# Patient Record
Sex: Female | Born: 1945 | Race: White | Hispanic: No | Marital: Married | Smoking: Former smoker
Health system: Southern US, Community
[De-identification: ages and names within clinical notes are randomized; demographics above are authoritative.]

## PROBLEM LIST (undated history)

## (undated) DIAGNOSIS — I639 Cerebral infarction, unspecified: Secondary | ICD-10-CM

## (undated) DIAGNOSIS — I10 Essential (primary) hypertension: Secondary | ICD-10-CM

## (undated) DIAGNOSIS — C801 Malignant (primary) neoplasm, unspecified: Secondary | ICD-10-CM

## (undated) DIAGNOSIS — E119 Type 2 diabetes mellitus without complications: Secondary | ICD-10-CM

## (undated) DIAGNOSIS — F32A Depression, unspecified: Secondary | ICD-10-CM

## (undated) DIAGNOSIS — M1990 Unspecified osteoarthritis, unspecified site: Secondary | ICD-10-CM

## (undated) DIAGNOSIS — E7800 Pure hypercholesterolemia, unspecified: Secondary | ICD-10-CM

## (undated) DIAGNOSIS — F329 Major depressive disorder, single episode, unspecified: Secondary | ICD-10-CM

## (undated) DIAGNOSIS — I499 Cardiac arrhythmia, unspecified: Secondary | ICD-10-CM

## (undated) HISTORY — PX: ABDOMINAL HYSTERECTOMY: SHX81

## (undated) HISTORY — PX: TUBAL LIGATION: SHX77

## (undated) HISTORY — PX: OTHER SURGICAL HISTORY: SHX169

## (undated) HISTORY — PX: APPENDECTOMY: SHX54

## (undated) HISTORY — PX: BUNIONECTOMY: SHX129

## (undated) HISTORY — PX: FRACTURE SURGERY: SHX138

---

## 2004-12-23 ENCOUNTER — Ambulatory Visit: Payer: Self-pay | Admitting: General Practice

## 2005-09-05 ENCOUNTER — Emergency Department: Payer: Self-pay | Admitting: Emergency Medicine

## 2005-09-19 ENCOUNTER — Emergency Department: Payer: Self-pay | Admitting: Emergency Medicine

## 2006-04-09 ENCOUNTER — Ambulatory Visit: Payer: Self-pay | Admitting: Family Medicine

## 2007-04-23 ENCOUNTER — Ambulatory Visit: Payer: Self-pay | Admitting: General Practice

## 2007-05-12 ENCOUNTER — Other Ambulatory Visit: Payer: Self-pay

## 2007-05-12 ENCOUNTER — Inpatient Hospital Stay: Payer: Self-pay | Admitting: Internal Medicine

## 2007-05-13 ENCOUNTER — Other Ambulatory Visit: Payer: Self-pay

## 2008-08-30 ENCOUNTER — Ambulatory Visit: Payer: Self-pay | Admitting: Obstetrics and Gynecology

## 2008-10-06 ENCOUNTER — Ambulatory Visit: Payer: Self-pay | Admitting: Unknown Physician Specialty

## 2009-01-05 ENCOUNTER — Ambulatory Visit: Payer: Self-pay | Admitting: Gynecologic Oncology

## 2009-01-23 ENCOUNTER — Ambulatory Visit: Payer: Self-pay | Admitting: Gynecologic Oncology

## 2009-02-05 ENCOUNTER — Ambulatory Visit: Payer: Self-pay | Admitting: Gynecologic Oncology

## 2009-02-13 ENCOUNTER — Ambulatory Visit: Payer: Self-pay | Admitting: Gynecologic Oncology

## 2009-03-07 ENCOUNTER — Ambulatory Visit: Payer: Self-pay | Admitting: Gynecologic Oncology

## 2009-04-03 ENCOUNTER — Ambulatory Visit: Payer: Self-pay | Admitting: Gynecologic Oncology

## 2009-07-06 ENCOUNTER — Ambulatory Visit: Payer: Self-pay | Admitting: Gynecologic Oncology

## 2009-08-05 ENCOUNTER — Ambulatory Visit: Payer: Self-pay | Admitting: Gynecologic Oncology

## 2009-09-13 ENCOUNTER — Ambulatory Visit: Payer: Self-pay

## 2010-05-28 ENCOUNTER — Ambulatory Visit: Payer: Self-pay | Admitting: Anesthesiology

## 2010-05-31 ENCOUNTER — Ambulatory Visit: Payer: Self-pay | Admitting: General Practice

## 2010-06-04 LAB — PATHOLOGY REPORT

## 2010-10-15 ENCOUNTER — Ambulatory Visit: Payer: Self-pay

## 2011-11-05 ENCOUNTER — Ambulatory Visit: Payer: Self-pay

## 2011-11-25 ENCOUNTER — Ambulatory Visit: Payer: Self-pay | Admitting: Otolaryngology

## 2011-12-01 ENCOUNTER — Ambulatory Visit: Payer: Self-pay | Admitting: Otolaryngology

## 2011-12-03 ENCOUNTER — Ambulatory Visit: Payer: Self-pay | Admitting: Otolaryngology

## 2011-12-04 ENCOUNTER — Ambulatory Visit: Payer: Self-pay | Admitting: Internal Medicine

## 2011-12-15 ENCOUNTER — Ambulatory Visit: Payer: Self-pay | Admitting: Internal Medicine

## 2011-12-15 LAB — CBC CANCER CENTER
Basophil #: 0 x10 3/mm (ref 0.0–0.1)
Eosinophil #: 0.1 x10 3/mm (ref 0.0–0.7)
HCT: 40.5 % (ref 35.0–47.0)
Lymphocyte #: 1 x10 3/mm (ref 1.0–3.6)
Lymphocyte %: 23.7 %
MCHC: 32 g/dL (ref 32.0–36.0)
MCV: 92 fL (ref 80–100)
Monocyte #: 0.6 x10 3/mm (ref 0.2–0.9)
Neutrophil #: 2.6 x10 3/mm (ref 1.4–6.5)
RDW: 13.8 % (ref 11.5–14.5)

## 2011-12-15 LAB — COMPREHENSIVE METABOLIC PANEL
Bilirubin,Total: 0.7 mg/dL (ref 0.2–1.0)
Calcium, Total: 9.3 mg/dL (ref 8.5–10.1)
Co2: 32 mmol/L (ref 21–32)
Potassium: 4 mmol/L (ref 3.5–5.1)
Sodium: 141 mmol/L (ref 136–145)
Total Protein: 7.3 g/dL (ref 6.4–8.2)

## 2011-12-15 LAB — LACTATE DEHYDROGENASE: LDH: 444 U/L — ABNORMAL HIGH (ref 81–234)

## 2011-12-18 ENCOUNTER — Ambulatory Visit: Payer: Self-pay | Admitting: Internal Medicine

## 2012-01-06 ENCOUNTER — Ambulatory Visit: Payer: Self-pay | Admitting: Internal Medicine

## 2012-01-12 ENCOUNTER — Ambulatory Visit: Payer: Self-pay | Admitting: Surgery

## 2012-01-14 LAB — COMPREHENSIVE METABOLIC PANEL
Anion Gap: 12 (ref 7–16)
BUN: 16 mg/dL (ref 7–18)
Calcium, Total: 9.5 mg/dL (ref 8.5–10.1)
Chloride: 99 mmol/L (ref 98–107)
Co2: 26 mmol/L (ref 21–32)
EGFR (African American): >60
EGFR (Non-African Amer.): >60
Glucose: 258 mg/dL — ABNORMAL HIGH (ref 65–99)
Potassium: 3.9 mmol/L (ref 3.5–5.1)
SGOT(AST): 30 U/L (ref 15–37)
SGPT (ALT): 21 U/L (ref 12–78)
Sodium: 137 mmol/L (ref 136–145)
Total Protein: 7 g/dL (ref 6.4–8.2)

## 2012-01-14 LAB — CBC CANCER CENTER
Basophil #: 0 x10 3/mm (ref 0.0–0.1)
Eosinophil %: 1.2 %
Lymphocyte #: 1 x10 3/mm (ref 1.0–3.6)
MCH: 28.8 pg (ref 26.0–34.0)
MCV: 91 fL (ref 80–100)
Monocyte %: 14.4 %
Neutrophil %: 69.8 %
Platelet: 179 x10 3/mm (ref 150–440)
RBC: 4.55 10*6/uL (ref 3.80–5.20)
RDW: 14.2 % (ref 11.5–14.5)
WBC: 6.9 x10 3/mm (ref 3.6–11.0)

## 2012-01-14 LAB — LACTATE DEHYDROGENASE: LDH: 928 U/L — ABNORMAL HIGH (ref 81–246)

## 2012-01-15 LAB — BASIC METABOLIC PANEL
BUN: 24 mg/dL — ABNORMAL HIGH (ref 7–18)
Creatinine: 0.78 mg/dL (ref 0.60–1.30)
EGFR (African American): >60
EGFR (Non-African Amer.): >60
Glucose: 432 mg/dL — ABNORMAL HIGH (ref 65–99)
Osmolality: 291 (ref 275–301)
Potassium: 4.8 mmol/L (ref 3.5–5.1)
Sodium: 134 mmol/L — ABNORMAL LOW (ref 136–145)

## 2012-01-15 LAB — PHOSPHORUS: Phosphorus: 3.2 mg/dL (ref 2.5–4.9)

## 2012-01-15 LAB — URIC ACID: Uric Acid: 2.5 mg/dL — ABNORMAL LOW (ref 2.6–6.0)

## 2012-01-16 LAB — BASIC METABOLIC PANEL
Anion Gap: 10 (ref 7–16)
BUN: 23 mg/dL — ABNORMAL HIGH (ref 7–18)
Calcium, Total: 9.5 mg/dL (ref 8.5–10.1)
Co2: 26 mmol/L (ref 21–32)
Creatinine: 0.92 mg/dL (ref 0.60–1.30)
EGFR (African American): >60
EGFR (Non-African Amer.): >60
Glucose: 384 mg/dL — ABNORMAL HIGH (ref 65–99)
Sodium: 133 mmol/L — ABNORMAL LOW (ref 136–145)

## 2012-01-16 LAB — URIC ACID: Uric Acid: 2.5 mg/dL — ABNORMAL LOW (ref 2.6–6.0)

## 2012-01-16 LAB — PHOSPHORUS: Phosphorus: 3 mg/dL (ref 2.5–4.9)

## 2012-01-19 LAB — BASIC METABOLIC PANEL
Anion Gap: 10 (ref 7–16)
BUN: 22 mg/dL — ABNORMAL HIGH (ref 7–18)
Calcium, Total: 8.5 mg/dL (ref 8.5–10.1)
Chloride: 96 mmol/L — ABNORMAL LOW (ref 98–107)
Co2: 28 mmol/L (ref 21–32)
Creatinine: 0.9 mg/dL (ref 0.60–1.30)
EGFR (African American): >60
Osmolality: 288 (ref 275–301)

## 2012-01-19 LAB — CBC CANCER CENTER
Basophil %: 0.7 %
Eosinophil #: 0 x10 3/mm (ref 0.0–0.7)
Eosinophil %: 0.2 %
HCT: 37.3 % (ref 35.0–47.0)
HGB: 11.8 g/dL — ABNORMAL LOW (ref 12.0–16.0)
Lymphocyte #: 0.8 x10 3/mm — ABNORMAL LOW (ref 1.0–3.6)
MCH: 29 pg (ref 26.0–34.0)
MCV: 91 fL (ref 80–100)
Monocyte #: 0.1 x10 3/mm — ABNORMAL LOW (ref 0.2–0.9)
Monocyte %: 1.1 %
Neutrophil #: 13 x10 3/mm — ABNORMAL HIGH (ref 1.4–6.5)
Platelet: 68 x10 3/mm — ABNORMAL LOW (ref 150–440)
RBC: 4.08 10*6/uL (ref 3.80–5.20)

## 2012-01-21 LAB — CBC CANCER CENTER
Basophil %: 1 %
Eosinophil #: 0.1 x10 3/mm (ref 0.0–0.7)
Eosinophil %: 12 %
HCT: 38.2 % (ref 35.0–47.0)
Lymphocyte #: 0.4 x10 3/mm — ABNORMAL LOW (ref 1.0–3.6)
Lymphocyte %: 68.4 %
MCH: 29 pg (ref 26.0–34.0)
MCV: 91 fL (ref 80–100)
Monocyte #: 0.1 x10 3/mm — ABNORMAL LOW (ref 0.2–0.9)
Monocyte %: 14 %
Neutrophil #: 0 x10 3/mm — ABNORMAL LOW (ref 1.4–6.5)
Neutrophil %: 4.6 %
Platelet: 42 x10 3/mm — ABNORMAL LOW (ref 150–440)
RBC: 4.18 10*6/uL (ref 3.80–5.20)
RDW: 13.8 % (ref 11.5–14.5)
WBC: 0.5 x10 3/mm — CL (ref 3.6–11.0)

## 2012-01-22 ENCOUNTER — Emergency Department: Payer: Self-pay | Admitting: Unknown Physician Specialty

## 2012-01-22 LAB — BASIC METABOLIC PANEL
BUN: 14 mg/dL (ref 7–18)
Calcium, Total: 8.9 mg/dL (ref 8.5–10.1)
Chloride: 98 mmol/L (ref 98–107)
Co2: 29 mmol/L (ref 21–32)
Creatinine: 0.93 mg/dL (ref 0.60–1.30)
EGFR (Non-African Amer.): >60
Glucose: 354 mg/dL — ABNORMAL HIGH (ref 65–99)
Potassium: 3.8 mmol/L (ref 3.5–5.1)
Sodium: 137 mmol/L (ref 136–145)

## 2012-01-22 LAB — CBC CANCER CENTER
Basophil #: 0 x10 3/mm (ref 0.0–0.1)
Eosinophil #: 0.1 x10 3/mm (ref 0.0–0.7)
Eosinophil %: 8.2 %
HCT: 38.3 % (ref 35.0–47.0)
Lymphocyte #: 0.4 x10 3/mm — ABNORMAL LOW (ref 1.0–3.6)
Lymphocyte %: 38.9 %
MCH: 29 pg (ref 26.0–34.0)
MCHC: 31.9 g/dL — ABNORMAL LOW (ref 32.0–36.0)
MCV: 91 fL (ref 80–100)
Neutrophil %: 24 %
Platelet: 42 x10 3/mm — ABNORMAL LOW (ref 150–440)
RBC: 4.21 10*6/uL (ref 3.80–5.20)

## 2012-01-22 LAB — CBC WITH DIFFERENTIAL/PLATELET
Bands: 8 %
HGB: 11.9 g/dL — ABNORMAL LOW (ref 12.0–16.0)
MCH: 29.9 pg (ref 26.0–34.0)
MCHC: 33.3 g/dL (ref 32.0–36.0)
Platelet: 43 10*3/uL — ABNORMAL LOW (ref 150–440)
RDW: 13.7 % (ref 11.5–14.5)
Segmented Neutrophils: 19 %

## 2012-01-22 LAB — COMPREHENSIVE METABOLIC PANEL
Albumin: 2.6 g/dL — ABNORMAL LOW (ref 3.4–5.0)
Anion Gap: 11 (ref 7–16)
BUN: 12 mg/dL (ref 7–18)
Calcium, Total: 8.6 mg/dL (ref 8.5–10.1)
Creatinine: 0.75 mg/dL (ref 0.60–1.30)
EGFR (African American): >60
EGFR (Non-African Amer.): >60
Glucose: 266 mg/dL — ABNORMAL HIGH (ref 65–99)
Osmolality: 288 (ref 275–301)
Potassium: 3.5 mmol/L (ref 3.5–5.1)
SGPT (ALT): 14 U/L (ref 12–78)
Sodium: 140 mmol/L (ref 136–145)
Total Protein: 5.4 g/dL — ABNORMAL LOW (ref 6.4–8.2)

## 2012-01-22 LAB — MAGNESIUM: Magnesium: 1.3 mg/dL — ABNORMAL LOW

## 2012-01-22 LAB — TSH: Thyroid Stimulating Horm: 0.526 u[IU]/mL

## 2012-01-22 LAB — PRO B NATRIURETIC PEPTIDE: B-Type Natriuretic Peptide: 76 pg/mL (ref 0–125)

## 2012-01-22 LAB — PROTIME-INR: INR: 1.1

## 2012-01-22 LAB — APTT: Activated PTT: 27.5 secs (ref 23.6–35.9)

## 2012-01-22 LAB — TROPONIN I: Troponin-I: <0.02 ng/mL

## 2012-01-22 LAB — LIPASE, BLOOD: Lipase: 179 U/L (ref 73–393)

## 2012-01-23 LAB — CBC CANCER CENTER
Basophil #: 0 x10 3/mm (ref 0.0–0.1)
Basophil %: 0.3 %
Eosinophil #: 0.1 x10 3/mm (ref 0.0–0.7)
Eosinophil %: 2.5 %
HGB: 12.6 g/dL (ref 12.0–16.0)
Lymphocyte %: 15.1 %
Neutrophil %: 72.5 %
RBC: 4.41 10*6/uL (ref 3.80–5.20)
WBC: 3.7 x10 3/mm (ref 3.6–11.0)

## 2012-01-23 LAB — MAGNESIUM: Magnesium: 1.5 mg/dL — ABNORMAL LOW

## 2012-01-26 LAB — CREATININE, SERUM
Creatinine: 0.9 mg/dL (ref 0.60–1.30)
EGFR (African American): >60
EGFR (Non-African Amer.): >60

## 2012-01-26 LAB — CBC CANCER CENTER
Basophil #: 0 x10 3/mm (ref 0.0–0.1)
Basophil %: 0.5 %
Eosinophil #: 0.1 x10 3/mm (ref 0.0–0.7)
Eosinophil %: 0.7 %
HCT: 37.5 % (ref 35.0–47.0)
HGB: 11.8 g/dL — ABNORMAL LOW (ref 12.0–16.0)
Lymphocyte #: 0.8 x10 3/mm — ABNORMAL LOW (ref 1.0–3.6)
Lymphocyte %: 10.5 %
MCH: 28.6 pg (ref 26.0–34.0)
MCHC: 31.4 g/dL — ABNORMAL LOW (ref 32.0–36.0)
Monocyte #: 1 x10 3/mm — ABNORMAL HIGH (ref 0.2–0.9)
Monocyte %: 12.8 %
Neutrophil #: 5.9 x10 3/mm (ref 1.4–6.5)
Neutrophil %: 75.5 %
Platelet: 169 x10 3/mm (ref 150–440)
RBC: 4.12 10*6/uL (ref 3.80–5.20)
WBC: 7.8 x10 3/mm (ref 3.6–11.0)

## 2012-01-26 LAB — POTASSIUM: Potassium: 4.1 mmol/L (ref 3.5–5.1)

## 2012-01-26 LAB — MAGNESIUM: Magnesium: 1.8 mg/dL

## 2012-02-04 LAB — CBC CANCER CENTER
Basophil #: 0.1 x10 3/mm (ref 0.0–0.1)
Basophil %: 1.3 %
Eosinophil #: 0 x10 3/mm (ref 0.0–0.7)
HCT: 36.9 % (ref 35.0–47.0)
HGB: 11.7 g/dL — ABNORMAL LOW (ref 12.0–16.0)
Lymphocyte %: 18.8 %
MCHC: 31.7 g/dL — ABNORMAL LOW (ref 32.0–36.0)
Monocyte #: 1 x10 3/mm — ABNORMAL HIGH (ref 0.2–0.9)
Monocyte %: 20.6 %
Neutrophil #: 2.9 x10 3/mm (ref 1.4–6.5)
Neutrophil %: 58.6 %
Platelet: 341 x10 3/mm (ref 150–440)
RBC: 4.04 10*6/uL (ref 3.80–5.20)
WBC: 5 x10 3/mm (ref 3.6–11.0)

## 2012-02-04 LAB — MAGNESIUM: Magnesium: 1.7 mg/dL — ABNORMAL LOW

## 2012-02-04 LAB — COMPREHENSIVE METABOLIC PANEL
Anion Gap: 12 (ref 7–16)
Calcium, Total: 8.8 mg/dL (ref 8.5–10.1)
Chloride: 100 mmol/L (ref 98–107)
Co2: 25 mmol/L (ref 21–32)
EGFR (African American): >60
Potassium: 4 mmol/L (ref 3.5–5.1)
SGOT(AST): 16 U/L (ref 15–37)
SGPT (ALT): 25 U/L (ref 12–78)

## 2012-02-04 LAB — LACTATE DEHYDROGENASE: LDH: 203 U/L (ref 81–246)

## 2012-02-06 ENCOUNTER — Ambulatory Visit: Payer: Self-pay | Admitting: Internal Medicine

## 2012-02-06 LAB — CBC CANCER CENTER
Basophil #: 0.1 x10 3/mm (ref 0.0–0.1)
Basophil %: 1.2 %
Eosinophil %: 0.5 %
HCT: 34.7 % — ABNORMAL LOW (ref 35.0–47.0)
HGB: 11.1 g/dL — ABNORMAL LOW (ref 12.0–16.0)
Lymphocyte #: 0.6 x10 3/mm — ABNORMAL LOW (ref 1.0–3.6)
MCHC: 31.9 g/dL — ABNORMAL LOW (ref 32.0–36.0)
MCV: 92 fL (ref 80–100)
Monocyte #: 0.9 x10 3/mm (ref 0.2–0.9)
Monocyte %: 18.9 %
Neutrophil #: 3.2 x10 3/mm (ref 1.4–6.5)
Neutrophil %: 66.3 %
Platelet: 309 x10 3/mm (ref 150–440)
RBC: 3.79 10*6/uL — ABNORMAL LOW (ref 3.80–5.20)
WBC: 4.8 x10 3/mm (ref 3.6–11.0)

## 2012-02-12 LAB — BASIC METABOLIC PANEL
Anion Gap: 12 (ref 7–16)
BUN: 22 mg/dL — ABNORMAL HIGH (ref 7–18)
Calcium, Total: 8.4 mg/dL — ABNORMAL LOW (ref 8.5–10.1)
Co2: 25 mmol/L (ref 21–32)
EGFR (African American): >60
Glucose: 321 mg/dL — ABNORMAL HIGH (ref 65–99)
Potassium: 3.6 mmol/L (ref 3.5–5.1)
Sodium: 136 mmol/L (ref 136–145)

## 2012-02-12 LAB — CBC CANCER CENTER
Basophil %: 0.5 %
Eosinophil #: 0 x10 3/mm (ref 0.0–0.7)
Eosinophil %: 0.3 %
HGB: 11.7 g/dL — ABNORMAL LOW (ref 12.0–16.0)
Lymphocyte %: 20.6 %
MCH: 28.8 pg (ref 26.0–34.0)
MCHC: 31.7 g/dL — ABNORMAL LOW (ref 32.0–36.0)
MCV: 91 fL (ref 80–100)
Monocyte #: 0 x10 3/mm — ABNORMAL LOW (ref 0.2–0.9)
Neutrophil %: 76.8 %
Platelet: 26 x10 3/mm — CL (ref 150–440)
RBC: 4.07 10*6/uL (ref 3.80–5.20)
RDW: 15.6 % — ABNORMAL HIGH (ref 11.5–14.5)
WBC: 2.4 x10 3/mm — ABNORMAL LOW (ref 3.6–11.0)

## 2012-02-12 LAB — MAGNESIUM: Magnesium: 1.6 mg/dL — ABNORMAL LOW

## 2012-02-13 LAB — CBC CANCER CENTER
Basophil #: 0 x10 3/mm (ref 0.0–0.1)
Basophil %: 0.2 %
HCT: 34.8 % — ABNORMAL LOW (ref 35.0–47.0)
HGB: 10.9 g/dL — ABNORMAL LOW (ref 12.0–16.0)
Lymphocyte #: 0.3 x10 3/mm — ABNORMAL LOW (ref 1.0–3.6)
Lymphocyte %: 64 %
MCHC: 31.5 g/dL — ABNORMAL LOW (ref 32.0–36.0)
MCV: 92 fL (ref 80–100)
Monocyte %: 22.6 %
Neutrophil #: 0 x10 3/mm — ABNORMAL LOW (ref 1.4–6.5)
Platelet: 13 x10 3/mm — CL (ref 150–440)
RDW: 15.6 % — ABNORMAL HIGH (ref 11.5–14.5)
WBC: 0.5 x10 3/mm — CL (ref 3.6–11.0)

## 2012-02-14 ENCOUNTER — Ambulatory Visit: Payer: Self-pay | Admitting: Internal Medicine

## 2012-02-14 LAB — PLATELET COUNT: Platelet: 26 10*3/uL — CL (ref 150–440)

## 2012-02-15 ENCOUNTER — Other Ambulatory Visit: Payer: Self-pay | Admitting: Internal Medicine

## 2012-02-15 LAB — CBC WITH DIFFERENTIAL/PLATELET
Basophil #: 0 10*3/uL (ref 0.0–0.1)
Basophil %: 0.5 %
Eosinophil #: 0.1 10*3/uL (ref 0.0–0.7)
HCT: 33.7 % — ABNORMAL LOW (ref 35.0–47.0)
HGB: 11.1 g/dL — ABNORMAL LOW (ref 12.0–16.0)
Lymphocyte %: 13.5 %
MCH: 29.6 pg (ref 26.0–34.0)
MCHC: 33 g/dL (ref 32.0–36.0)
Monocyte %: 12 %
Neutrophil #: 3.2 10*3/uL (ref 1.4–6.5)
Neutrophil %: 71.3 %
Platelet: 31 10*3/uL — ABNORMAL LOW (ref 150–440)
WBC: 4.5 10*3/uL (ref 3.6–11.0)

## 2012-02-16 LAB — CBC CANCER CENTER
Basophil #: 0 x10 3/mm (ref 0.0–0.1)
Basophil %: 0.5 %
HGB: 11.1 g/dL — ABNORMAL LOW (ref 12.0–16.0)
Lymphocyte #: 0.6 x10 3/mm — ABNORMAL LOW (ref 1.0–3.6)
Lymphocyte %: 11.3 %
MCH: 29 pg (ref 26.0–34.0)
MCHC: 31.6 g/dL — ABNORMAL LOW (ref 32.0–36.0)
MCV: 92 fL (ref 80–100)
Neutrophil #: 4.2 x10 3/mm (ref 1.4–6.5)
Neutrophil %: 73.8 %
Platelet: 48 x10 3/mm — ABNORMAL LOW (ref 150–440)
RBC: 3.83 10*6/uL (ref 3.80–5.20)
RDW: 15.4 % — ABNORMAL HIGH (ref 11.5–14.5)

## 2012-02-22 ENCOUNTER — Inpatient Hospital Stay: Payer: Self-pay | Admitting: Internal Medicine

## 2012-02-22 LAB — COMPREHENSIVE METABOLIC PANEL
Albumin: 3 g/dL — ABNORMAL LOW (ref 3.4–5.0)
Anion Gap: 6 — ABNORMAL LOW (ref 7–16)
BUN: 9 mg/dL (ref 7–18)
Calcium, Total: 8.2 mg/dL — ABNORMAL LOW (ref 8.5–10.1)
Glucose: 520 mg/dL (ref 65–99)
Osmolality: 298 (ref 275–301)
Potassium: 4 mmol/L (ref 3.5–5.1)
SGOT(AST): 20 U/L (ref 15–37)
Sodium: 138 mmol/L (ref 136–145)
Total Protein: 5.8 g/dL — ABNORMAL LOW (ref 6.4–8.2)

## 2012-02-22 LAB — CBC WITH DIFFERENTIAL/PLATELET
Basophil %: 0.9 %
Eosinophil #: 0 10*3/uL (ref 0.0–0.7)
HCT: 31.8 % — ABNORMAL LOW (ref 35.0–47.0)
HGB: 11.1 g/dL — ABNORMAL LOW (ref 12.0–16.0)
Lymphocyte %: 7.5 %
MCHC: 34.9 g/dL (ref 32.0–36.0)
Monocyte %: 14.6 %
Neutrophil #: 3.9 10*3/uL (ref 1.4–6.5)
WBC: 5.1 10*3/uL (ref 3.6–11.0)

## 2012-02-22 LAB — URINE PH
Ph: 5 (ref 4.5–8.0)
Ph: 7 (ref 4.5–8.0)

## 2012-02-22 LAB — MAGNESIUM: Magnesium: 2 mg/dL

## 2012-02-23 LAB — URINE PH
Ph: 7 (ref 4.5–8.0)
Ph: 9 (ref 4.5–8.0)
Ph: 9 (ref 4.5–8.0)
Ph: 8 (ref 4.5–8.0)
Ph: 8 (ref 4.5–8.0)

## 2012-02-23 LAB — CREATININE, SERUM: EGFR (African American): >60

## 2012-02-24 LAB — URINE PH
Ph: 7 (ref 4.5–8.0)
Ph: 8 (ref 4.5–8.0)
Ph: 8 (ref 4.5–8.0)
Ph: 7 (ref 4.5–8.0)
Ph: 7 (ref 4.5–8.0)
Ph: 6 (ref 4.5–8.0)
Ph: 6 (ref 4.5–8.0)
Ph: 8 (ref 4.5–8.0)

## 2012-02-24 LAB — BASIC METABOLIC PANEL
BUN: 10 mg/dL (ref 7–18)
Chloride: 107 mmol/L (ref 98–107)
Co2: 27 mmol/L (ref 21–32)
Creatinine: 0.47 mg/dL — ABNORMAL LOW (ref 0.60–1.30)
EGFR (Non-African Amer.): >60
Glucose: 273 mg/dL — ABNORMAL HIGH (ref 65–99)
Osmolality: 286 (ref 275–301)
Potassium: 4.1 mmol/L (ref 3.5–5.1)
Sodium: 139 mmol/L (ref 136–145)

## 2012-02-25 LAB — CBC WITH DIFFERENTIAL/PLATELET
Basophil %: 0.1 %
Eosinophil %: 0 %
HCT: 29.8 % — ABNORMAL LOW (ref 35.0–47.0)
HGB: 9.9 g/dL — ABNORMAL LOW (ref 12.0–16.0)
Lymphocyte %: 8.3 %
Monocyte %: 8.6 %
Neutrophil %: 83 %
Platelet: 284 10*3/uL (ref 150–440)
RDW: 17.2 % — ABNORMAL HIGH (ref 11.5–14.5)
WBC: 6.2 10*3/uL (ref 3.6–11.0)

## 2012-02-25 LAB — BASIC METABOLIC PANEL
BUN: 9 mg/dL (ref 7–18)
Calcium, Total: 8.1 mg/dL — ABNORMAL LOW (ref 8.5–10.1)
Co2: 32 mmol/L (ref 21–32)
Creatinine: 0.52 mg/dL — ABNORMAL LOW (ref 0.60–1.30)
EGFR (Non-African Amer.): >60
Glucose: 310 mg/dL — ABNORMAL HIGH (ref 65–99)
Potassium: 3.9 mmol/L (ref 3.5–5.1)
Sodium: 141 mmol/L (ref 136–145)

## 2012-02-25 LAB — URINE PH
Ph: 7 (ref 4.5–8.0)
Ph: 7 (ref 4.5–8.0)
Ph: 7 (ref 4.5–8.0)
Ph: 8 (ref 4.5–8.0)
Ph: 8 (ref 4.5–8.0)
Ph: 8 (ref 4.5–8.0)
Ph: 8 (ref 4.5–8.0)
Ph: 9 (ref 4.5–8.0)
Ph: 9 (ref 4.5–8.0)
Ph: 9 (ref 4.5–8.0)
Ph: 9 (ref 4.5–8.0)

## 2012-02-25 LAB — HEPATIC FUNCTION PANEL A (ARMC)
Albumin: 2.8 g/dL — ABNORMAL LOW (ref 3.4–5.0)
Bilirubin, Direct: <0.05 mg/dL (ref 0.00–0.20)
SGOT(AST): 33 U/L (ref 15–37)
SGPT (ALT): 38 U/L (ref 12–78)

## 2012-02-25 LAB — PROTIME-INR: Prothrombin Time: 14.4 secs (ref 11.5–14.7)

## 2012-02-25 LAB — APTT: Activated PTT: 27.3 secs (ref 23.6–35.9)

## 2012-02-25 LAB — MAGNESIUM: Magnesium: 2.1 mg/dL

## 2012-02-26 LAB — URINE PH
Ph: 8 (ref 4.5–8.0)
Ph: 8 (ref 4.5–8.0)
Ph: 9 (ref 4.5–8.0)
Ph: 9 (ref 4.5–8.0)
Ph: 9 (ref 4.5–8.0)

## 2012-02-26 LAB — BASIC METABOLIC PANEL
Anion Gap: 6 — ABNORMAL LOW (ref 7–16)
BUN: 6 mg/dL — ABNORMAL LOW (ref 7–18)
Chloride: 104 mmol/L (ref 98–107)
Co2: 30 mmol/L (ref 21–32)
Creatinine: 0.49 mg/dL — ABNORMAL LOW (ref 0.60–1.30)
Potassium: 3.7 mmol/L (ref 3.5–5.1)
Sodium: 140 mmol/L (ref 136–145)

## 2012-03-01 LAB — CBC CANCER CENTER
Basophil #: 0 x10 3/mm (ref 0.0–0.1)
Basophil %: 0.8 %
Eosinophil #: 0.1 x10 3/mm (ref 0.0–0.7)
Eosinophil %: 1.1 %
HCT: 30.9 % — ABNORMAL LOW (ref 35.0–47.0)
Lymphocyte #: 0.6 x10 3/mm — ABNORMAL LOW (ref 1.0–3.6)
MCV: 92 fL (ref 80–100)
Monocyte #: 1.2 x10 3/mm — ABNORMAL HIGH (ref 0.2–0.9)
Monocyte %: 19.4 %
Neutrophil #: 4.2 x10 3/mm (ref 1.4–6.5)
RBC: 3.36 10*6/uL — ABNORMAL LOW (ref 3.80–5.20)
RDW: 17.4 % — ABNORMAL HIGH (ref 11.5–14.5)
WBC: 6.1 x10 3/mm (ref 3.6–11.0)

## 2012-03-01 LAB — COMPREHENSIVE METABOLIC PANEL
Albumin: 2.7 g/dL — ABNORMAL LOW (ref 3.4–5.0)
Alkaline Phosphatase: 65 U/L (ref 50–136)
Anion Gap: 10 (ref 7–16)
BUN: 9 mg/dL (ref 7–18)
Bilirubin,Total: 0.2 mg/dL (ref 0.2–1.0)
Chloride: 105 mmol/L (ref 98–107)
Co2: 26 mmol/L (ref 21–32)
Creatinine: 0.73 mg/dL (ref 0.60–1.30)
EGFR (African American): >60
EGFR (Non-African Amer.): >60
Glucose: 220 mg/dL — ABNORMAL HIGH (ref 65–99)
Osmolality: 287 (ref 275–301)
Potassium: 3.8 mmol/L (ref 3.5–5.1)
SGPT (ALT): 40 U/L (ref 12–78)
Sodium: 141 mmol/L (ref 136–145)
Total Protein: 5.7 g/dL — ABNORMAL LOW (ref 6.4–8.2)

## 2012-03-01 LAB — LACTATE DEHYDROGENASE: LDH: 198 U/L (ref 81–246)

## 2012-03-01 LAB — MAGNESIUM: Magnesium: 1.9 mg/dL

## 2012-03-03 LAB — CBC CANCER CENTER
Basophil #: 0 x10 3/mm (ref 0.0–0.1)
Eosinophil %: 0.6 %
HCT: 31.5 % — ABNORMAL LOW (ref 35.0–47.0)
Lymphocyte #: 0.5 x10 3/mm — ABNORMAL LOW (ref 1.0–3.6)
Lymphocyte %: 5.6 %
MCH: 30.2 pg (ref 26.0–34.0)
MCHC: 33.7 g/dL (ref 32.0–36.0)
MCV: 90 fL (ref 80–100)
Monocyte %: 20.7 %
Platelet: 139 x10 3/mm — ABNORMAL LOW (ref 150–440)
RDW: 17.7 % — ABNORMAL HIGH (ref 11.5–14.5)
WBC: 8.7 x10 3/mm (ref 3.6–11.0)

## 2012-03-05 LAB — CBC CANCER CENTER
Eosinophil #: 0.1 x10 3/mm (ref 0.0–0.7)
Eosinophil %: 1 %
Lymphocyte #: 0.6 x10 3/mm — ABNORMAL LOW (ref 1.0–3.6)
MCH: 29.8 pg (ref 26.0–34.0)
MCHC: 32.6 g/dL (ref 32.0–36.0)
MCV: 91 fL (ref 80–100)
Monocyte #: 1.5 x10 3/mm — ABNORMAL HIGH (ref 0.2–0.9)
Neutrophil %: 65.1 %
Platelet: 135 x10 3/mm — ABNORMAL LOW (ref 150–440)
RBC: 3.6 10*6/uL — ABNORMAL LOW (ref 3.80–5.20)
RDW: 17.7 % — ABNORMAL HIGH (ref 11.5–14.5)
WBC: 6.3 x10 3/mm (ref 3.6–11.0)

## 2012-03-05 LAB — CREATININE, SERUM
Creatinine: 0.82 mg/dL (ref 0.60–1.30)
EGFR (African American): >60
EGFR (Non-African Amer.): >60

## 2012-03-07 ENCOUNTER — Ambulatory Visit: Payer: Self-pay | Admitting: Internal Medicine

## 2012-03-08 LAB — COMPREHENSIVE METABOLIC PANEL
Albumin: 2.8 g/dL — ABNORMAL LOW (ref 3.4–5.0)
Alkaline Phosphatase: 88 U/L (ref 50–136)
BUN: 8 mg/dL (ref 7–18)
Co2: 27 mmol/L (ref 21–32)
Glucose: 62 mg/dL — ABNORMAL LOW (ref 65–99)
Osmolality: 278 (ref 275–301)
Potassium: 3.4 mmol/L — ABNORMAL LOW (ref 3.5–5.1)
SGOT(AST): 18 U/L (ref 15–37)
SGPT (ALT): 31 U/L (ref 12–78)
Total Protein: 6.7 g/dL (ref 6.4–8.2)

## 2012-03-08 LAB — CBC CANCER CENTER
Basophil %: 0.8 %
Eosinophil #: 0.1 x10 3/mm (ref 0.0–0.7)
Eosinophil %: 1.4 %
Lymphocyte #: 1 x10 3/mm (ref 1.0–3.6)
MCH: 29.7 pg (ref 26.0–34.0)
MCHC: 33 g/dL (ref 32.0–36.0)
MCV: 90 fL (ref 80–100)
Monocyte #: 1.8 x10 3/mm — ABNORMAL HIGH (ref 0.2–0.9)
Neutrophil #: 3.7 x10 3/mm (ref 1.4–6.5)
Neutrophil %: 55.9 %
RBC: 3.61 10*6/uL — ABNORMAL LOW (ref 3.80–5.20)
RDW: 17.6 % — ABNORMAL HIGH (ref 11.5–14.5)
WBC: 6.7 x10 3/mm (ref 3.6–11.0)

## 2012-03-14 ENCOUNTER — Other Ambulatory Visit: Payer: Self-pay | Admitting: Internal Medicine

## 2012-03-14 LAB — CBC WITH DIFFERENTIAL/PLATELET
Bands: 7 %
HGB: 11.2 g/dL — ABNORMAL LOW (ref 12.0–16.0)
MCH: 30 pg (ref 26.0–34.0)
MCHC: 33.1 g/dL (ref 32.0–36.0)
Platelet: 85 10*3/uL — ABNORMAL LOW (ref 150–440)
RBC: 3.73 10*6/uL — ABNORMAL LOW (ref 3.80–5.20)
RDW: 17.3 % — ABNORMAL HIGH (ref 11.5–14.5)
Segmented Neutrophils: 88 %

## 2012-03-15 LAB — CBC CANCER CENTER
Basophil %: 0.2 %
HCT: 30.3 % — ABNORMAL LOW (ref 35.0–47.0)
Lymphocyte %: 29.1 %
MCH: 30.3 pg (ref 26.0–34.0)
MCHC: 33.5 g/dL (ref 32.0–36.0)
MCV: 90 fL (ref 80–100)
Monocyte %: 2.8 %
Neutrophil #: 0.7 x10 3/mm — ABNORMAL LOW (ref 1.4–6.5)
Neutrophil %: 60 %
RBC: 3.35 10*6/uL — ABNORMAL LOW (ref 3.80–5.20)
WBC: 1.2 x10 3/mm — CL (ref 3.6–11.0)

## 2012-03-16 LAB — CBC CANCER CENTER
Basophil %: 0.8 %
Comment - H1-Com2: NORMAL
Eosinophil #: 0.1 x10 3/mm (ref 0.0–0.7)
Eosinophil: 30 %
HCT: 32.8 % — ABNORMAL LOW (ref 35.0–47.0)
HGB: 11 g/dL — ABNORMAL LOW (ref 12.0–16.0)
Lymphocyte #: 0.3 x10 3/mm — ABNORMAL LOW (ref 1.0–3.6)
Lymphocyte %: 57.6 %
MCV: 90 fL (ref 80–100)
Monocyte %: 12.9 %
Monocytes: 3 %
Neutrophil #: 0 x10 3/mm — ABNORMAL LOW (ref 1.4–6.5)
Other Cells Blood: 4 %
RBC: 3.66 10*6/uL — ABNORMAL LOW (ref 3.80–5.20)
RDW: 16.6 % — ABNORMAL HIGH (ref 11.5–14.5)
Variant Lymphocyte: 5 %
WBC: 0.6 x10 3/mm — CL (ref 3.6–11.0)

## 2012-03-16 LAB — BASIC METABOLIC PANEL
Anion Gap: 6 — ABNORMAL LOW (ref 7–16)
BUN: 15 mg/dL (ref 7–18)
Calcium, Total: 8.7 mg/dL (ref 8.5–10.1)
Chloride: 97 mmol/L — ABNORMAL LOW (ref 98–107)
Co2: 32 mmol/L (ref 21–32)
Osmolality: 281 (ref 275–301)
Potassium: 4.2 mmol/L (ref 3.5–5.1)
Sodium: 135 mmol/L — ABNORMAL LOW (ref 136–145)

## 2012-03-17 LAB — CBC CANCER CENTER
Basophil #: 0 x10 3/mm (ref 0.0–0.1)
Lymphocyte #: 0.3 x10 3/mm — ABNORMAL LOW (ref 1.0–3.6)
Lymphocyte %: 34.6 %
MCV: 90 fL (ref 80–100)
Monocyte %: 29.5 %
Neutrophil #: 0.1 x10 3/mm — ABNORMAL LOW (ref 1.4–6.5)
Neutrophil %: 11.8 %
Platelet: 24 x10 3/mm — CL (ref 150–440)
RBC: 3.45 10*6/uL — ABNORMAL LOW (ref 3.80–5.20)
RDW: 16.7 % — ABNORMAL HIGH (ref 11.5–14.5)

## 2012-03-18 LAB — CBC CANCER CENTER
Bands: 17 %
Eosinophil: 7 %
HGB: 9.6 g/dL — ABNORMAL LOW (ref 12.0–16.0)
MCH: 30 pg (ref 26.0–34.0)
MCHC: 33.3 g/dL (ref 32.0–36.0)
MCV: 90 fL (ref 80–100)
Metamyelocyte: 5 %
Monocytes: 10 %
Other Cells Blood: 4 %
Platelet: 25 x10 3/mm — CL (ref 150–440)
RBC: 3.21 10*6/uL — ABNORMAL LOW (ref 3.80–5.20)
RDW: 16.8 % — ABNORMAL HIGH (ref 11.5–14.5)
Segmented Neutrophils: 28 %

## 2012-03-29 LAB — COMPREHENSIVE METABOLIC PANEL
Alkaline Phosphatase: 78 U/L (ref 50–136)
Anion Gap: 9 (ref 7–16)
Bilirubin,Total: 0.4 mg/dL (ref 0.2–1.0)
Calcium, Total: 8.8 mg/dL (ref 8.5–10.1)
Co2: 28 mmol/L (ref 21–32)
EGFR (African American): >60
EGFR (Non-African Amer.): >60
SGPT (ALT): 20 U/L (ref 12–78)

## 2012-03-29 LAB — CBC CANCER CENTER
Basophil %: 1 %
Eosinophil #: 0 x10 3/mm (ref 0.0–0.7)
Eosinophil %: 0.2 %
HGB: 11.2 g/dL — ABNORMAL LOW (ref 12.0–16.0)
Lymphocyte #: 0.6 x10 3/mm — ABNORMAL LOW (ref 1.0–3.6)
MCH: 30.3 pg (ref 26.0–34.0)
MCHC: 33.3 g/dL (ref 32.0–36.0)
MCV: 91 fL (ref 80–100)
Monocyte #: 0.7 x10 3/mm (ref 0.2–0.9)
Neutrophil %: 67 %
RBC: 3.69 10*6/uL — ABNORMAL LOW (ref 3.80–5.20)

## 2012-03-29 LAB — LACTATE DEHYDROGENASE: LDH: 184 U/L (ref 81–246)

## 2012-04-01 LAB — CBC CANCER CENTER
Basophil #: 0.1 x10 3/mm (ref 0.0–0.1)
Eosinophil #: 0 x10 3/mm (ref 0.0–0.7)
Eosinophil %: 0.2 %
HGB: 10.5 g/dL — ABNORMAL LOW (ref 12.0–16.0)
Lymphocyte #: 0.7 x10 3/mm — ABNORMAL LOW (ref 1.0–3.6)
MCH: 30.5 pg (ref 26.0–34.0)
MCV: 91 fL (ref 80–100)
Monocyte #: 1.1 x10 3/mm — ABNORMAL HIGH (ref 0.2–0.9)
Neutrophil #: 2.7 x10 3/mm (ref 1.4–6.5)
Neutrophil %: 59.8 %
Platelet: 252 x10 3/mm (ref 150–440)
RBC: 3.43 10*6/uL — ABNORMAL LOW (ref 3.80–5.20)
RDW: 18.6 % — ABNORMAL HIGH (ref 11.5–14.5)

## 2012-04-06 LAB — CBC CANCER CENTER
HCT: 32.5 % — ABNORMAL LOW (ref 35.0–47.0)
HGB: 10.5 g/dL — ABNORMAL LOW (ref 12.0–16.0)
Lymphocytes: 4 %
MCH: 30.2 pg (ref 26.0–34.0)
MCHC: 32.4 g/dL (ref 32.0–36.0)
MCV: 93 fL (ref 80–100)
Platelet: 31 x10 3/mm — ABNORMAL LOW (ref 150–440)
RBC: 3.49 10*6/uL — ABNORMAL LOW (ref 3.80–5.20)
RDW: 17.8 % — ABNORMAL HIGH (ref 11.5–14.5)
Segmented Neutrophils: 96 %
WBC: 14.2 x10 3/mm — ABNORMAL HIGH (ref 3.6–11.0)

## 2012-04-07 ENCOUNTER — Ambulatory Visit: Payer: Self-pay | Admitting: Internal Medicine

## 2012-04-12 LAB — CBC CANCER CENTER
Basophil #: 0 x10 3/mm (ref 0.0–0.1)
Basophil %: 0.5 %
Eosinophil #: 0 x10 3/mm (ref 0.0–0.7)
Eosinophil %: 0.4 %
HGB: 9.8 g/dL — ABNORMAL LOW (ref 12.0–16.0)
MCH: 29.9 pg (ref 26.0–34.0)
MCV: 91 fL (ref 80–100)
Monocyte %: 17.7 %
Neutrophil #: 4.3 x10 3/mm (ref 1.4–6.5)
Platelet: 45 x10 3/mm — ABNORMAL LOW (ref 150–440)
RBC: 3.29 10*6/uL — ABNORMAL LOW (ref 3.80–5.20)
RDW: 16.2 % — ABNORMAL HIGH (ref 11.5–14.5)
WBC: 6.1 x10 3/mm (ref 3.6–11.0)

## 2012-04-15 LAB — CBC CANCER CENTER
Basophil #: 0 x10 3/mm (ref 0.0–0.1)
Eosinophil #: 0 x10 3/mm (ref 0.0–0.7)
Eosinophil %: 0.4 %
HCT: 31 % — ABNORMAL LOW (ref 35.0–47.0)
HGB: 10.3 g/dL — ABNORMAL LOW (ref 12.0–16.0)
Lymphocyte %: 6.5 %
MCH: 30.3 pg (ref 26.0–34.0)
MCV: 91 fL (ref 80–100)
Monocyte #: 0.8 x10 3/mm (ref 0.2–0.9)
Neutrophil #: 3.4 x10 3/mm (ref 1.4–6.5)
Platelet: 126 x10 3/mm — ABNORMAL LOW (ref 150–440)
RBC: 3.39 10*6/uL — ABNORMAL LOW (ref 3.80–5.20)
RDW: 17.3 % — ABNORMAL HIGH (ref 11.5–14.5)
WBC: 4.6 x10 3/mm (ref 3.6–11.0)

## 2012-04-15 LAB — COMPREHENSIVE METABOLIC PANEL
Albumin: 3 g/dL — ABNORMAL LOW (ref 3.4–5.0)
Anion Gap: 9 (ref 7–16)
BUN: 9 mg/dL (ref 7–18)
Calcium, Total: 8.5 mg/dL (ref 8.5–10.1)
Chloride: 99 mmol/L (ref 98–107)
Co2: 27 mmol/L (ref 21–32)
Creatinine: 0.88 mg/dL (ref 0.60–1.30)
EGFR (African American): >60
EGFR (Non-African Amer.): >60
Glucose: 186 mg/dL — ABNORMAL HIGH (ref 65–99)
Osmolality: 274 (ref 275–301)
SGPT (ALT): 22 U/L (ref 12–78)
Total Protein: 6.1 g/dL — ABNORMAL LOW (ref 6.4–8.2)

## 2012-04-19 LAB — CREATININE, SERUM
Creatinine: 0.74 mg/dL (ref 0.60–1.30)
EGFR (African American): >60
EGFR (Non-African Amer.): >60

## 2012-04-19 LAB — CBC CANCER CENTER
Eosinophil #: 0 x10 3/mm (ref 0.0–0.7)
Eosinophil %: 0.4 %
HCT: 30.1 % — ABNORMAL LOW (ref 35.0–47.0)
Lymphocyte #: 0.7 x10 3/mm — ABNORMAL LOW (ref 1.0–3.6)
MCH: 30.7 pg (ref 26.0–34.0)
MCV: 92 fL (ref 80–100)
Monocyte %: 18.5 %
Neutrophil #: 1.8 x10 3/mm (ref 1.4–6.5)
Neutrophil %: 57.9 %
RBC: 3.29 10*6/uL — ABNORMAL LOW (ref 3.80–5.20)
RDW: 17.6 % — ABNORMAL HIGH (ref 11.5–14.5)
WBC: 3.1 x10 3/mm — ABNORMAL LOW (ref 3.6–11.0)

## 2012-04-22 LAB — COMPREHENSIVE METABOLIC PANEL
Chloride: 103 mmol/L (ref 98–107)
Co2: 28 mmol/L (ref 21–32)
Creatinine: 0.77 mg/dL (ref 0.60–1.30)
EGFR (African American): >60
EGFR (Non-African Amer.): >60
SGOT(AST): 22 U/L (ref 15–37)
Sodium: 139 mmol/L (ref 136–145)

## 2012-04-22 LAB — CBC CANCER CENTER
Basophil #: 0 x10 3/mm (ref 0.0–0.1)
Eosinophil %: 0.2 %
HCT: 31.3 % — ABNORMAL LOW (ref 35.0–47.0)
HGB: 10.5 g/dL — ABNORMAL LOW (ref 12.0–16.0)
MCHC: 33.5 g/dL (ref 32.0–36.0)
MCV: 92 fL (ref 80–100)
Monocyte #: 0.7 x10 3/mm (ref 0.2–0.9)
Monocyte %: 15.8 %
Neutrophil #: 3 x10 3/mm (ref 1.4–6.5)
Neutrophil %: 63.6 %
Platelet: 201 x10 3/mm (ref 150–440)
RBC: 3.41 10*6/uL — ABNORMAL LOW (ref 3.80–5.20)
RDW: 18.1 % — ABNORMAL HIGH (ref 11.5–14.5)
WBC: 4.7 x10 3/mm (ref 3.6–11.0)

## 2012-04-22 LAB — LACTATE DEHYDROGENASE: LDH: 199 U/L (ref 81–246)

## 2012-04-29 LAB — CBC CANCER CENTER
Basophil #: 0 x10 3/mm (ref 0.0–0.1)
Basophil %: 0.3 %
Eosinophil #: 0 x10 3/mm (ref 0.0–0.7)
HCT: 27.9 % — ABNORMAL LOW (ref 35.0–47.0)
Lymphocyte #: 0.4 x10 3/mm — ABNORMAL LOW (ref 1.0–3.6)
MCH: 30.8 pg (ref 26.0–34.0)
MCHC: 33.2 g/dL (ref 32.0–36.0)
MCV: 93 fL (ref 80–100)
Monocyte %: 1 %
Neutrophil #: 1.7 x10 3/mm (ref 1.4–6.5)
Neutrophil %: 80.1 %
Platelet: 19 x10 3/mm — CL (ref 150–440)
RDW: 17.8 % — ABNORMAL HIGH (ref 11.5–14.5)
WBC: 2.1 x10 3/mm — ABNORMAL LOW (ref 3.6–11.0)

## 2012-04-30 LAB — CBC CANCER CENTER
Basophil #: 0 x10 3/mm (ref 0.0–0.1)
Basophil %: 0 %
Eosinophil #: 0 x10 3/mm (ref 0.0–0.7)
Eosinophil %: 4.2 %
MCV: 92 fL (ref 80–100)
Monocyte #: 0 x10 3/mm — ABNORMAL LOW (ref 0.2–0.9)
Monocyte %: 10.3 %
Neutrophil %: 6.1 %
RBC: 3.03 10*6/uL — ABNORMAL LOW (ref 3.80–5.20)

## 2012-05-03 LAB — CBC CANCER CENTER
Eosinophil #: 0 x10 3/mm (ref 0.0–0.7)
Eosinophil %: 0.1 %
MCHC: 32.5 g/dL (ref 32.0–36.0)
Monocyte #: 0.3 x10 3/mm (ref 0.2–0.9)
Monocyte %: 7.3 %
Platelet: 17 x10 3/mm — CL (ref 150–440)
RDW: 17.2 % — ABNORMAL HIGH (ref 11.5–14.5)
WBC: 4.1 x10 3/mm (ref 3.6–11.0)

## 2012-05-06 LAB — CBC CANCER CENTER
Basophil #: 0 x10 3/mm (ref 0.0–0.1)
Basophil %: 0.3 %
HCT: 23 % — ABNORMAL LOW (ref 35.0–47.0)
HGB: 7.5 g/dL — ABNORMAL LOW (ref 12.0–16.0)
Lymphocyte #: 0.3 x10 3/mm — ABNORMAL LOW (ref 1.0–3.6)
Lymphocyte %: 5.9 %
MCH: 30.1 pg (ref 26.0–34.0)
Neutrophil #: 3.9 x10 3/mm (ref 1.4–6.5)
Neutrophil %: 76.2 %
RBC: 2.47 10*6/uL — ABNORMAL LOW (ref 3.80–5.20)
RDW: 17.2 % — ABNORMAL HIGH (ref 11.5–14.5)
WBC: 5.1 x10 3/mm (ref 3.6–11.0)

## 2012-05-07 LAB — CBC CANCER CENTER
Basophil %: 0.2 %
Eosinophil %: 0 %
Lymphocyte %: 7 %
MCH: 30.5 pg (ref 26.0–34.0)
MCHC: 33.3 g/dL (ref 32.0–36.0)
Monocyte %: 22.8 %
Neutrophil %: 70 %
WBC: 5.6 x10 3/mm (ref 3.6–11.0)

## 2012-05-07 LAB — COMPREHENSIVE METABOLIC PANEL
Albumin: 2.5 g/dL — ABNORMAL LOW (ref 3.4–5.0)
Alkaline Phosphatase: 84 U/L (ref 50–136)
Anion Gap: 5 — ABNORMAL LOW (ref 7–16)
BUN: 10 mg/dL (ref 7–18)
Bilirubin,Total: 0.4 mg/dL (ref 0.2–1.0)
Calcium, Total: 8.4 mg/dL — ABNORMAL LOW (ref 8.5–10.1)
Co2: 33 mmol/L — ABNORMAL HIGH (ref 21–32)
Creatinine: 0.69 mg/dL (ref 0.60–1.30)
EGFR (African American): >60
EGFR (Non-African Amer.): >60
Potassium: 3.6 mmol/L (ref 3.5–5.1)
SGOT(AST): 14 U/L — ABNORMAL LOW (ref 15–37)
SGPT (ALT): 16 U/L (ref 12–78)
Total Protein: 5.7 g/dL — ABNORMAL LOW (ref 6.4–8.2)

## 2012-05-07 LAB — MAGNESIUM: Magnesium: 1.8 mg/dL

## 2012-05-07 LAB — LACTATE DEHYDROGENASE: LDH: 169 U/L (ref 81–246)

## 2012-05-08 ENCOUNTER — Ambulatory Visit: Payer: Self-pay | Admitting: Internal Medicine

## 2012-05-09 ENCOUNTER — Inpatient Hospital Stay: Payer: Self-pay | Admitting: Internal Medicine

## 2012-05-09 LAB — CBC WITH DIFFERENTIAL/PLATELET
Basophil #: 0 10*3/uL (ref 0.0–0.1)
Basophil %: 0.3 %
Eosinophil #: 0 10*3/uL (ref 0.0–0.7)
HCT: 26.2 % — ABNORMAL LOW (ref 35.0–47.0)
Lymphocyte %: 6 %
MCH: 30 pg (ref 26.0–34.0)
MCV: 94 fL (ref 80–100)
Monocyte #: 1 x10 3/mm — ABNORMAL HIGH (ref 0.2–0.9)
Monocyte %: 14.6 %
Neutrophil %: 79.1 %
Platelet: 137 10*3/uL — ABNORMAL LOW (ref 150–440)
RBC: 2.81 10*6/uL — ABNORMAL LOW (ref 3.80–5.20)
RDW: 17.2 % — ABNORMAL HIGH (ref 11.5–14.5)
WBC: 6.9 10*3/uL (ref 3.6–11.0)

## 2012-05-09 LAB — BASIC METABOLIC PANEL
BUN: 12 mg/dL (ref 7–18)
Calcium, Total: 8.6 mg/dL (ref 8.5–10.1)
Chloride: 102 mmol/L (ref 98–107)
Co2: 30 mmol/L (ref 21–32)
Creatinine: 0.66 mg/dL (ref 0.60–1.30)
Glucose: 203 mg/dL — ABNORMAL HIGH (ref 65–99)
Osmolality: 287 (ref 275–301)
Potassium: 3.5 mmol/L (ref 3.5–5.1)

## 2012-05-10 LAB — URINE PH
Ph: 8 (ref 4.5–8.0)
Ph: 8 (ref 4.5–8.0)
Ph: 8 (ref 4.5–8.0)
Ph: 7 (ref 4.5–8.0)
Ph: 7 (ref 4.5–8.0)
Ph: 6 (ref 4.5–8.0)
Ph: 7 (ref 4.5–8.0)
Ph: 8 (ref 4.5–8.0)
Ph: 8 (ref 4.5–8.0)

## 2012-05-10 LAB — COMPREHENSIVE METABOLIC PANEL
Anion Gap: 7 (ref 7–16)
BUN: 7 mg/dL (ref 7–18)
Bilirubin,Total: 0.6 mg/dL (ref 0.2–1.0)
Calcium, Total: 8 mg/dL — ABNORMAL LOW (ref 8.5–10.1)
Co2: 30 mmol/L (ref 21–32)
EGFR (Non-African Amer.): >60
Potassium: 4.2 mmol/L (ref 3.5–5.1)
SGPT (ALT): 14 U/L (ref 12–78)
Sodium: 143 mmol/L (ref 136–145)

## 2012-05-10 LAB — BASIC METABOLIC PANEL
Anion Gap: 6 — ABNORMAL LOW (ref 7–16)
Chloride: 101 mmol/L (ref 98–107)
Co2: 33 mmol/L — ABNORMAL HIGH (ref 21–32)
EGFR (African American): >60
EGFR (Non-African Amer.): >60
Glucose: 212 mg/dL — ABNORMAL HIGH (ref 65–99)
Osmolality: 285 (ref 275–301)
Potassium: 3.8 mmol/L (ref 3.5–5.1)
Sodium: 140 mmol/L (ref 136–145)

## 2012-05-10 LAB — CREATININE, SERUM
EGFR (African American): >60
EGFR (Non-African Amer.): >60
EGFR (Non-African Amer.): >60

## 2012-05-10 LAB — POTASSIUM: Potassium: 3.9 mmol/L (ref 3.5–5.1)

## 2012-05-11 LAB — BASIC METABOLIC PANEL
Anion Gap: 4 — ABNORMAL LOW (ref 7–16)
Anion Gap: 6 — ABNORMAL LOW (ref 7–16)
Anion Gap: 6 — ABNORMAL LOW (ref 7–16)
BUN: 11 mg/dL (ref 7–18)
Calcium, Total: 8.2 mg/dL — ABNORMAL LOW (ref 8.5–10.1)
Calcium, Total: 8.1 mg/dL — ABNORMAL LOW (ref 8.5–10.1)
Calcium, Total: 8.2 mg/dL — ABNORMAL LOW (ref 8.5–10.1)
Co2: 36 mmol/L — ABNORMAL HIGH (ref 21–32)
Co2: 36 mmol/L — ABNORMAL HIGH (ref 21–32)
Co2: 35 mmol/L — ABNORMAL HIGH (ref 21–32)
Co2: 39 mmol/L — ABNORMAL HIGH (ref 21–32)
Creatinine: 0.94 mg/dL (ref 0.60–1.30)
Creatinine: 1 mg/dL (ref 0.60–1.30)
Creatinine: 0.9 mg/dL (ref 0.60–1.30)
EGFR (African American): >60
EGFR (African American): >60
EGFR (Non-African Amer.): 52 — ABNORMAL LOW
EGFR (Non-African Amer.): >60
Glucose: 216 mg/dL — ABNORMAL HIGH (ref 65–99)
Glucose: 323 mg/dL — ABNORMAL HIGH (ref 65–99)
Osmolality: 291 (ref 275–301)
Osmolality: 285 (ref 275–301)
Osmolality: 288 (ref 275–301)
Osmolality: 280 (ref 275–301)
Potassium: 3.8 mmol/L (ref 3.5–5.1)
Potassium: 3.8 mmol/L (ref 3.5–5.1)
Potassium: 3.2 mmol/L — ABNORMAL LOW (ref 3.5–5.1)
Sodium: 143 mmol/L (ref 136–145)
Sodium: 139 mmol/L (ref 136–145)
Sodium: 137 mmol/L (ref 136–145)

## 2012-05-11 LAB — URINE PH
Ph: 5 (ref 4.5–8.0)
Ph: 7 (ref 4.5–8.0)
Ph: 7 (ref 4.5–8.0)
Ph: 7 (ref 4.5–8.0)
Ph: 7 (ref 4.5–8.0)
Ph: 7 (ref 4.5–8.0)
Ph: 8 (ref 4.5–8.0)
Ph: 8 (ref 4.5–8.0)
Ph: 8 (ref 4.5–8.0)
Ph: 8 (ref 4.5–8.0)
Ph: 8 (ref 4.5–8.0)
Ph: 8 (ref 4.5–8.0)
Ph: 8 (ref 4.5–8.0)
Ph: 8 (ref 4.5–8.0)
Ph: 9 (ref 4.5–8.0)
Ph: 9 (ref 4.5–8.0)

## 2012-05-11 LAB — COMPREHENSIVE METABOLIC PANEL
Anion Gap: 5 — ABNORMAL LOW (ref 7–16)
BUN: 13 mg/dL (ref 7–18)
Bilirubin,Total: 0.4 mg/dL (ref 0.2–1.0)
Glucose: 188 mg/dL — ABNORMAL HIGH (ref 65–99)
Osmolality: 279 (ref 275–301)
Potassium: 3.3 mmol/L — ABNORMAL LOW (ref 3.5–5.1)
SGOT(AST): 22 U/L (ref 15–37)
Sodium: 137 mmol/L (ref 136–145)

## 2012-05-11 LAB — CBC WITH DIFFERENTIAL/PLATELET
Basophil #: 0 10*3/uL (ref 0.0–0.1)
Eosinophil #: 0 10*3/uL (ref 0.0–0.7)
Eosinophil %: 0 %
HCT: 24.2 % — ABNORMAL LOW (ref 35.0–47.0)
HGB: 7.9 g/dL — ABNORMAL LOW (ref 12.0–16.0)
Monocyte #: 0.5 x10 3/mm (ref 0.2–0.9)
Monocyte %: 8.5 %
Neutrophil #: 4.8 10*3/uL (ref 1.4–6.5)
Neutrophil %: 85.6 %
Platelet: 127 10*3/uL — ABNORMAL LOW (ref 150–440)
RBC: 2.56 10*6/uL — ABNORMAL LOW (ref 3.80–5.20)
WBC: 5.6 10*3/uL (ref 3.6–11.0)

## 2012-05-11 LAB — MAGNESIUM: Magnesium: 1.9 mg/dL

## 2012-05-12 LAB — BASIC METABOLIC PANEL
Anion Gap: 3 — ABNORMAL LOW (ref 7–16)
BUN: 16 mg/dL (ref 7–18)
BUN: 17 mg/dL (ref 7–18)
BUN: 18 mg/dL (ref 7–18)
BUN: 18 mg/dL (ref 7–18)
Calcium, Total: 8.4 mg/dL — ABNORMAL LOW (ref 8.5–10.1)
Calcium, Total: 8.4 mg/dL — ABNORMAL LOW (ref 8.5–10.1)
Chloride: 94 mmol/L — ABNORMAL LOW (ref 98–107)
Chloride: 95 mmol/L — ABNORMAL LOW (ref 98–107)
Chloride: 96 mmol/L — ABNORMAL LOW (ref 98–107)
Chloride: 100 mmol/L (ref 98–107)
Co2: 41 mmol/L (ref 21–32)
Co2: 42 mmol/L (ref 21–32)
Creatinine: 0.92 mg/dL (ref 0.60–1.30)
Creatinine: 1.03 mg/dL (ref 0.60–1.30)
Creatinine: 1.11 mg/dL (ref 0.60–1.30)
Creatinine: 1.04 mg/dL (ref 0.60–1.30)
EGFR (African American): 60 — ABNORMAL LOW
EGFR (Non-African Amer.): 56 — ABNORMAL LOW
EGFR (Non-African Amer.): 51 — ABNORMAL LOW
EGFR (Non-African Amer.): 56 — ABNORMAL LOW
EGFR (Non-African Amer.): 54 — ABNORMAL LOW
Glucose: 187 mg/dL — ABNORMAL HIGH (ref 65–99)
Glucose: 206 mg/dL — ABNORMAL HIGH (ref 65–99)
Glucose: 166 mg/dL — ABNORMAL HIGH (ref 65–99)
Osmolality: 284 (ref 275–301)
Osmolality: 278 (ref 275–301)
Osmolality: 285 (ref 275–301)
Osmolality: 289 (ref 275–301)
Potassium: 3.7 mmol/L (ref 3.5–5.1)
Potassium: 3.6 mmol/L (ref 3.5–5.1)
Potassium: 3.6 mmol/L (ref 3.5–5.1)
Potassium: 4 mmol/L (ref 3.5–5.1)
Sodium: 139 mmol/L (ref 136–145)
Sodium: 140 mmol/L (ref 136–145)
Sodium: 142 mmol/L (ref 136–145)

## 2012-05-12 LAB — URINE PH
Ph: 9 (ref 4.5–8.0)
Ph: 9 (ref 4.5–8.0)
Ph: 9 (ref 4.5–8.0)
Ph: 9 (ref 4.5–8.0)
Ph: 9 (ref 4.5–8.0)
Ph: 9 (ref 4.5–8.0)
Ph: 9 (ref 4.5–8.0)
Ph: 9 (ref 4.5–8.0)
Ph: 9 (ref 4.5–8.0)
Ph: 9 (ref 4.5–8.0)
Ph: 9 (ref 4.5–8.0)
Ph: 9 (ref 4.5–8.0)
Ph: 9 (ref 4.5–8.0)
Ph: 9 (ref 4.5–8.0)
Ph: 9 (ref 4.5–8.0)
Ph: 9 (ref 4.5–8.0)
Ph: 9 (ref 4.5–8.0)
Ph: 9 (ref 4.5–8.0)
Ph: 9 (ref 4.5–8.0)

## 2012-05-12 LAB — CBC WITH DIFFERENTIAL/PLATELET
Basophil #: 0 10*3/uL (ref 0.0–0.1)
Basophil %: 0.1 %
Eosinophil #: 0 10*3/uL (ref 0.0–0.7)
Eosinophil #: 0 10*3/uL (ref 0.0–0.7)
Eosinophil %: 0 %
HCT: 23.2 % — ABNORMAL LOW (ref 35.0–47.0)
HCT: 27.4 % — ABNORMAL LOW (ref 35.0–47.0)
HGB: 7.5 g/dL — ABNORMAL LOW (ref 12.0–16.0)
HGB: 8.9 g/dL — ABNORMAL LOW (ref 12.0–16.0)
Lymphocyte #: 0.3 10*3/uL — ABNORMAL LOW (ref 1.0–3.6)
Lymphocyte #: 0.2 10*3/uL — ABNORMAL LOW (ref 1.0–3.6)
Lymphocyte %: 3.9 %
MCH: 30 pg (ref 26.0–34.0)
MCH: 30.5 pg (ref 26.0–34.0)
MCHC: 32.2 g/dL (ref 32.0–36.0)
MCHC: 32.5 g/dL (ref 32.0–36.0)
MCV: 94 fL (ref 80–100)
Monocyte #: 0.4 x10 3/mm (ref 0.2–0.9)
Monocyte %: 6.7 %
Neutrophil #: 5.4 10*3/uL (ref 1.4–6.5)
Neutrophil %: 85.4 %
Neutrophil %: 89.4 %
Platelet: 140 10*3/uL — ABNORMAL LOW (ref 150–440)
RBC: 2.49 10*6/uL — ABNORMAL LOW (ref 3.80–5.20)
RBC: 2.93 10*6/uL — ABNORMAL LOW (ref 3.80–5.20)
RDW: 17.2 % — ABNORMAL HIGH (ref 11.5–14.5)
WBC: 6 10*3/uL (ref 3.6–11.0)

## 2012-05-12 LAB — MAGNESIUM: Magnesium: 2 mg/dL

## 2012-05-12 LAB — HEMOGLOBIN: HGB: 7.8 g/dL — ABNORMAL LOW (ref 12.0–16.0)

## 2012-05-13 LAB — CBC WITH DIFFERENTIAL/PLATELET
Eosinophil #: 0 10*3/uL (ref 0.0–0.7)
Lymphocyte %: 5.7 %
MCH: 30.7 pg (ref 26.0–34.0)
MCHC: 32.7 g/dL (ref 32.0–36.0)
MCV: 94 fL (ref 80–100)
Monocyte #: 0.2 x10 3/mm (ref 0.2–0.9)
Monocyte %: 3.8 %
Neutrophil #: 5.8 10*3/uL (ref 1.4–6.5)
Neutrophil %: 90.4 %
RBC: 2.63 10*6/uL — ABNORMAL LOW (ref 3.80–5.20)
RDW: 17.3 % — ABNORMAL HIGH (ref 11.5–14.5)
WBC: 6.4 10*3/uL (ref 3.6–11.0)

## 2012-05-13 LAB — BASIC METABOLIC PANEL
Anion Gap: 5 — ABNORMAL LOW (ref 7–16)
Anion Gap: 5 — ABNORMAL LOW (ref 7–16)
BUN: 18 mg/dL (ref 7–18)
BUN: 16 mg/dL (ref 7–18)
Calcium, Total: 8.3 mg/dL — ABNORMAL LOW (ref 8.5–10.1)
Chloride: 103 mmol/L (ref 98–107)
Co2: 36 mmol/L — ABNORMAL HIGH (ref 21–32)
Co2: 34 mmol/L — ABNORMAL HIGH (ref 21–32)
Creatinine: 1.02 mg/dL (ref 0.60–1.30)
EGFR (Non-African Amer.): 57 — ABNORMAL LOW
EGFR (Non-African Amer.): >60
Glucose: 163 mg/dL — ABNORMAL HIGH (ref 65–99)
Glucose: 44 mg/dL — ABNORMAL LOW (ref 65–99)
Osmolality: 283 (ref 275–301)
Potassium: 3.9 mmol/L (ref 3.5–5.1)

## 2012-05-13 LAB — URINE PH
Ph: 8 (ref 4.5–8.0)
Ph: 9
Ph: 9
Ph: 9
Ph: 9
Ph: 9
Ph: 9
Ph: 9 (ref 4.5–8.0)
Ph: 9 (ref 4.5–8.0)
Ph: 9 (ref 4.5–8.0)
Ph: 9 (ref 4.5–8.0)
Ph: 9 (ref 4.5–8.0)
Ph: 9 (ref 4.5–8.0)

## 2012-05-13 LAB — POTASSIUM: Potassium: 4 mmol/L (ref 3.5–5.1)

## 2012-05-13 LAB — CO2, TOTAL: Co2: 38 mmol/L — ABNORMAL HIGH (ref 21–32)

## 2012-05-13 LAB — MAGNESIUM: Magnesium: 1.9 mg/dL

## 2012-05-14 LAB — COMPREHENSIVE METABOLIC PANEL
Alkaline Phosphatase: 70 U/L (ref 50–136)
BUN: 16 mg/dL (ref 7–18)
Bilirubin,Total: 0.3 mg/dL (ref 0.2–1.0)
Calcium, Total: 8.1 mg/dL — ABNORMAL LOW (ref 8.5–10.1)
Chloride: 106 mmol/L (ref 98–107)
Creatinine: 1.05 mg/dL (ref 0.60–1.30)
Glucose: 241 mg/dL — ABNORMAL HIGH (ref 65–99)
Potassium: 4.4 mmol/L (ref 3.5–5.1)
SGOT(AST): 14 U/L — ABNORMAL LOW (ref 15–37)
SGPT (ALT): 21 U/L (ref 12–78)
Sodium: 142 mmol/L (ref 136–145)
Total Protein: 4.7 g/dL — ABNORMAL LOW (ref 6.4–8.2)

## 2012-05-14 LAB — URINE PH
Ph: 7 (ref 4.5–8.0)
Ph: 7 (ref 4.5–8.0)
Ph: 7 (ref 4.5–8.0)
Ph: 8 (ref 4.5–8.0)
Ph: 8 (ref 4.5–8.0)
Ph: 8 (ref 4.5–8.0)
Ph: 8 (ref 4.5–8.0)
Ph: 8 (ref 4.5–8.0)
Ph: 8 (ref 4.5–8.0)
Ph: 8 (ref 4.5–8.0)
Ph: 9 (ref 4.5–8.0)
Ph: 9 (ref 4.5–8.0)
Ph: 9 (ref 4.5–8.0)
Ph: 9 (ref 4.5–8.0)
Ph: 9 (ref 4.5–8.0)
Ph: 9 (ref 4.5–8.0)
Ph: 9 (ref 4.5–8.0)
Ph: 9 (ref 4.5–8.0)
Ph: 9 (ref 4.5–8.0)
Ph: 9 (ref 4.5–8.0)
Ph: 9 (ref 4.5–8.0)

## 2012-05-14 LAB — CBC WITH DIFFERENTIAL/PLATELET
Basophil %: 0.2 %
Eosinophil #: 0 10*3/uL (ref 0.0–0.7)
HCT: 25.7 % — ABNORMAL LOW (ref 35.0–47.0)
Lymphocyte #: 0.3 10*3/uL — ABNORMAL LOW (ref 1.0–3.6)
Monocyte #: 0.2 x10 3/mm (ref 0.2–0.9)
Neutrophil %: 90 %
Platelet: 135 10*3/uL — ABNORMAL LOW (ref 150–440)
RDW: 17.4 % — ABNORMAL HIGH (ref 11.5–14.5)
WBC: 4.8 10*3/uL (ref 3.6–11.0)

## 2012-05-14 LAB — BASIC METABOLIC PANEL
Anion Gap: 5 — ABNORMAL LOW (ref 7–16)
BUN: 16 mg/dL (ref 7–18)
Calcium, Total: 8.1 mg/dL — ABNORMAL LOW (ref 8.5–10.1)
Co2: 32 mmol/L (ref 21–32)
EGFR (Non-African Amer.): >60
Glucose: 176 mg/dL — ABNORMAL HIGH (ref 65–99)
Osmolality: 292 (ref 275–301)

## 2012-05-14 LAB — PRO B NATRIURETIC PEPTIDE: B-Type Natriuretic Peptide: 1722 pg/mL — ABNORMAL HIGH (ref 0–125)

## 2012-05-15 LAB — URINE PH
Ph: 7 (ref 4.5–8.0)
Ph: 8 (ref 4.5–8.0)
Ph: 8 (ref 4.5–8.0)
Ph: 8 (ref 4.5–8.0)
Ph: 8 (ref 4.5–8.0)
Ph: 8 (ref 4.5–8.0)
Ph: 8 (ref 4.5–8.0)
Ph: 8 (ref 4.5–8.0)
Ph: 8 (ref 4.5–8.0)
Ph: 8 (ref 4.5–8.0)
Ph: 8 (ref 4.5–8.0)
Ph: 8 (ref 4.5–8.0)
Ph: 9 (ref 4.5–8.0)
Ph: 9 (ref 4.5–8.0)
Ph: 9 (ref 4.5–8.0)
Ph: 9 (ref 4.5–8.0)

## 2012-05-15 LAB — COMPREHENSIVE METABOLIC PANEL
Albumin: 2 g/dL — ABNORMAL LOW (ref 3.4–5.0)
Anion Gap: 2 — ABNORMAL LOW (ref 7–16)
BUN: 13 mg/dL (ref 7–18)
Bilirubin,Total: 0.3 mg/dL (ref 0.2–1.0)
Calcium, Total: 8.1 mg/dL — ABNORMAL LOW (ref 8.5–10.1)
Chloride: 105 mmol/L (ref 98–107)
Co2: 36 mmol/L — ABNORMAL HIGH (ref 21–32)
EGFR (African American): >60
EGFR (Non-African Amer.): >60
Glucose: 128 mg/dL — ABNORMAL HIGH (ref 65–99)
Potassium: 4 mmol/L (ref 3.5–5.1)
SGOT(AST): 11 U/L — ABNORMAL LOW (ref 15–37)
SGPT (ALT): 17 U/L (ref 12–78)
Sodium: 143 mmol/L (ref 136–145)
Total Protein: 4.6 g/dL — ABNORMAL LOW (ref 6.4–8.2)

## 2012-05-15 LAB — CBC WITH DIFFERENTIAL/PLATELET
Basophil #: 0 10*3/uL (ref 0.0–0.1)
Basophil #: 0 10*3/uL (ref 0.0–0.1)
Basophil %: 0.5 %
Eosinophil #: 0 10*3/uL (ref 0.0–0.7)
Eosinophil #: 0 10*3/uL (ref 0.0–0.7)
Eosinophil %: 0 %
Eosinophil %: 0 %
HCT: 22.6 % — ABNORMAL LOW (ref 35.0–47.0)
HCT: 23.6 % — ABNORMAL LOW (ref 35.0–47.0)
HGB: 7.5 g/dL — ABNORMAL LOW (ref 12.0–16.0)
HGB: 7.4 g/dL — ABNORMAL LOW (ref 12.0–16.0)
Lymphocyte #: 0.3 10*3/uL — ABNORMAL LOW (ref 1.0–3.6)
Lymphocyte #: 0.3 10*3/uL — ABNORMAL LOW (ref 1.0–3.6)
Lymphocyte %: 8.4 %
MCH: 30.7 pg (ref 26.0–34.0)
MCH: 29.4 pg (ref 26.0–34.0)
MCHC: 33.1 g/dL (ref 32.0–36.0)
MCHC: 31.5 g/dL — ABNORMAL LOW (ref 32.0–36.0)
Monocyte #: 0.3 x10 3/mm (ref 0.2–0.9)
Monocyte #: 0.4 x10 3/mm (ref 0.2–0.9)
Monocyte %: 9 %
Monocyte %: 9.7 %
Neutrophil #: 2.5 10*3/uL (ref 1.4–6.5)
Neutrophil %: 81 %
Platelet: 85 10*3/uL — ABNORMAL LOW (ref 150–440)
RBC: 2.44 10*6/uL — ABNORMAL LOW (ref 3.80–5.20)
RBC: 2.53 10*6/uL — ABNORMAL LOW (ref 3.80–5.20)
RDW: 16.5 % — ABNORMAL HIGH (ref 11.5–14.5)
RDW: 17 % — ABNORMAL HIGH (ref 11.5–14.5)
WBC: 3.1 10*3/uL — ABNORMAL LOW (ref 3.6–11.0)
WBC: 4.1 10*3/uL (ref 3.6–11.0)

## 2012-05-15 LAB — BASIC METABOLIC PANEL
Anion Gap: 3 — ABNORMAL LOW (ref 7–16)
BUN: 13 mg/dL (ref 7–18)
Chloride: 102 mmol/L (ref 98–107)
Creatinine: 0.99 mg/dL (ref 0.60–1.30)
EGFR (African American): >60
EGFR (Non-African Amer.): 59 — ABNORMAL LOW
Glucose: 240 mg/dL — ABNORMAL HIGH (ref 65–99)
Osmolality: 289 (ref 275–301)
Potassium: 4.1 mmol/L (ref 3.5–5.1)

## 2012-05-15 LAB — MAGNESIUM: Magnesium: 1.8 mg/dL

## 2012-05-16 LAB — URINE PH
Ph: 8 (ref 4.5–8.0)
Ph: 8 (ref 4.5–8.0)
Ph: 8 (ref 4.5–8.0)
Ph: 8 (ref 4.5–8.0)
Ph: 8 (ref 4.5–8.0)
Ph: 8 (ref 4.5–8.0)
Ph: 8 (ref 4.5–8.0)
Ph: 8 (ref 4.5–8.0)
Ph: 9 (ref 4.5–8.0)
Ph: 9 (ref 4.5–8.0)
Ph: 9 (ref 4.5–8.0)

## 2012-05-16 LAB — CBC WITH DIFFERENTIAL/PLATELET
Basophil #: 0 10*3/uL (ref 0.0–0.1)
Basophil %: 0.3 %
Basophil %: 0.2 %
Eosinophil #: 0 10*3/uL (ref 0.0–0.7)
Eosinophil %: 0.1 %
Eosinophil %: 0.2 %
HGB: 7.2 g/dL — ABNORMAL LOW (ref 12.0–16.0)
MCH: 29.9 pg (ref 26.0–34.0)
MCHC: 32.3 g/dL (ref 32.0–36.0)
MCHC: 33.7 g/dL (ref 32.0–36.0)
Monocyte #: 0.4 x10 3/mm (ref 0.2–0.9)
Monocyte #: 0.6 x10 3/mm (ref 0.2–0.9)
Monocyte %: 15.5 %
Neutrophil #: 3.2 10*3/uL (ref 1.4–6.5)
Neutrophil %: 75.4 %
Platelet: 68 10*3/uL — ABNORMAL LOW (ref 150–440)
RBC: 2.39 10*6/uL — ABNORMAL LOW (ref 3.80–5.20)
RBC: 2.65 10*6/uL — ABNORMAL LOW (ref 3.80–5.20)
RDW: 16.4 % — ABNORMAL HIGH (ref 11.5–14.5)
WBC: 2.9 10*3/uL — ABNORMAL LOW (ref 3.6–11.0)

## 2012-05-16 LAB — BASIC METABOLIC PANEL
Anion Gap: 6 — ABNORMAL LOW (ref 7–16)
Chloride: 98 mmol/L (ref 98–107)
EGFR (African American): >60
EGFR (Non-African Amer.): 60 — ABNORMAL LOW
Potassium: 4 mmol/L (ref 3.5–5.1)
Sodium: 137 mmol/L (ref 136–145)

## 2012-05-16 LAB — COMPREHENSIVE METABOLIC PANEL
Albumin: 1.9 g/dL — ABNORMAL LOW (ref 3.4–5.0)
Anion Gap: 4 — ABNORMAL LOW (ref 7–16)
BUN: 12 mg/dL (ref 7–18)
Bilirubin,Total: 0.3 mg/dL (ref 0.2–1.0)
Calcium, Total: 8.4 mg/dL — ABNORMAL LOW (ref 8.5–10.1)
Chloride: 103 mmol/L (ref 98–107)
Creatinine: 0.93 mg/dL (ref 0.60–1.30)
EGFR (Non-African Amer.): >60
Glucose: 158 mg/dL — ABNORMAL HIGH (ref 65–99)
Osmolality: 286 (ref 275–301)
Sodium: 142 mmol/L (ref 136–145)
Total Protein: 4.5 g/dL — ABNORMAL LOW (ref 6.4–8.2)

## 2012-05-17 LAB — BASIC METABOLIC PANEL
BUN: 11 mg/dL (ref 7–18)
Calcium, Total: 8.8 mg/dL (ref 8.5–10.1)
Creatinine: 0.99 mg/dL (ref 0.60–1.30)
EGFR (Non-African Amer.): 59 — ABNORMAL LOW
Glucose: 150 mg/dL — ABNORMAL HIGH (ref 65–99)
Potassium: 3.7 mmol/L (ref 3.5–5.1)

## 2012-05-17 LAB — COMPREHENSIVE METABOLIC PANEL
Alkaline Phosphatase: 82 U/L (ref 50–136)
BUN: 11 mg/dL (ref 7–18)
Bilirubin,Total: 0.6 mg/dL (ref 0.2–1.0)
Calcium, Total: 8.9 mg/dL (ref 8.5–10.1)
Chloride: 103 mmol/L (ref 98–107)
Co2: 36 mmol/L — ABNORMAL HIGH (ref 21–32)
Creatinine: 0.98 mg/dL (ref 0.60–1.30)
EGFR (African American): >60
EGFR (Non-African Amer.): 60 — ABNORMAL LOW
Glucose: 89 mg/dL (ref 65–99)
Osmolality: 284 (ref 275–301)
Potassium: 4.1 mmol/L (ref 3.5–5.1)
SGPT (ALT): 15 U/L (ref 12–78)

## 2012-05-17 LAB — URINE PH
Ph: 8 (ref 4.5–8.0)
Ph: 8 (ref 4.5–8.0)
Ph: 8 (ref 4.5–8.0)
Ph: 8 (ref 4.5–8.0)
Ph: 8 (ref 4.5–8.0)
Ph: 8 (ref 4.5–8.0)
Ph: 8 (ref 4.5–8.0)
Ph: 8 (ref 4.5–8.0)
Ph: 8 (ref 4.5–8.0)
Ph: 8 (ref 4.5–8.0)
Ph: 9 (ref 4.5–8.0)
Ph: 9 (ref 4.5–8.0)
Ph: 9 (ref 4.5–8.0)
Ph: 9 (ref 4.5–8.0)
Ph: 9 (ref 4.5–8.0)
Ph: 9 (ref 4.5–8.0)

## 2012-05-17 LAB — CBC WITH DIFFERENTIAL/PLATELET
Basophil #: 0 10*3/uL (ref 0.0–0.1)
Basophil %: 0.2 %
Eosinophil %: 0.1 %
HCT: 25.4 % — ABNORMAL LOW (ref 35.0–47.0)
HGB: 8.5 g/dL — ABNORMAL LOW (ref 12.0–16.0)
Lymphocyte #: 0.4 10*3/uL — ABNORMAL LOW (ref 1.0–3.6)
MCH: 30.2 pg (ref 26.0–34.0)
Neutrophil #: 3.4 10*3/uL (ref 1.4–6.5)
Neutrophil %: 75 %
Platelet: 48 10*3/uL — ABNORMAL LOW (ref 150–440)
RDW: 17 % — ABNORMAL HIGH (ref 11.5–14.5)

## 2012-05-17 LAB — MAGNESIUM: Magnesium: 1.7 mg/dL — ABNORMAL LOW

## 2012-05-18 LAB — URINE PH
Ph: 8 (ref 4.5–8.0)
Ph: 8 (ref 4.5–8.0)
Ph: 8 (ref 4.5–8.0)
Ph: 8 (ref 4.5–8.0)
Ph: 8 (ref 4.5–8.0)
Ph: 8 (ref 4.5–8.0)
Ph: 8 (ref 4.5–8.0)
Ph: 9 (ref 4.5–8.0)
Ph: 9 (ref 4.5–8.0)
Ph: 9 (ref 4.5–8.0)
Ph: 9 (ref 4.5–8.0)
Ph: 9 (ref 4.5–8.0)
Ph: 9 (ref 4.5–8.0)
Ph: 9 (ref 4.5–8.0)
Ph: 9 (ref 4.5–8.0)
Ph: 9 (ref 4.5–8.0)
Ph: 9 (ref 4.5–8.0)
Ph: 9 (ref 4.5–8.0)
Ph: 9 (ref 4.5–8.0)
Ph: 9 (ref 4.5–8.0)
Ph: 9 (ref 4.5–8.0)

## 2012-05-18 LAB — CBC WITH DIFFERENTIAL/PLATELET
Basophil %: 0.2 %
Eosinophil #: 0 10*3/uL (ref 0.0–0.7)
Eosinophil %: 0.2 %
HCT: 24.6 % — ABNORMAL LOW (ref 35.0–47.0)
HGB: 8.2 g/dL — ABNORMAL LOW (ref 12.0–16.0)
Lymphocyte %: 6.8 %
MCHC: 33.3 g/dL (ref 32.0–36.0)
Monocyte #: 0.9 x10 3/mm (ref 0.2–0.9)
Monocyte %: 17.2 %
Neutrophil #: 3.9 10*3/uL (ref 1.4–6.5)
Platelet: 36 10*3/uL — ABNORMAL LOW (ref 150–440)
RDW: 16.7 % — ABNORMAL HIGH (ref 11.5–14.5)
WBC: 5.1 10*3/uL (ref 3.6–11.0)

## 2012-05-18 LAB — COMPREHENSIVE METABOLIC PANEL
Albumin: 2 g/dL — ABNORMAL LOW (ref 3.4–5.0)
Anion Gap: 4 — ABNORMAL LOW (ref 7–16)
BUN: 10 mg/dL (ref 7–18)
Bilirubin,Total: 0.4 mg/dL (ref 0.2–1.0)
Calcium, Total: 8.7 mg/dL (ref 8.5–10.1)
Co2: 35 mmol/L — ABNORMAL HIGH (ref 21–32)
Creatinine: 1 mg/dL (ref 0.60–1.30)
EGFR (African American): >60
Glucose: 100 mg/dL — ABNORMAL HIGH (ref 65–99)
Potassium: 3.7 mmol/L (ref 3.5–5.1)
SGOT(AST): 20 U/L (ref 15–37)
SGPT (ALT): 18 U/L (ref 12–78)
Total Protein: 4.6 g/dL — ABNORMAL LOW (ref 6.4–8.2)

## 2012-05-18 LAB — CREATININE, SERUM
Creatinine: 1.03 mg/dL (ref 0.60–1.30)
EGFR (Non-African Amer.): 56 — ABNORMAL LOW

## 2012-05-18 LAB — POTASSIUM: Potassium: 3.6 mmol/L (ref 3.5–5.1)

## 2012-05-18 LAB — PLATELET COUNT: Platelet: 37 10*3/uL — ABNORMAL LOW (ref 150–440)

## 2012-05-18 LAB — MAGNESIUM: Magnesium: 1.5 mg/dL — ABNORMAL LOW

## 2012-05-19 LAB — CBC WITH DIFFERENTIAL/PLATELET
Basophil #: 0 10*3/uL (ref 0.0–0.1)
Basophil %: 0.6 %
Basophil %: 0.6 %
Eosinophil #: 0 10*3/uL (ref 0.0–0.7)
Eosinophil %: 0.1 %
Eosinophil %: 0.1 %
HCT: 23.5 % — ABNORMAL LOW (ref 35.0–47.0)
HCT: 25.1 % — ABNORMAL LOW (ref 35.0–47.0)
HGB: 7.8 g/dL — ABNORMAL LOW (ref 12.0–16.0)
Lymphocyte #: 0.5 10*3/uL — ABNORMAL LOW (ref 1.0–3.6)
Lymphocyte %: 10.4 %
Lymphocyte %: 11.2 %
MCH: 30.4 pg (ref 26.0–34.0)
MCHC: 33.1 g/dL (ref 32.0–36.0)
MCHC: 33.1 g/dL (ref 32.0–36.0)
MCV: 92 fL (ref 80–100)
MCV: 91 fL (ref 80–100)
Monocyte #: 1.2 x10 3/mm — ABNORMAL HIGH (ref 0.2–0.9)
Monocyte #: 1.5 x10 3/mm — ABNORMAL HIGH (ref 0.2–0.9)
Monocyte %: 26.2 %
Monocyte %: 28.5 %
Neutrophil #: 2.8 10*3/uL (ref 1.4–6.5)
Neutrophil #: 3.1 10*3/uL (ref 1.4–6.5)
Neutrophil %: 62.7 %
Neutrophil %: 59.6 %
Platelet: 33 10*3/uL — ABNORMAL LOW (ref 150–440)
RBC: 2.56 10*6/uL — ABNORMAL LOW (ref 3.80–5.20)
RBC: 2.75 10*6/uL — ABNORMAL LOW (ref 3.80–5.20)
RDW: 16.6 % — ABNORMAL HIGH (ref 11.5–14.5)
WBC: 4.4 10*3/uL (ref 3.6–11.0)

## 2012-05-19 LAB — MAGNESIUM
Magnesium: 1.7 mg/dL — ABNORMAL LOW
Magnesium: 1.6 mg/dL — ABNORMAL LOW

## 2012-05-19 LAB — URINE PH
Ph: 7 (ref 4.5–8.0)
Ph: 7 (ref 4.5–8.0)
Ph: 8 (ref 4.5–8.0)
Ph: 8 (ref 4.5–8.0)
Ph: 8 (ref 4.5–8.0)
Ph: 8 (ref 4.5–8.0)
Ph: 8 (ref 4.5–8.0)
Ph: 8 (ref 4.5–8.0)
Ph: 8 (ref 4.5–8.0)
Ph: 8 (ref 4.5–8.0)
Ph: 8 (ref 4.5–8.0)
Ph: 8 (ref 4.5–8.0)
Ph: 9 (ref 4.5–8.0)
Ph: 9 (ref 4.5–8.0)
Ph: 9 (ref 4.5–8.0)
Ph: 9 (ref 4.5–8.0)
Ph: 9 (ref 4.5–8.0)
Ph: 9 (ref 4.5–8.0)
Ph: 9 (ref 4.5–8.0)
Ph: 9 (ref 4.5–8.0)
Ph: 9 (ref 4.5–8.0)
Ph: 9 (ref 4.5–8.0)

## 2012-05-19 LAB — BASIC METABOLIC PANEL
Anion Gap: 5 — ABNORMAL LOW (ref 7–16)
BUN: 10 mg/dL (ref 7–18)
Calcium, Total: 8.9 mg/dL (ref 8.5–10.1)
Chloride: 103 mmol/L (ref 98–107)
Co2: 32 mmol/L (ref 21–32)
Creatinine: 1.01 mg/dL (ref 0.60–1.30)
EGFR (African American): >60
EGFR (Non-African Amer.): 58 — ABNORMAL LOW
Glucose: 145 mg/dL — ABNORMAL HIGH (ref 65–99)
Osmolality: 281 (ref 275–301)
Potassium: 3.7 mmol/L (ref 3.5–5.1)
Sodium: 140 mmol/L (ref 136–145)

## 2012-05-19 LAB — COMPREHENSIVE METABOLIC PANEL
Albumin: 2.3 g/dL — ABNORMAL LOW (ref 3.4–5.0)
Alkaline Phosphatase: 84 U/L (ref 50–136)
Anion Gap: 5 — ABNORMAL LOW (ref 7–16)
BUN: 10 mg/dL (ref 7–18)
Bilirubin,Total: 0.4 mg/dL (ref 0.2–1.0)
Creatinine: 0.99 mg/dL (ref 0.60–1.30)
EGFR (African American): >60
EGFR (Non-African Amer.): 59 — ABNORMAL LOW
Glucose: 96 mg/dL (ref 65–99)
Osmolality: 278 (ref 275–301)
Potassium: 3.6 mmol/L (ref 3.5–5.1)
SGOT(AST): 27 U/L (ref 15–37)
SGPT (ALT): 24 U/L (ref 12–78)
Sodium: 140 mmol/L (ref 136–145)

## 2012-05-19 LAB — PROTIME-INR: INR: 1

## 2012-05-19 LAB — APTT: Activated PTT: 34.9 secs (ref 23.6–35.9)

## 2012-05-20 LAB — CBC WITH DIFFERENTIAL/PLATELET
Basophil #: 0 10*3/uL (ref 0.0–0.1)
Eosinophil #: 0 10*3/uL (ref 0.0–0.7)
HCT: 23.5 % — ABNORMAL LOW (ref 35.0–47.0)
HGB: 7.8 g/dL — ABNORMAL LOW (ref 12.0–16.0)
Lymphocyte %: 10.3 %
MCH: 30.5 pg (ref 26.0–34.0)
MCV: 92 fL (ref 80–100)
Monocyte %: 32.1 %
Neutrophil #: 2.5 10*3/uL (ref 1.4–6.5)
RBC: 2.55 10*6/uL — ABNORMAL LOW (ref 3.80–5.20)
RDW: 16.2 % — ABNORMAL HIGH (ref 11.5–14.5)
WBC: 4.4 10*3/uL (ref 3.6–11.0)

## 2012-05-20 LAB — URINE PH
Ph: 9 (ref 4.5–8.0)
Ph: 8 (ref 4.5–8.0)
Ph: 7 (ref 4.5–8.0)
Ph: 7 (ref 4.5–8.0)
Ph: 7 (ref 4.5–8.0)
Ph: 8 (ref 4.5–8.0)
Ph: 8 (ref 4.5–8.0)
Ph: 8 (ref 4.5–8.0)
Ph: 6 (ref 4.5–8.0)
Ph: 8 (ref 4.5–8.0)
Ph: 8 (ref 4.5–8.0)
Ph: 8 (ref 4.5–8.0)
Ph: 8 (ref 4.5–8.0)
Ph: 8 (ref 4.5–8.0)
Ph: 8 (ref 4.5–8.0)

## 2012-05-20 LAB — BASIC METABOLIC PANEL
Anion Gap: 5 — ABNORMAL LOW (ref 7–16)
EGFR (Non-African Amer.): >60
Osmolality: 281 (ref 275–301)
Potassium: 3.6 mmol/L (ref 3.5–5.1)
Sodium: 142 mmol/L (ref 136–145)

## 2012-05-20 LAB — MAGNESIUM: Magnesium: 1.8 mg/dL

## 2012-05-21 LAB — COMPREHENSIVE METABOLIC PANEL
Albumin: 2.1 g/dL — ABNORMAL LOW (ref 3.4–5.0)
Alkaline Phosphatase: 74 U/L (ref 50–136)
Anion Gap: 6 — ABNORMAL LOW (ref 7–16)
BUN: 9 mg/dL (ref 7–18)
Bilirubin,Total: 0.4 mg/dL (ref 0.2–1.0)
Calcium, Total: 8.6 mg/dL (ref 8.5–10.1)
Glucose: 76 mg/dL (ref 65–99)
Osmolality: 282 (ref 275–301)
Total Protein: 4.8 g/dL — ABNORMAL LOW (ref 6.4–8.2)

## 2012-05-21 LAB — CBC WITH DIFFERENTIAL/PLATELET
Eosinophil %: 0.1 %
HCT: 24 % — ABNORMAL LOW (ref 35.0–47.0)
HGB: 7.5 g/dL — ABNORMAL LOW (ref 12.0–16.0)
Lymphocyte #: 0.5 10*3/uL — ABNORMAL LOW (ref 1.0–3.6)
Lymphocyte %: 12 %
MCH: 28.6 pg (ref 26.0–34.0)
MCV: 92 fL (ref 80–100)
Neutrophil #: 2.4 10*3/uL (ref 1.4–6.5)
Neutrophil %: 54.5 %
Platelet: 46 10*3/uL — ABNORMAL LOW (ref 150–440)
RBC: 2.62 10*6/uL — ABNORMAL LOW (ref 3.80–5.20)
RDW: 16.7 % — ABNORMAL HIGH (ref 11.5–14.5)

## 2012-05-21 LAB — URINE PH
Ph: 7 (ref 4.5–8.0)
Ph: 7 (ref 4.5–8.0)
Ph: 7 (ref 4.5–8.0)
Ph: 8 (ref 4.5–8.0)
Ph: 8 (ref 4.5–8.0)
Ph: 8 (ref 4.5–8.0)
Ph: 8 (ref 4.5–8.0)
Ph: 8 (ref 4.5–8.0)
Ph: 8 (ref 4.5–8.0)
Ph: 8 (ref 4.5–8.0)
Ph: 9 (ref 4.5–8.0)
Ph: 9 (ref 4.5–8.0)

## 2012-05-21 LAB — MAGNESIUM: Magnesium: 1.9 mg/dL

## 2012-05-22 LAB — URINE PH
Ph: 8 (ref 4.5–8.0)
Ph: 9 (ref 4.5–8.0)
Ph: 9 (ref 4.5–8.0)
Ph: 9 (ref 4.5–8.0)
Ph: 9 (ref 4.5–8.0)

## 2012-05-23 LAB — CBC WITH DIFFERENTIAL/PLATELET
Eosinophil %: 0.4 %
Lymphocyte #: 0.5 10*3/uL — ABNORMAL LOW (ref 1.0–3.6)
Lymphocyte %: 10.7 %
MCH: 30.6 pg (ref 26.0–34.0)
MCHC: 33.1 g/dL (ref 32.0–36.0)
MCV: 93 fL (ref 80–100)
Neutrophil %: 60 %
Platelet: 111 10*3/uL — ABNORMAL LOW (ref 150–440)
RDW: 17.2 % — ABNORMAL HIGH (ref 11.5–14.5)

## 2012-05-23 LAB — BASIC METABOLIC PANEL
Anion Gap: 8 (ref 7–16)
BUN: 7 mg/dL (ref 7–18)
Chloride: 105 mmol/L (ref 98–107)
Creatinine: 0.73 mg/dL (ref 0.60–1.30)
EGFR (Non-African Amer.): >60
Glucose: 105 mg/dL — ABNORMAL HIGH (ref 65–99)
Osmolality: 283 (ref 275–301)
Sodium: 143 mmol/L (ref 136–145)

## 2012-05-23 LAB — URINE PH
Ph: 8 (ref 4.5–8.0)
Ph: 9 (ref 4.5–8.0)
Ph: 8 (ref 4.5–8.0)
Ph: 8 (ref 4.5–8.0)
Ph: 8 (ref 4.5–8.0)

## 2012-05-24 LAB — COMPREHENSIVE METABOLIC PANEL WITH GFR
Albumin: 2.4 g/dL — ABNORMAL LOW
Alkaline Phosphatase: 78 U/L
Anion Gap: 4 — ABNORMAL LOW
BUN: 7 mg/dL
Bilirubin,Total: 0.5 mg/dL
Calcium, Total: 8.4 mg/dL — ABNORMAL LOW
Chloride: 104 mmol/L
Co2: 33 mmol/L — ABNORMAL HIGH
Creatinine: 0.85 mg/dL
EGFR (African American): >60
EGFR (Non-African Amer.): >60
Glucose: 186 mg/dL — ABNORMAL HIGH
Osmolality: 284
Potassium: 3.8 mmol/L
SGOT(AST): 15 U/L
SGPT (ALT): 16 U/L
Sodium: 141 mmol/L
Total Protein: 5.3 g/dL — ABNORMAL LOW

## 2012-05-24 LAB — CBC WITH DIFFERENTIAL/PLATELET
Basophil %: 0.8 %
Eosinophil %: 0.3 %
HCT: 25.9 % — ABNORMAL LOW (ref 35.0–47.0)
Lymphocyte #: 0.4 10*3/uL — ABNORMAL LOW (ref 1.0–3.6)
Lymphocyte %: 7.6 %
MCH: 30.4 pg (ref 26.0–34.0)
MCHC: 32.6 g/dL (ref 32.0–36.0)
MCV: 93 fL (ref 80–100)
Neutrophil #: 3.4 10*3/uL (ref 1.4–6.5)
Platelet: 149 10*3/uL — ABNORMAL LOW (ref 150–440)
RBC: 2.77 10*6/uL — ABNORMAL LOW (ref 3.80–5.20)
RDW: 17.6 % — ABNORMAL HIGH (ref 11.5–14.5)
WBC: 5 10*3/uL (ref 3.6–11.0)

## 2012-05-24 LAB — URINE PH
Ph: 8 (ref 4.5–8.0)
Ph: 8 (ref 4.5–8.0)
Ph: 7 (ref 4.5–8.0)

## 2012-05-25 LAB — URINE PH
Ph: 7 (ref 4.5–8.0)
Ph: 7 (ref 4.5–8.0)
Ph: 8 (ref 4.5–8.0)
Ph: 9 (ref 4.5–8.0)
Ph: 9 (ref 4.5–8.0)
Ph: 9 (ref 4.5–8.0)
Ph: 9 (ref 4.5–8.0)

## 2012-05-25 LAB — CBC WITH DIFFERENTIAL/PLATELET
Basophil %: 1.1 %
Eosinophil #: 0 10*3/uL (ref 0.0–0.7)
Eosinophil %: 0.6 %
HCT: 24.7 % — ABNORMAL LOW (ref 35.0–47.0)
Lymphocyte %: 12.2 %
MCH: 28.7 pg (ref 26.0–34.0)
MCHC: 30.7 g/dL — ABNORMAL LOW (ref 32.0–36.0)
MCV: 94 fL (ref 80–100)
Monocyte #: 1.3 x10 3/mm — ABNORMAL HIGH (ref 0.2–0.9)
Neutrophil %: 57.7 %
Platelet: 162 10*3/uL (ref 150–440)
RDW: 17.5 % — ABNORMAL HIGH (ref 11.5–14.5)

## 2012-05-25 LAB — BASIC METABOLIC PANEL
Anion Gap: 2 — ABNORMAL LOW (ref 7–16)
Chloride: 103 mmol/L (ref 98–107)
Co2: 36 mmol/L — ABNORMAL HIGH (ref 21–32)
Creatinine: 0.87 mg/dL (ref 0.60–1.30)
EGFR (Non-African Amer.): >60
Glucose: 90 mg/dL (ref 65–99)
Osmolality: 279 (ref 275–301)

## 2012-05-26 LAB — BASIC METABOLIC PANEL
BUN: 8 mg/dL (ref 7–18)
Chloride: 104 mmol/L (ref 98–107)
Co2: 34 mmol/L — ABNORMAL HIGH (ref 21–32)
Creatinine: 0.81 mg/dL (ref 0.60–1.30)
EGFR (African American): >60
Osmolality: 282 (ref 275–301)
Potassium: 3.8 mmol/L (ref 3.5–5.1)

## 2012-05-26 LAB — CBC WITH DIFFERENTIAL/PLATELET
Basophil %: 0.9 %
Eosinophil #: 0 10*3/uL (ref 0.0–0.7)
Eosinophil %: 0.6 %
HGB: 7.5 g/dL — ABNORMAL LOW (ref 12.0–16.0)
Lymphocyte #: 0.7 10*3/uL — ABNORMAL LOW (ref 1.0–3.6)
MCHC: 31.3 g/dL — ABNORMAL LOW (ref 32.0–36.0)
MCV: 94 fL (ref 80–100)
Monocyte #: 1.2 x10 3/mm — ABNORMAL HIGH (ref 0.2–0.9)
Monocyte %: 25.3 %
Neutrophil #: 2.6 10*3/uL (ref 1.4–6.5)
Neutrophil %: 57.4 %
RBC: 2.55 10*6/uL — ABNORMAL LOW (ref 3.80–5.20)
RDW: 18.1 % — ABNORMAL HIGH (ref 11.5–14.5)
WBC: 4.6 10*3/uL (ref 3.6–11.0)

## 2012-05-26 LAB — URINE PH
Ph: 8 (ref 4.5–8.0)
Ph: 7 (ref 4.5–8.0)
Ph: 8 (ref 4.5–8.0)
Ph: 8 (ref 4.5–8.0)
Ph: 7 (ref 4.5–8.0)
Ph: 9 (ref 4.5–8.0)
Ph: 8 (ref 4.5–8.0)

## 2012-05-26 LAB — MAGNESIUM: Magnesium: 2 mg/dL

## 2012-05-27 LAB — URINE PH
Ph: 7 (ref 4.5–8.0)
Ph: 8 (ref 4.5–8.0)
Ph: 8 (ref 4.5–8.0)
Ph: 8 (ref 4.5–8.0)

## 2012-05-27 LAB — BASIC METABOLIC PANEL
Chloride: 102 mmol/L (ref 98–107)
EGFR (African American): >60
Sodium: 141 mmol/L (ref 136–145)

## 2012-05-28 LAB — CBC WITH DIFFERENTIAL/PLATELET
Basophil #: 0 10*3/uL (ref 0.0–0.1)
Eosinophil #: 0 10*3/uL (ref 0.0–0.7)
Eosinophil %: 0.9 %
HGB: 8.2 g/dL — ABNORMAL LOW (ref 12.0–16.0)
Lymphocyte #: 0.7 10*3/uL — ABNORMAL LOW (ref 1.0–3.6)
Lymphocyte %: 14.5 %
MCH: 30.9 pg (ref 26.0–34.0)
MCV: 95 fL (ref 80–100)
Monocyte %: 22.4 %
Platelet: 220 10*3/uL (ref 150–440)
RBC: 2.66 10*6/uL — ABNORMAL LOW (ref 3.80–5.20)
RDW: 18.7 % — ABNORMAL HIGH (ref 11.5–14.5)
WBC: 4.6 10*3/uL (ref 3.6–11.0)

## 2012-05-28 LAB — URINE PH
Ph: 8 (ref 4.5–8.0)
Ph: 9 (ref 4.5–8.0)

## 2012-05-28 LAB — COMPREHENSIVE METABOLIC PANEL
Anion Gap: 6 — ABNORMAL LOW (ref 7–16)
Bilirubin,Total: 0.4 mg/dL (ref 0.2–1.0)
Calcium, Total: 8.5 mg/dL (ref 8.5–10.1)
Chloride: 103 mmol/L (ref 98–107)
Creatinine: 0.69 mg/dL (ref 0.60–1.30)
EGFR (Non-African Amer.): >60
Glucose: 134 mg/dL — ABNORMAL HIGH (ref 65–99)
Potassium: 4 mmol/L (ref 3.5–5.1)
SGOT(AST): 15 U/L (ref 15–37)

## 2012-05-31 ENCOUNTER — Ambulatory Visit: Payer: Self-pay | Admitting: Internal Medicine

## 2012-05-31 LAB — COMPREHENSIVE METABOLIC PANEL
Albumin: 3 g/dL — ABNORMAL LOW (ref 3.4–5.0)
BUN: 5 mg/dL — ABNORMAL LOW (ref 7–18)
Bilirubin,Total: 0.6 mg/dL (ref 0.2–1.0)
Calcium, Total: 8.8 mg/dL (ref 8.5–10.1)
Chloride: 104 mmol/L (ref 98–107)
Osmolality: 287 (ref 275–301)
Potassium: 3.2 mmol/L — ABNORMAL LOW (ref 3.5–5.1)
SGOT(AST): 19 U/L (ref 15–37)
Total Protein: 6.1 g/dL — ABNORMAL LOW (ref 6.4–8.2)

## 2012-05-31 LAB — CBC CANCER CENTER
Basophil %: 0.6 %
Eosinophil #: 0 x10 3/mm (ref 0.0–0.7)
HCT: 33.7 % — ABNORMAL LOW (ref 35.0–47.0)
HGB: 11.2 g/dL — ABNORMAL LOW (ref 12.0–16.0)
Lymphocyte %: 11.1 %
MCHC: 33.2 g/dL (ref 32.0–36.0)
MCV: 93 fL (ref 80–100)
Monocyte %: 22.5 %
Neutrophil %: 65.3 %
Platelet: 227 x10 3/mm (ref 150–440)
RDW: 20.1 % — ABNORMAL HIGH (ref 11.5–14.5)
WBC: 6.2 x10 3/mm (ref 3.6–11.0)

## 2012-06-01 LAB — POTASSIUM: Potassium: 3.3 mmol/L — ABNORMAL LOW (ref 3.5–5.1)

## 2012-06-01 LAB — CO2, TOTAL: Co2: 35 mmol/L — ABNORMAL HIGH (ref 21–32)

## 2012-06-02 LAB — CBC CANCER CENTER
Basophil %: 0.5 %
Eosinophil #: 0 x10 3/mm (ref 0.0–0.7)
Eosinophil %: 0.5 %
HCT: 31.5 % — ABNORMAL LOW (ref 35.0–47.0)
HGB: 10.4 g/dL — ABNORMAL LOW (ref 12.0–16.0)
MCH: 31.2 pg (ref 26.0–34.0)
MCHC: 33.2 g/dL (ref 32.0–36.0)
Monocyte #: 0.9 x10 3/mm (ref 0.2–0.9)
Neutrophil #: 3.4 x10 3/mm (ref 1.4–6.5)
Neutrophil %: 67.3 %
RBC: 3.35 10*6/uL — ABNORMAL LOW (ref 3.80–5.20)
RDW: 19.9 % — ABNORMAL HIGH (ref 11.5–14.5)
WBC: 5 x10 3/mm (ref 3.6–11.0)

## 2012-06-02 LAB — COMPREHENSIVE METABOLIC PANEL
Albumin: 2.9 g/dL — ABNORMAL LOW (ref 3.4–5.0)
BUN: 9 mg/dL (ref 7–18)
Calcium, Total: 8.4 mg/dL — ABNORMAL LOW (ref 8.5–10.1)
Co2: 37 mmol/L — ABNORMAL HIGH (ref 21–32)
Creatinine: 0.83 mg/dL (ref 0.60–1.30)
Osmolality: 295 (ref 275–301)
Potassium: 3.5 mmol/L (ref 3.5–5.1)
SGOT(AST): 16 U/L (ref 15–37)
SGPT (ALT): 22 U/L (ref 12–78)
Sodium: 143 mmol/L (ref 136–145)
Total Protein: 5.9 g/dL — ABNORMAL LOW (ref 6.4–8.2)

## 2012-06-04 LAB — BASIC METABOLIC PANEL
Anion Gap: 4 — ABNORMAL LOW (ref 7–16)
BUN: 10 mg/dL (ref 7–18)
Calcium, Total: 8.9 mg/dL (ref 8.5–10.1)
Co2: 36 mmol/L — ABNORMAL HIGH (ref 21–32)
EGFR (Non-African Amer.): >60
Osmolality: 288 (ref 275–301)
Potassium: 4 mmol/L (ref 3.5–5.1)

## 2012-06-05 ENCOUNTER — Ambulatory Visit: Payer: Self-pay | Admitting: Internal Medicine

## 2012-06-07 ENCOUNTER — Ambulatory Visit: Payer: Self-pay | Admitting: Internal Medicine

## 2012-06-07 LAB — CBC CANCER CENTER
Basophil #: 0 x10 3/mm (ref 0.0–0.1)
HCT: 34.1 % — ABNORMAL LOW (ref 35.0–47.0)
Lymphocyte #: 1 x10 3/mm (ref 1.0–3.6)
Lymphocyte %: 17.2 %
MCH: 30 pg (ref 26.0–34.0)
MCHC: 31.7 g/dL — ABNORMAL LOW (ref 32.0–36.0)
MCV: 95 fL (ref 80–100)
Monocyte %: 18.5 %
Neutrophil #: 3.5 x10 3/mm (ref 1.4–6.5)
RBC: 3.61 10*6/uL — ABNORMAL LOW (ref 3.80–5.20)
WBC: 5.5 x10 3/mm (ref 3.6–11.0)

## 2012-06-07 LAB — COMPREHENSIVE METABOLIC PANEL
Calcium, Total: 9 mg/dL (ref 8.5–10.1)
Creatinine: 0.91 mg/dL (ref 0.60–1.30)
EGFR (African American): >60
EGFR (Non-African Amer.): >60
Potassium: 4.1 mmol/L (ref 3.5–5.1)
SGOT(AST): 16 U/L (ref 15–37)
Sodium: 142 mmol/L (ref 136–145)
Total Protein: 6.5 g/dL (ref 6.4–8.2)

## 2012-06-14 ENCOUNTER — Ambulatory Visit: Payer: Self-pay | Admitting: Internal Medicine

## 2012-06-14 LAB — BASIC METABOLIC PANEL
Anion Gap: 6 — ABNORMAL LOW (ref 7–16)
BUN: 12 mg/dL (ref 7–18)
Calcium, Total: 9 mg/dL (ref 8.5–10.1)
Chloride: 104 mmol/L (ref 98–107)
Co2: 32 mmol/L (ref 21–32)
Creatinine: 0.86 mg/dL (ref 0.60–1.30)
EGFR (African American): >60
EGFR (Non-African Amer.): >60
Osmolality: 284 (ref 275–301)
Potassium: 4.4 mmol/L (ref 3.5–5.1)

## 2012-06-14 LAB — CBC CANCER CENTER
Basophil %: 0.6 %
Eosinophil #: 0 x10 3/mm (ref 0.0–0.7)
HCT: 34.7 % — ABNORMAL LOW (ref 35.0–47.0)
HGB: 11.1 g/dL — ABNORMAL LOW (ref 12.0–16.0)
Lymphocyte #: 1.2 x10 3/mm (ref 1.0–3.6)
MCH: 30.4 pg (ref 26.0–34.0)
MCHC: 31.8 g/dL — ABNORMAL LOW (ref 32.0–36.0)
Monocyte #: 1.1 x10 3/mm — ABNORMAL HIGH (ref 0.2–0.9)
Monocyte %: 16.4 %
Neutrophil #: 4.3 x10 3/mm (ref 1.4–6.5)
Neutrophil %: 64 %
Platelet: 149 x10 3/mm — ABNORMAL LOW (ref 150–440)
RBC: 3.64 10*6/uL — ABNORMAL LOW (ref 3.80–5.20)

## 2012-07-06 ENCOUNTER — Ambulatory Visit: Payer: Self-pay | Admitting: Internal Medicine

## 2012-07-14 LAB — CBC WITH DIFFERENTIAL/PLATELET
Bands: 1 %
Eosinophil: 1 %
HGB: 12.6 g/dL (ref 12.0–16.0)
Lymphocytes: 17 %
MCHC: 32.4 g/dL (ref 32.0–36.0)
Monocytes: 8 %
RDW: 14.9 % — ABNORMAL HIGH (ref 11.5–14.5)
Segmented Neutrophils: 70 %
Variant Lymphocyte - H1-Rlymph: 3 %

## 2012-07-14 LAB — CREATININE, SERUM
Creatinine: 0.69 mg/dL (ref 0.60–1.30)
EGFR (African American): >60

## 2012-07-14 LAB — LIPID PANEL
Cholesterol: 160 mg/dL (ref 0–200)
HDL Cholesterol: 40 mg/dL (ref 40–60)
Ldl Cholesterol, Calc: 91 mg/dL (ref 0–100)
Triglycerides: 143 mg/dL (ref 0–200)
VLDL Cholesterol, Calc: 29 mg/dL (ref 5–40)

## 2012-07-14 LAB — LACTATE DEHYDROGENASE: LDH: 190 U/L (ref 81–246)

## 2012-08-05 ENCOUNTER — Ambulatory Visit: Payer: Self-pay | Admitting: Internal Medicine

## 2012-08-18 LAB — CBC CANCER CENTER
Basophil %: 0.2 %
Eosinophil #: 0 x10 3/mm (ref 0.0–0.7)
Eosinophil %: 0.7 %
HCT: 40.8 % (ref 35.0–47.0)
Lymphocyte #: 1.2 x10 3/mm (ref 1.0–3.6)
Lymphocyte %: 42 %
MCH: 30.6 pg (ref 26.0–34.0)
MCHC: 33.2 g/dL (ref 32.0–36.0)
Monocyte #: 0.6 x10 3/mm (ref 0.2–0.9)
Neutrophil #: 1 x10 3/mm — ABNORMAL LOW (ref 1.4–6.5)
Neutrophil %: 35.9 %
Platelet: 134 x10 3/mm — ABNORMAL LOW (ref 150–440)
RBC: 4.43 10*6/uL (ref 3.80–5.20)
WBC: 2.9 x10 3/mm — ABNORMAL LOW (ref 3.6–11.0)

## 2012-08-18 LAB — COMPREHENSIVE METABOLIC PANEL
Albumin: 3.6 g/dL (ref 3.4–5.0)
Alkaline Phosphatase: 85 U/L (ref 50–136)
Anion Gap: 8 (ref 7–16)
BUN: 16 mg/dL (ref 7–18)
Bilirubin,Total: 0.4 mg/dL (ref 0.2–1.0)
Calcium, Total: 9.5 mg/dL (ref 8.5–10.1)
Chloride: 102 mmol/L (ref 98–107)
Co2: 30 mmol/L (ref 21–32)
Creatinine: 0.9 mg/dL (ref 0.60–1.30)
EGFR (African American): >60
Glucose: 277 mg/dL — ABNORMAL HIGH (ref 65–99)
Osmolality: 291 (ref 275–301)
SGPT (ALT): 27 U/L (ref 12–78)
Total Protein: 7 g/dL (ref 6.4–8.2)

## 2012-08-18 LAB — LACTATE DEHYDROGENASE: LDH: 242 U/L (ref 81–246)

## 2012-08-25 LAB — CBC CANCER CENTER
Basophil #: 0 x10 3/mm (ref 0.0–0.1)
Eosinophil #: 0.1 x10 3/mm (ref 0.0–0.7)
HCT: 37 % (ref 35.0–47.0)
HGB: 12.3 g/dL (ref 12.0–16.0)
Lymphocyte #: 1.7 x10 3/mm (ref 1.0–3.6)
MCH: 30.2 pg (ref 26.0–34.0)
Neutrophil %: 5.5 %
Platelet: 125 x10 3/mm — ABNORMAL LOW (ref 150–440)
RDW: 13.4 % (ref 11.5–14.5)
WBC: 2.9 x10 3/mm — ABNORMAL LOW (ref 3.6–11.0)

## 2012-08-31 LAB — CBC CANCER CENTER
Basophil #: 0 x10 3/mm (ref 0.0–0.1)
Eosinophil %: 2.1 %
HCT: 40.4 % (ref 35.0–47.0)
Monocyte #: 0.8 x10 3/mm (ref 0.2–0.9)
Monocyte %: 32.4 %
Neutrophil #: 0.3 x10 3/mm — ABNORMAL LOW (ref 1.4–6.5)
Platelet: 136 x10 3/mm — ABNORMAL LOW (ref 150–440)
RDW: 13.7 % (ref 11.5–14.5)
WBC: 2.5 x10 3/mm — ABNORMAL LOW (ref 3.6–11.0)

## 2012-09-05 ENCOUNTER — Ambulatory Visit: Payer: Self-pay | Admitting: Internal Medicine

## 2012-09-07 LAB — CBC CANCER CENTER
Basophil #: 0 x10 3/mm (ref 0.0–0.1)
HCT: 38.4 % (ref 35.0–47.0)
HGB: 12.5 g/dL (ref 12.0–16.0)
Lymphocyte #: 1.1 x10 3/mm (ref 1.0–3.6)
Lymphocyte %: 37.6 %
MCH: 30.1 pg (ref 26.0–34.0)
MCHC: 32.6 g/dL (ref 32.0–36.0)
MCV: 92 fL (ref 80–100)
Neutrophil #: 1 x10 3/mm — ABNORMAL LOW (ref 1.4–6.5)
Neutrophil %: 33.8 %
Platelet: 134 x10 3/mm — ABNORMAL LOW (ref 150–440)
RBC: 4.16 10*6/uL (ref 3.80–5.20)
RDW: 13.2 % (ref 11.5–14.5)
WBC: 3.1 x10 3/mm — ABNORMAL LOW (ref 3.6–11.0)

## 2012-09-14 LAB — CBC CANCER CENTER
Basophil #: 0 x10 3/mm (ref 0.0–0.1)
Eosinophil #: 0.1 x10 3/mm (ref 0.0–0.7)
Eosinophil %: 1.8 %
HCT: 41 % (ref 35.0–47.0)
HGB: 13.3 g/dL (ref 12.0–16.0)
Lymphocyte %: 35.6 %
MCH: 29.8 pg (ref 26.0–34.0)
MCHC: 32.5 g/dL (ref 32.0–36.0)
MCV: 92 fL (ref 80–100)
Monocyte #: 0.7 x10 3/mm (ref 0.2–0.9)
Neutrophil #: 1.1 x10 3/mm — ABNORMAL LOW (ref 1.4–6.5)
Neutrophil %: 37.6 %
Platelet: 149 x10 3/mm — ABNORMAL LOW (ref 150–440)
RBC: 4.47 10*6/uL (ref 3.80–5.20)
RDW: 13.5 % (ref 11.5–14.5)

## 2012-09-28 LAB — CBC CANCER CENTER
Basophil #: 0 x10 3/mm (ref 0.0–0.1)
Basophil %: 1 %
Eosinophil %: 2.1 %
HCT: 39.8 % (ref 35.0–47.0)
MCH: 29.9 pg (ref 26.0–34.0)
Monocyte %: 18.7 %
Neutrophil #: 1.7 x10 3/mm (ref 1.4–6.5)
Platelet: 135 x10 3/mm — ABNORMAL LOW (ref 150–440)
RBC: 4.4 10*6/uL (ref 3.80–5.20)
RDW: 13.6 % (ref 11.5–14.5)
WBC: 3.9 x10 3/mm (ref 3.6–11.0)

## 2012-10-05 ENCOUNTER — Ambulatory Visit: Payer: Self-pay | Admitting: Internal Medicine

## 2012-10-12 LAB — CBC CANCER CENTER
Basophil #: 0 x10 3/mm (ref 0.0–0.1)
Basophil %: 0.5 %
Eosinophil %: 1.9 %
HCT: 38 % (ref 35.0–47.0)
Lymphocyte #: 1.1 x10 3/mm (ref 1.0–3.6)
Lymphocyte %: 28.3 %
Monocyte #: 0.6 x10 3/mm (ref 0.2–0.9)
Neutrophil %: 54.4 %
Platelet: 156 x10 3/mm (ref 150–440)
RDW: 14 % (ref 11.5–14.5)
WBC: 3.9 x10 3/mm (ref 3.6–11.0)

## 2012-11-02 LAB — CBC CANCER CENTER
Basophil #: 0 x10 3/mm (ref 0.0–0.1)
Basophil %: 0.9 %
Eosinophil #: 0.1 x10 3/mm (ref 0.0–0.7)
Eosinophil %: 2.9 %
HCT: 39 % (ref 35.0–47.0)
HGB: 12.9 g/dL (ref 12.0–16.0)
Lymphocyte #: 0.9 x10 3/mm — ABNORMAL LOW (ref 1.0–3.6)
MCHC: 33.2 g/dL (ref 32.0–36.0)
Monocyte #: 0.6 x10 3/mm (ref 0.2–0.9)
Monocyte %: 18.5 %
Neutrophil #: 1.4 x10 3/mm (ref 1.4–6.5)
Neutrophil %: 47.4 %
Platelet: 140 x10 3/mm — ABNORMAL LOW (ref 150–440)
RBC: 4.25 10*6/uL (ref 3.80–5.20)
WBC: 3 x10 3/mm — ABNORMAL LOW (ref 3.6–11.0)

## 2012-11-02 LAB — LACTATE DEHYDROGENASE: LDH: 158 U/L (ref 81–246)

## 2012-11-02 LAB — CREATININE, SERUM: EGFR (Non-African Amer.): >60

## 2012-11-05 ENCOUNTER — Ambulatory Visit: Payer: Self-pay | Admitting: Internal Medicine

## 2012-11-16 ENCOUNTER — Ambulatory Visit: Payer: Self-pay | Admitting: Internal Medicine

## 2012-12-28 ENCOUNTER — Ambulatory Visit: Payer: Self-pay | Admitting: Internal Medicine

## 2012-12-28 LAB — COMPREHENSIVE METABOLIC PANEL
Alkaline Phosphatase: 69 U/L (ref 50–136)
BUN: 15 mg/dL (ref 7–18)
Bilirubin,Total: 0.6 mg/dL (ref 0.2–1.0)
Calcium, Total: 8.8 mg/dL (ref 8.5–10.1)
Chloride: 106 mmol/L (ref 98–107)
Co2: 29 mmol/L (ref 21–32)
EGFR (African American): >60
EGFR (Non-African Amer.): >60
Osmolality: 283 (ref 275–301)
Potassium: 4.2 mmol/L (ref 3.5–5.1)
Sodium: 140 mmol/L (ref 136–145)
Total Protein: 6.8 g/dL (ref 6.4–8.2)

## 2012-12-28 LAB — CBC CANCER CENTER
Basophil #: 0 x10 3/mm (ref 0.0–0.1)
Basophil %: 0.7 %
Eosinophil #: 0 x10 3/mm (ref 0.0–0.7)
Eosinophil %: 1.1 %
HGB: 13.8 g/dL (ref 12.0–16.0)
Lymphocyte #: 1.3 x10 3/mm (ref 1.0–3.6)
Lymphocyte %: 32.7 %
MCH: 29.6 pg (ref 26.0–34.0)
Monocyte #: 0.6 x10 3/mm (ref 0.2–0.9)
Monocyte %: 14.2 %
Neutrophil #: 2.1 x10 3/mm (ref 1.4–6.5)
Neutrophil %: 51.3 %
Platelet: 149 x10 3/mm — ABNORMAL LOW (ref 150–440)
RDW: 14.3 % (ref 11.5–14.5)
WBC: 4.1 x10 3/mm (ref 3.6–11.0)

## 2012-12-28 LAB — LACTATE DEHYDROGENASE: LDH: 167 U/L (ref 81–246)

## 2012-12-30 ENCOUNTER — Ambulatory Visit: Payer: Self-pay

## 2013-01-05 ENCOUNTER — Ambulatory Visit: Payer: Self-pay | Admitting: Internal Medicine

## 2013-03-01 ENCOUNTER — Ambulatory Visit: Payer: Self-pay | Admitting: Internal Medicine

## 2013-03-01 LAB — HEPATIC FUNCTION PANEL A (ARMC)
Bilirubin, Direct: 0.2 mg/dL (ref 0.00–0.20)
Bilirubin,Total: 0.8 mg/dL (ref 0.2–1.0)
SGPT (ALT): 26 U/L (ref 12–78)

## 2013-03-01 LAB — CBC CANCER CENTER
Basophil #: 0 x10 3/mm (ref 0.0–0.1)
Eosinophil #: 0.1 x10 3/mm (ref 0.0–0.7)
Eosinophil %: 2.1 %
MCH: 30.4 pg (ref 26.0–34.0)
MCHC: 32.7 g/dL (ref 32.0–36.0)
Monocyte #: 0.7 x10 3/mm (ref 0.2–0.9)
Monocyte %: 12.5 %
Neutrophil #: 3.9 x10 3/mm (ref 1.4–6.5)
Neutrophil %: 67 %
Platelet: 155 x10 3/mm (ref 150–440)
RBC: 4.52 10*6/uL (ref 3.80–5.20)
RDW: 13.9 % (ref 11.5–14.5)

## 2013-03-01 LAB — CREATININE, SERUM
Creatinine: 0.81 mg/dL (ref 0.60–1.30)
EGFR (African American): >60

## 2013-03-01 LAB — LACTATE DEHYDROGENASE: LDH: 177 U/L (ref 81–246)

## 2013-03-07 ENCOUNTER — Ambulatory Visit: Payer: Self-pay | Admitting: Internal Medicine

## 2013-04-12 ENCOUNTER — Ambulatory Visit: Payer: Self-pay | Admitting: Internal Medicine

## 2013-04-12 LAB — CBC CANCER CENTER
BASOS PCT: 0.8 %
Basophil #: 0 x10 3/mm (ref 0.0–0.1)
Eosinophil #: 0.1 x10 3/mm (ref 0.0–0.7)
Eosinophil %: 1.7 %
HCT: 41.6 % (ref 35.0–47.0)
HGB: 13.6 g/dL (ref 12.0–16.0)
Lymphocyte #: 1 x10 3/mm (ref 1.0–3.6)
Lymphocyte %: 23.8 %
MCH: 30.2 pg (ref 26.0–34.0)
MCHC: 32.7 g/dL (ref 32.0–36.0)
MCV: 93 fL (ref 80–100)
MONO ABS: 0.7 x10 3/mm (ref 0.2–0.9)
MONOS PCT: 15.4 %
NEUTROS PCT: 58.3 %
Neutrophil #: 2.5 x10 3/mm (ref 1.4–6.5)
Platelet: 146 x10 3/mm — ABNORMAL LOW (ref 150–440)
RBC: 4.5 10*6/uL (ref 3.80–5.20)
RDW: 13.3 % (ref 11.5–14.5)
WBC: 4.3 x10 3/mm (ref 3.6–11.0)

## 2013-04-12 LAB — LACTATE DEHYDROGENASE: LDH: 173 U/L (ref 81–246)

## 2013-04-12 LAB — CREATININE, SERUM
Creatinine: 0.79 mg/dL (ref 0.60–1.30)
EGFR (African American): >60
EGFR (Non-African Amer.): >60

## 2013-04-27 ENCOUNTER — Ambulatory Visit: Payer: Self-pay | Admitting: Internal Medicine

## 2013-05-08 ENCOUNTER — Ambulatory Visit: Payer: Self-pay | Admitting: Internal Medicine

## 2013-07-05 ENCOUNTER — Ambulatory Visit: Payer: Self-pay | Admitting: Internal Medicine

## 2013-07-06 ENCOUNTER — Ambulatory Visit: Payer: Self-pay | Admitting: Internal Medicine

## 2014-04-10 ENCOUNTER — Inpatient Hospital Stay: Payer: Self-pay | Admitting: Specialist

## 2014-04-10 LAB — CBC
HCT: 36.3 % (ref 35.0–47.0)
HGB: 11.6 g/dL — AB (ref 12.0–16.0)
MCH: 30.8 pg (ref 26.0–34.0)
MCHC: 32 g/dL (ref 32.0–36.0)
MCV: 96 fL (ref 80–100)
Platelet: 154 10*3/uL (ref 150–440)
RBC: 3.78 10*6/uL — AB (ref 3.80–5.20)
RDW: 16.1 % — ABNORMAL HIGH (ref 11.5–14.5)
WBC: 17.6 10*3/uL — ABNORMAL HIGH (ref 3.6–11.0)

## 2014-04-10 LAB — URINALYSIS, COMPLETE
BILIRUBIN, UR: NEGATIVE
Bacteria: NONE SEEN
Glucose,UR: NEGATIVE mg/dL (ref 0–75)
Granular Cast: 2
Hyaline Cast: 97
Ketone: NEGATIVE
LEUKOCYTE ESTERASE: NEGATIVE
Nitrite: NEGATIVE
Ph: 5 (ref 4.5–8.0)
Protein: 30
Specific Gravity: 1.019 (ref 1.003–1.030)
WBC UR: 4 /HPF (ref 0–5)

## 2014-04-10 LAB — BASIC METABOLIC PANEL
Anion Gap: 9 (ref 7–16)
BUN: 29 mg/dL — ABNORMAL HIGH (ref 7–18)
CALCIUM: 8.5 mg/dL (ref 8.5–10.1)
CO2: 26 mmol/L (ref 21–32)
CREATININE: 1.2 mg/dL (ref 0.60–1.30)
Chloride: 101 mmol/L (ref 98–107)
EGFR (Non-African Amer.): 47 — ABNORMAL LOW
GFR CALC AF AMER: 57 — AB
Glucose: 208 mg/dL — ABNORMAL HIGH (ref 65–99)
Osmolality: 284 (ref 275–301)
Potassium: 4.6 mmol/L (ref 3.5–5.1)
Sodium: 136 mmol/L (ref 136–145)

## 2014-04-10 LAB — TROPONIN I: Troponin-I: <0.02 ng/mL

## 2014-04-11 LAB — CBC WITH DIFFERENTIAL/PLATELET
BASOS ABS: 0 10*3/uL (ref 0.0–0.1)
Basophil %: 0.1 %
Eosinophil #: 0 10*3/uL (ref 0.0–0.7)
Eosinophil %: 0 %
HCT: 31.8 % — ABNORMAL LOW (ref 35.0–47.0)
HGB: 10.4 g/dL — ABNORMAL LOW (ref 12.0–16.0)
Lymphocyte #: 0.4 10*3/uL — ABNORMAL LOW (ref 1.0–3.6)
Lymphocyte %: 4.5 %
MCH: 31.4 pg (ref 26.0–34.0)
MCHC: 32.7 g/dL (ref 32.0–36.0)
MCV: 96 fL (ref 80–100)
MONO ABS: 0.4 x10 3/mm (ref 0.2–0.9)
MONOS PCT: 4.4 %
Neutrophil #: 8.4 10*3/uL — ABNORMAL HIGH (ref 1.4–6.5)
Neutrophil %: 91 %
PLATELETS: 125 10*3/uL — AB (ref 150–440)
RBC: 3.31 10*6/uL — ABNORMAL LOW (ref 3.80–5.20)
RDW: 16 % — AB (ref 11.5–14.5)
WBC: 9.2 10*3/uL (ref 3.6–11.0)

## 2014-04-11 LAB — BASIC METABOLIC PANEL
ANION GAP: 9 (ref 7–16)
BUN: 28 mg/dL — AB (ref 7–18)
CHLORIDE: 105 mmol/L (ref 98–107)
CO2: 24 mmol/L (ref 21–32)
CREATININE: 1.18 mg/dL (ref 0.60–1.30)
Calcium, Total: 8.1 mg/dL — ABNORMAL LOW (ref 8.5–10.1)
EGFR (African American): 59 — ABNORMAL LOW
EGFR (Non-African Amer.): 48 — ABNORMAL LOW
Glucose: 352 mg/dL — ABNORMAL HIGH (ref 65–99)
Osmolality: 295 (ref 275–301)
POTASSIUM: 3.9 mmol/L (ref 3.5–5.1)
Sodium: 138 mmol/L (ref 136–145)

## 2014-04-15 LAB — CULTURE, BLOOD (SINGLE)

## 2014-05-12 IMAGING — PT NM PET TUM IMG INITIAL (PI) SKULL BASE T - THIGH
5 series · 23 of 25 positions shown · non-contrast
Comparison: none

REASON FOR EXAM: B Cell lymphoma
COMMENTS:

[Series 3: ct wb 3.0 b30f · axial · 3.0mm · 0.98mm/px · z∈[-1376,-508]mm · 10 of 435 slices shown]
[im 1/435]
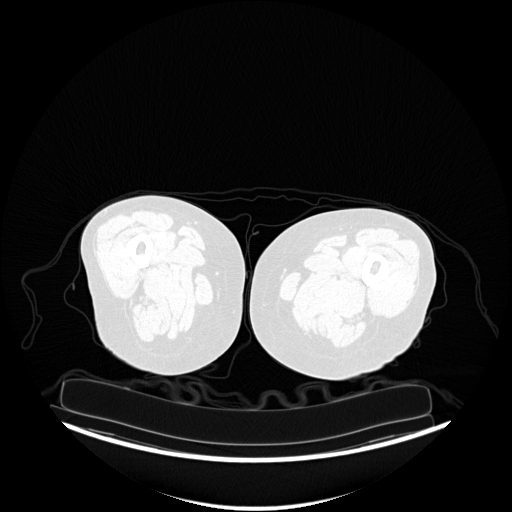
[im 44/435]
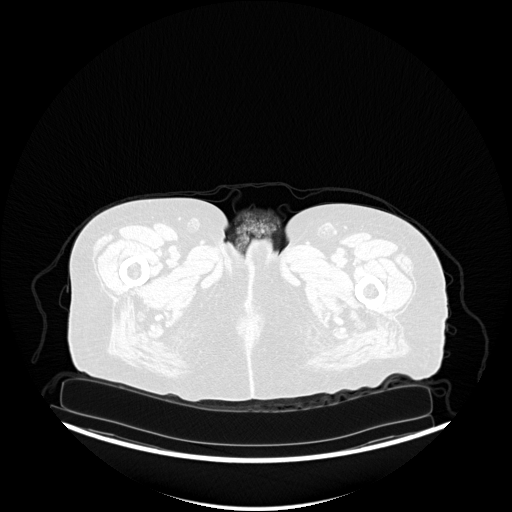
[im 87/435]
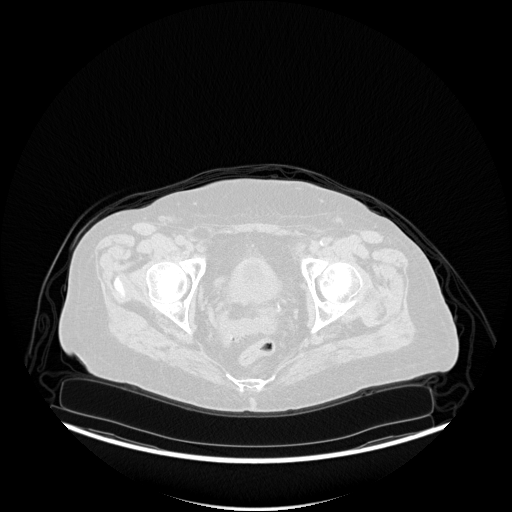
[im 131/435]
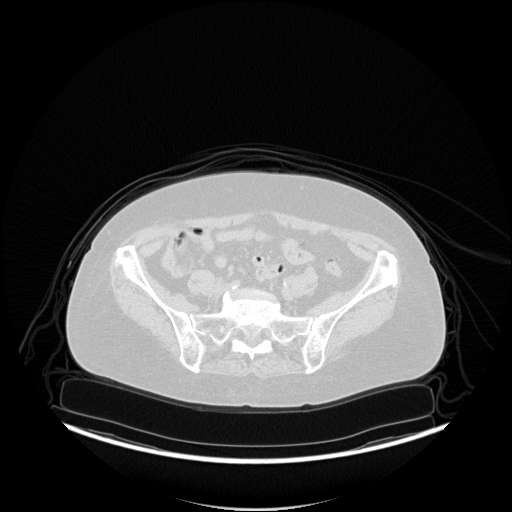
[im 174/435]
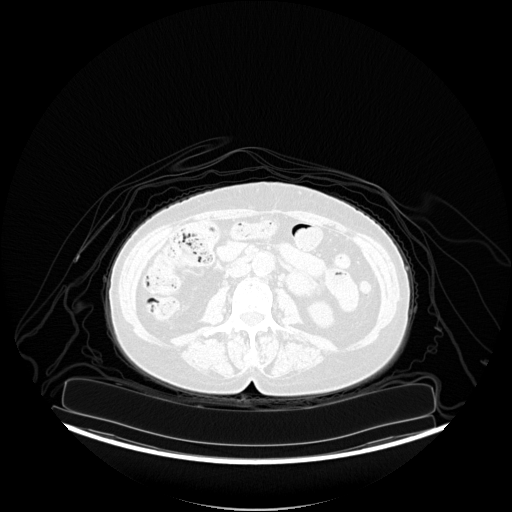
[im 218/435]
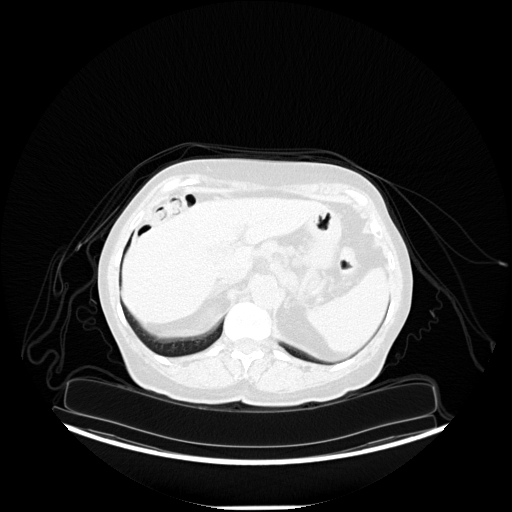
[im 304/435]
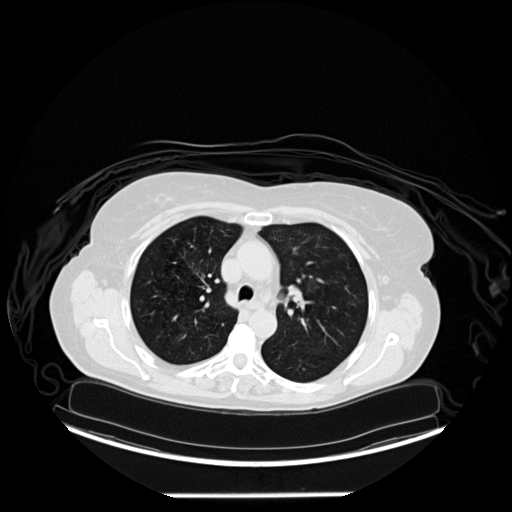
[im 348/435]
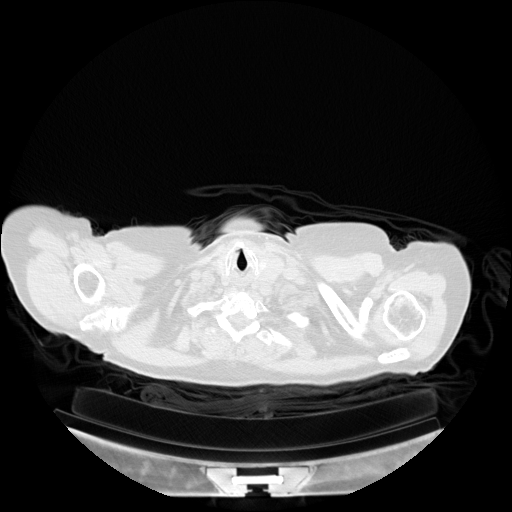
[im 391/435]
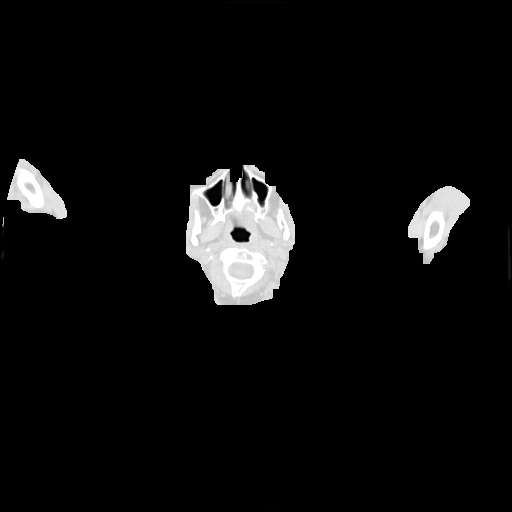
[im 435/435  brain]
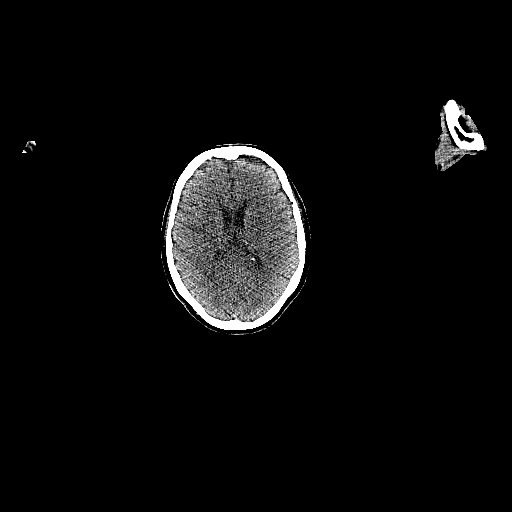

[Series 102: pet wb · axial · 5.0mm · 4.07mm/px · z∈[-1376,-508]mm · 7 of 290 slices shown]
[im 1/290]
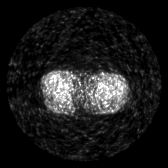
[im 49/290]
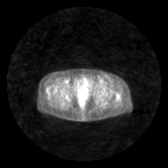
[im 97/290]
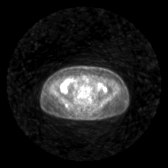
[im 145/290]
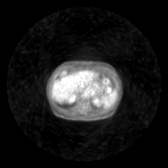
[im 193/290]
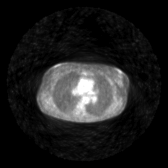
[im 241/290]
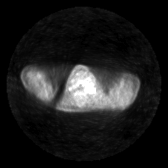
[im 290/290]
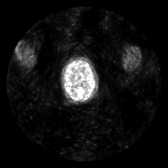

[Series 603: pet axial · 3 of 167 slices shown]
[im 56/167]
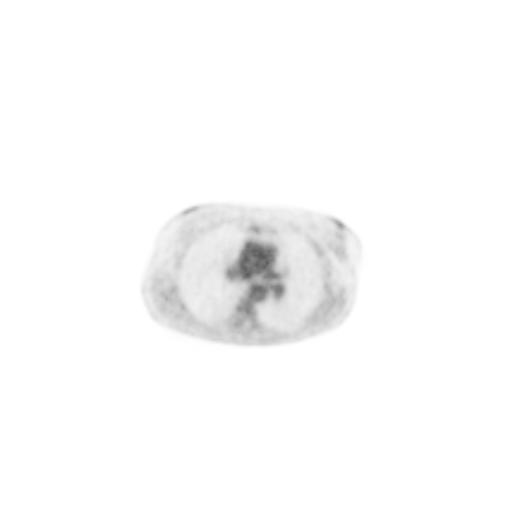
[im 111/167]
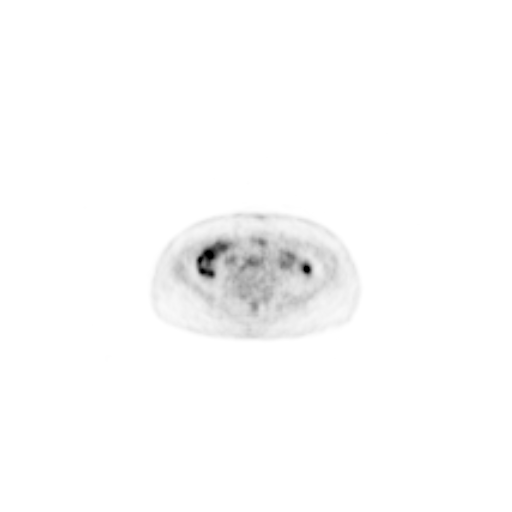
[im 167/167]
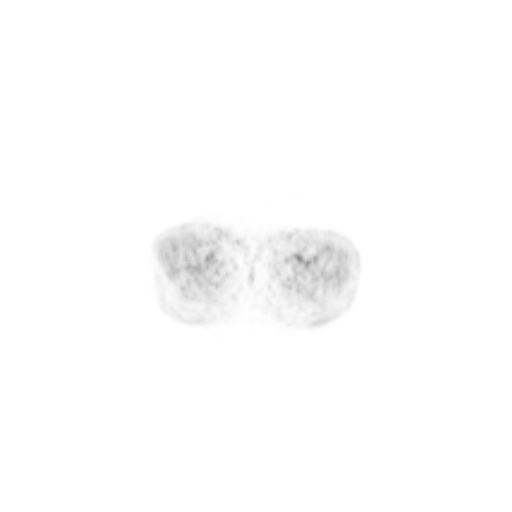

[Series 604: pet coronal · 1 of 52 slices shown]
[im 1/52]
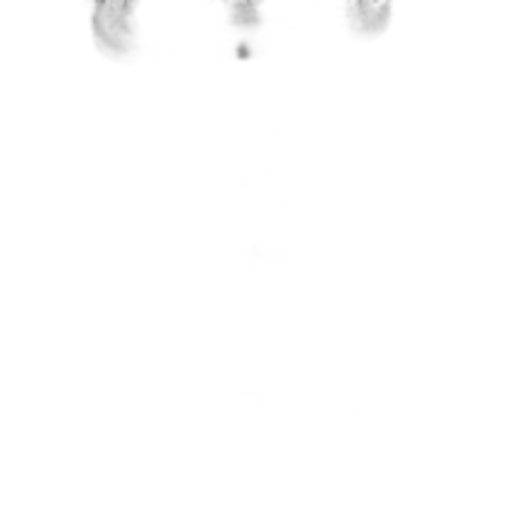

[Series 605: pet sagittal · 2 of 76 slices shown]
[im 1/76]
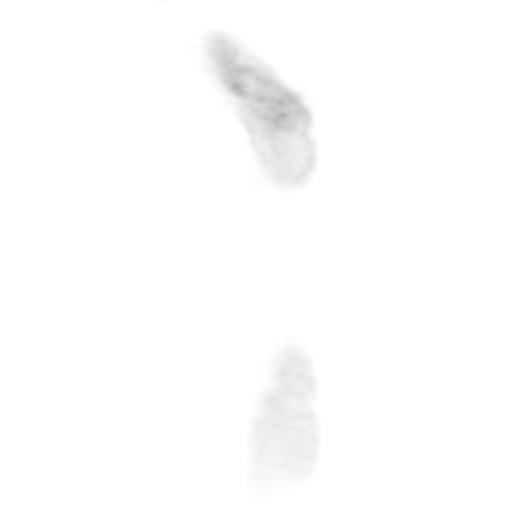
[im 76/76]
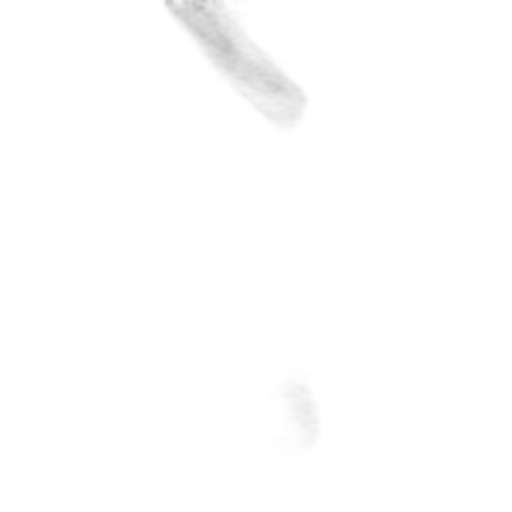

[23 of 25 positions shown; findings below may reference images not displayed]

PROCEDURE:     PET - PET/CT INIT STAGING LYMPHOMA  - December 18, 2011 [DATE]

RESULT:     The patient is undergoing initial staging of lymphoma. The
patient's fasting blood glucose level was 143 mg/dL. The patient received
12.02 mCi F-18 labeled FDG at [DATE] a.m. with scanning beginning at [DATE] a.m.
A noncontrast CT scan was performed at the same sitting for coregistration
and attenuation correction.

Within the neck predominantly on the left there is a large lymph node mass.
This exhibits significantly increased uptake. The maximal SUV is 13.4 with a
mean of 8.3. This nodal mass extends inferiorly into the supraclavicular
region.

On the right there are smaller discretely demonstrated lymph nodes along the
angle of the mandible and jugular regions. These exhibit SUV of
approximately 4.2 maximally with a mean of 2.3.

Within the thorax there is a focal area of increased uptake noted in the
manubrium which exhibits an SUV of 5.3 with a mean of 2.9. Within the
mediastinum and hilar regions there is no abnormal uptake. No abnormal
pulmonary parenchymal uptake is seen. In the right supraclavicular region
there is increased uptake in a lymph node exhibiting uptake of 8.1 maximally
with a mean of 4.9.

Within the abdomen there is normal expected urinary tract uptake. There is
considerable bowel uptake especially on the right and in the pelvis.
Discrete lymph node masses are not identified. The periaortic and pericaval
regions demonstrate no abnormal uptake. There is increased uptake within the
left adrenal gland with an adjacent lymph node with maximal SUV 2.6 with a
mean of 1.7.

There is abnormal soft tissue density in the right aspect of the body of the
sacrum which exhibits intensely increased uptake with maximal SUV of
with a mean of 6.9. In the lateral aspect of the right iliac bone there is a
focus of increased uptake exhibiting an SUV of 4.5 with a mean of 2.5. In
the left ischium a focus of increased uptake exhibits a maximal SUV of
with a mean of 2.6. Increased uptake in the intertrochanteric region of the
left femur exhibits a maximal SUV of 10.1 with a mean of 6.2.

The CT images exhibit no pulmonary parenchymal masses. Within the abdomen
there is failure to taper of the caliber of the abdominal aorta with maximal
measured diameter of 2.2 cm.
IMPRESSION: 1. There is abnormal uptake within lymph nodes in the neck bilaterally.
2. There is abnormal uptake within the sacrum and bony pelvis.
3. There is low level increased uptake in the left adrenal gland.
4. A moderate amount of bowel activity is present especially in the pelvis.

[REDACTED]

## 2014-07-25 NOTE — Op Note (Signed)
PATIENT NAME:  Nina Hudson, Nina Hudson MR#:  [id number] DATE OF BIRTH:  [birth year]  DATE OF PROCEDURE:  12/03/2011  PREOPERATIVE DIAGNOSIS: Left cervical lymphadenopathy with lymphoma.   POSTOPERATIVE DIAGNOSIS: Left cervical lymphadenopathy with lymphoma.   PROCEDURES:  1. Excisional biopsy of left deep cervical lymph node.  2. Direct laryngoscopy.  3. Diagnostic nasopharyngoscopy.   SURGEON: Sammuel Hines. Richardson Landry, MD  ANESTHESIA: General endotracheal.   INDICATIONS: 69 year old female with a left neck mass with fine needle aspiration suggesting lymphoma. She is brought to the Operating Room for excision of further tissue for more studies. She had also had some fullness in the left tongue base and abnormality in the nasopharynx noted preoperatively so these areas will be re-evaluated.   FINDINGS: Patient had a large matted group of lymph nodes in the left neck. A 2 cm node was removed and sent to the pathologist and found to be positive for lymphoma. The pathologist felt this material was adequate for studies they required. There was some fullness of lymphoid tissue seen in the base of the tongue and in the nasopharynx, however, nothing that was felt significantly abnormal given the clinical situation that additional biopsies might provide further information. This looked like typical prominent lymphoid tissue which would be expected in the case of lymphoma and not any additional type of neoplasm. There was no ulceration or erosion to suggest anything such as squamous cell carcinoma.   COMPLICATIONS: None.   DESCRIPTION OF PROCEDURE: After obtaining informed consent, the patient was taken to the Operating Room, placed in supine position. After induction of general endotracheal anesthesia, patient was turned 90 degrees. Direct laryngoscopy was performed first. She was placed on a shoulder roll with the head extended and a tooth guard placed. Dedo laryngoscope was used to evaluate the airway. The  hypopharynx, larynx and tongue base were all carefully inspected. There was some prominence of lymphoid tissue in the left tongue base compared to the right but certainly no large fungating masses, ulceration or erosion of any kind. There is no evidence of any leukoplakia. The tongue base was palpated and all the tissue was a bit more prominent on the left side. It was not different in consistency from the right. Given the fact that we were going for a larger lymph node for diagnosis and had a preoperative diagnosis of lymphoma based on fine needle aspiration it was felt no need to do additional biopsies given the clinical situation. The left neck was then prepped and draped in the usual sterile fashion after injecting the skin with lidocaine with epinephrine 1:200,000. Skin was incised with a #15 blade and incision carried down through the subcutaneous fatty tissues and platysma with the Harmonic scalpel. The sternocleidomastoid muscle was identified and the fascia divided along its anterior border gaining access to the compartment containing the lymph nodes. Immediately a large hemorrhagic lymph node was encountered. This was not the largest of the masses but it was adherent to the larger lymph node mass complex. This node was approximately 2 cm in size. It is quite friable, clearly abnormal. It was carefully dissected out with the Harmonic scalpel and removed and sent as a fresh specimen for pathology. I did proceed with some dissection of the larger lymph node complex, however, I received call from pathology confirming that this was indeed lymphoma and they did not feel they needed additional tissue for studies. Given this and the potential for added morbidity by further lymph node dissection this very large nodal complex, there was  felt no need to put the patient at that risk since the diagnosis had been adequately obtained. The wound was irrigated and a #10 TLS drain placed and secured with a 5-0 Prolene  suture. The platysma was closed with 4-0 Vicryl suture. The subcutaneous tissues were closed with 4-0 Vicryl suture. The skin was then closed with 5-0 Prolene in running lock stitch. Nasal endoscopy was then performed before waking the patient and performing nasopharyngoscopy to assess the nasopharynx. Dr. Carlis Abbott had seen some abnormalities in the nasopharynx. While there was some prominent lymphoid tissue for a patient of her age again there were no impressive mass lesions, ulceration or leukoplakia to suggest a diagnosis other than lymphoma. There was felt no need to provide further biopsies for diagnosis. Patient was returned to the anesthesiologist for awakening. She was awakened and taken to the recovery room in good condition postoperatively. Blood loss was less than 25 mL.  ____________________________ Sammuel Hines. Richardson Landry, MD psb:cms D: 12/03/2011 09:10:11 ET T: 12/03/2011 11:24:41 ET JOB#: [id number]  cc: Sammuel Hines. Richardson Landry, MD, <Dictator> Riley Nearing MD ELECTRONICALLY SIGNED 12/16/2011 8:35

## 2014-07-25 NOTE — H&P (Signed)
PATIENT NAME:  Nina Hudson, Nina Hudson MR#:  [id number] DATE OF BIRTH:  1945/06/14  DATE OF ADMISSION:  02/22/2012  HISTORY OF PRESENT ILLNESS: Nina Hudson is a patient well known to me, 69 years old, who was admitted on Sunday evening, 02/22/2012, and started on intravenous hydration in preparation for chemotherapy on 02/23/2012. I saw and evaluated Nina Hudson on 02/23/2012, and an extensive note was placed on the chart on that day, and this full narrative of the events of that day was delayed until the current time. This patient has a history of diffuse large cell lymphoma, initially diagnosed by node excision on 08/28 of this year. History also included rheumatoid arthritis, in the past on Humira, and in the past on methotrexate. 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 The patient also has a history of atrial fibrillation that developed after the first cycle of chemotherapy with Rituxan and CHOP.   FAMILY HISTORY: Negative and noncontributory, although a cousin has colon cancer.   SOCIAL HISTORY: Negative for alcohol, although the patient was a smoker with a 20 pack-year history.   PAST MEDICAL HISTORY: In addition, the patient has diabetes and hypercholesterolemia. The patient has had a colonoscopy in the past.   ALLERGIES: Sulfa caused hives.   HOME MEDICATIONS ON ADMISSION THAT WERE CONTINUED:  1. Magnesium oxide 400 mg twice a day. 2. Potassium 1 tablet daily.  3. Alprazolam 0.5 mg 1 tablet twice a day.  4. Had prior been on metformin which was discontinued.   REVIEW OF SYSTEMS: At the time of admission, the patient's general strength, energy, and performance status was really excellent. There had been none the night before and none on the day was seen headache, dizziness, chills, or sweats. The patient had some recent postnasal drip, some congestion and cough, nonproductive without any shortness of breath or wheezing. No hemoptysis. No nausea or vomiting. No abdominal pain. No  dysuria or hematuria. No change in bowel habits. No nausea. Appetite has been normal. Sugars wax and wane, had been high at home. No dysuria or hematuria. No extremity edema. No back or bone pain. No rash or bruising. Mood is positive. No depression. Some anxiety.   PHYSICAL EXAMINATION:   GENERAL: The patient was alert and cooperative and in no acute distress. Minimal pallor. No jaundice.   HEENT: Sclerae clear.   MOUTH: No thrush.   NECK: No mass in the neck.   LYMPH: No palpable lymph nodes in the neck, supraclavicular, axillary, inguinal, submandibular region or femoral.  HEART: Regular.   ABDOMEN: Nontender. No palpable mass or organomegaly.   EXTREMITIES: No extremity edema. No rash. No cyanosis.   NEUROLOGIC: Grossly nonfocal. Cranial nerves are intact. Gait normal. Normal appearance. Mood and affect.   LABS: The lab results when admitted the previous evening, 02/22/2012, revealed sugar high at 520, sodium 138, and potassium 4.0. Liver chemistries normal. Albumin 3.0, calcium 8.2, and creatinine 0.66. Magnesium 2.0. LDH 208. Initial urine pH was 7.0, when first measured.   IMPRESSION AND PLAN: The patient is with underlying lymphoma, also diabetes, status post two cycles of Rituxan and CHOP. She has prior atrial fibrillation and this has been in sinus rhythm and then rate controlled atrial fibrillation with Cardizem CD 120 mg p.o. twice a day. The patient was admitted initially on the evening of 02/22/2012 and started on high volume fluids with initially D5 half normal saline and bicarbonate, but the patient's fluid was changed to half normal saline with 60  mEq of bicarbonate per liter and given at 200 mL per hour. Urine pH was monitored. The patient was seen and evaluated on the morning of 02/23/2012. Fluids were continued, urine pH was monitored, and chemotherapy treatment was planned with high-dose methotrexate. The calculation is that the full dose is 3500 mg/m/sq. That is reduced  by 25% for creatinine clearance between 60 and 80. At that time it was 1.65 m/sq with 4330 mg of methotrexate given in a three hour infusion that was ultimately started at 1400 hours. Careful protocol was followed whereby the urine pH was alkalized to over 7 before initiating the treatment, and was maintained alkalined for 72 hours, high-volume fluids were maintained, urine output was watched, and 24 hours from the initiation of high-dose methotrexate leucovorin was planned, initially at 30 mg intravenously, to be given every six hours. Time was 48 to 72 hours or until the methotrexate level was less than 0.1 micromoles. ____________________________ Simonne Come Inez Pilgrim, MD rgg:slb D: 03/10/2012 23:58:00 ET T: 03/11/2012 06:58:58 ET JOB#: [id number]  cc: Simonne Come. Inez Pilgrim, MD, <Dictator> Dallas Schimke MD ELECTRONICALLY SIGNED 03/23/2012 22:01

## 2014-07-25 NOTE — Consult Note (Signed)
PATIENT NAME:  Nina Hudson, MAHAN MR#:  [id number] DATE OF BIRTH:  1945-07-15  DATE OF CONSULTATION:  02/26/2012  REFERRING PHYSICIAN:  Barbette Reichmann, MD  CONSULTING PHYSICIAN:  A. Lavone Orn, MD  CHIEF COMPLAINT: Uncontrolled diabetes.   HISTORY OF PRESENT ILLNESS: This is a 69 year old female seen in consultation for uncontrolled diabetes. She has stage IV lymphoma and is admitted for high-dose methotrexate therapy. She has had diabetes since 2009. As an outpatient, she was taking metformin 500 mg 2 tabs b.i.d. Metformin is currently held due to risk of nephrotoxicity from her other medications. She has no known complications from diabetes. Her appetite has been poor. She denies nausea. She reports a metallic taste in her mouth. She has lost about 34 pounds since starting chemotherapy about 10 weeks ago. She was checking her blood sugars at home prior to admission and recalls that they were typically high and in the range of 200 to 400. She has been eating three meals a day, although her meals have been smaller. Since admission, she has been treated with a regular insulin sliding scale. Her blood sugars have been in the range of 214 to 414 in the last 24 hours. In reviewing her total daily use of insulin, it has typically been about 32 units with sugars maintained in the 200 to  400 range.   PAST MEDICAL HISTORY:  1. Type 2 diabetes.  2. Stage IV lymphoma.  3. Hypertension.  4. Hyperlipidemia.  5. Atrial fibrillation.   ALLERGIES: No known drug allergies.    INPATIENT MEDICATIONS:  1. Regular insulin sliding scale.  2. Leucovorin 100 mg every 6 hours.  3. Diltiazem ER 120 mg daily.  4. Lactulose 15 mg every 12 hours.  5. Lisinopril 20 mg daily.  6. Magnesium 400 mg b.i.d.  7. Potassium ER 10 mEq daily.  8. Normal saline 0.45 at 100 mL/h.   SOCIAL HISTORY: No alcohol or tobacco use.   FAMILY HISTORY: Positive for hypertension and diabetes.   REVIEW OF SYSTEMS: GENERAL: She has had  weight loss. She denies fevers. HEENT: She denies blurred vision. She denies sore throat. NECK: She denies neck pain. No dysphagia. CARDIAC: She denies chest pain. She has intermittent palpitations. PULMONARY: She denies cough or shortness of breath. ABDOMEN: Poor appetite. No abdominal pain. EXTREMITIES: She denies leg swelling. SKIN: She has had eruption of a rash on her legs for which she has seen Dermatology.  She denies pruritus. ENDOCRINE: She denies heat or cold intolerance. GU: She denies polyuria or dysuria.   PHYSICAL EXAMINATION:  VITAL SIGNS: Height 64 inches, weight 132 pounds, BMI 22. Temperature 97.3, pulse 88, respirations 18, blood pressure 118/62 on room air.   GENERAL: A well-developed white female.   HEENT: Extraocular movements are intact. Oropharynx is clear. Mucous membranes are moist.   NECK: Supple. No thyromegaly.   CARDIAC: Regular rate and rhythm. No carotid bruit.   PULMONARY: Clear bilaterally. No wheeze. Good inspiratory effort.   ABDOMEN: Diffusely soft, nontender, nondistended.   EXTREMITIES: No edema is present.   SKIN: There is a macular erythematous rash on the lower extremities. She has an indwelling catheter in the left upper chest wall.   PSYCHIATRIC: Alert and oriented, calm and cooperative   LABORATORY, DIAGNOSTIC AND RADIOLOGICAL DATA: Glucose 251, BUN 6, creatinine 0.49, sodium 140, potassium 3.7, chloride 104, anion gap 6, serum osmolality 285, serum calcium 8.2.   ASSESSMENT: 69 year old female with stage IV lymphoma, receiving high dose methotrexate plus periodic chemotherapy. She  did receive a dose of Decadron four days ago, and she will receive daily high-dose steroids starting next Monday. Her type 2 diabetes is uncontrolled and without complications.   RECOMMENDATIONS: Recommend initiation of basal/bolus insulin therapy. Based on her recent total daily insulin requirements, I recommend starting with Lantus 35 units at bedtime and NovoLog 12  units t.i.d. plus a NovoLog insulin sliding scale. We discussed use of insulin, how to use insulin pens, how to store the pens, and precautions for the pens. Her husband is on insulin and she is familiar with the use of insulin injections through vials and pens. She was given prescriptions for the insulin, and we reviewed the dosages at length. I suspect she may need higher dosages next week when she gets steroids, so she will need to contact me at that time to let me know how she is doing. I did remind the patient that next week is the Thanksgiving holiday and the office will be closed on Wednesday, Thursday and Friday. She was not interested in coming in on Monday or Tuesday, and she anticipates feeling poorly after she receives her chemotherapy. I would like to see her in clinic in 2 to 3 weeks if she is feeling up to it. Her metformin should continue to be held for now. We also discussed the benefits of a low carbohydrate diet. She needs to avoid concentrated sweets. She would benefit from monitoring blood sugars before meals and at bedtime and eating at least three meals per day.   Thank you for the kind request for consultation.  ____________________________ A. Lavone Orn, MD ams:cbb D: 02/26/2012 15:10:56 ET T: 02/26/2012 17:11:15 ET JOB#: [id number]  cc: A. Lavone Orn, MD, <Dictator>  Sherlon Handing MD ELECTRONICALLY SIGNED 02/28/2012 19:42

## 2014-07-25 NOTE — H&P (Signed)
PATIENT NAME:  Nina Hudson, Nina Hudson MR#:  [id number] DATE OF BIRTH:  03-30-1946  DATE OF ADMISSION:  01/12/2012  CHIEF COMPLAINT: B-cell lymphoma.   HISTORY OF PRESENT ILLNESS: This is a patient with a recent diagnosis of B-cell lymphoma here for elective port placement. She has been counseled in the office concerning her risks and options, has rescheduled this procedure several times for personal reasons, including a second opinion at [facility] and a trip to of the beach, I believe. She is here for elective port placement.   PAST MEDICAL HISTORY:  1. Hypertension.  2. Rheumatoid arthritis.  3. Uterine cancer. 4. Diabetes.   PAST SURGICAL HISTORY:  1. Total abdominal hysterectomy with bilateral salpingo-oophorectomy.  2. Appendectomy. 3. Hand nodule excision.  4. Fractured ankle. 5. Left neck node biopsy.   MEDICATIONS: Multiple, see chart.   ALLERGIES: Sulfa.   FAMILY HISTORY: Noncontributory.   SOCIAL HISTORY: The patient is a nonsmoker, nondrinker.   REVIEW OF SYSTEMS: 10 system review is documented in the office.  PHYSICAL EXAMINATION:  GENERAL. Healthy female patient.  NECK:  No palpable neck nodes. There is an incision over the previously excised neck node that is well healed.   EXTREMITIES: Without edema. Calves are nontender.   NEUROLOGIC: Grossly intact.   INTEGUMENT: No jaundice.   CHEST: Clear to auscultation.   CARDIAC: Regular rate and rhythm.   ASSESSMENT AND PLAN: This is a patient with biopsy proven B-cell lymphoma. Dr. Inez Pilgrim has requested port placement. I discussed with the patient the rationale for surgery, the options of observation, the risks of bleeding, infection, recurrence of disease, pneumothorax, hemopneumothorax, any of which could require further surgery, chest tube placement, emergency thoracotomy, exsanguination, and death. This was reviewed for her and will be reviewed for her again in the preop holding area. She has had a husband who had a port  placement without difficulty in the past, but I have reviewed for her these risks as well and they will be reviewed again prior to surgery.    ____________________________ Jerrol Banana Burt Knack, MD rec:bjt D: 01/11/2012 22:13:44 ET T: 01/12/2012 07:44:43 ET JOB#: [id number]  cc: Jerrol Banana. Burt Knack, MD, <Dictator> Florene Glen MD ELECTRONICALLY SIGNED 01/13/2012 16:12

## 2014-07-25 NOTE — Consult Note (Signed)
PATIENT NAME:  Nina Hudson, Nina Hudson MR#:  [id number] DATE OF BIRTH:  [birth year]  DATE OF CONSULTATION:  02/24/2012  REFERRING PHYSICIAN:  Dr. Inez Pilgrim  CONSULTING PHYSICIAN:  Corey Skains, MD  REASON FOR CONSULTATION: Tachycardia, history of hypertension and diabetes.   CHIEF COMPLAINT: "I'm short of breath".   HISTORY OF PRESENT ILLNESS: This is a 69 year old female with known history of atrial fibrillation with rapid ventricular rate but has been maintaining normal sinus rhythm recently without need in additional medication management. The patient has had a previous echocardiogram showing normal LV systolic function with mild mitral and moderate tricuspid regurgitation. The patient is currently receiving chemotherapy for significant cancer and has been tolerating it fairly well. She had been ambulating with some minimal palpitation but no other significant symptoms when there was concerns of tachycardia. After further evaluation, the telemetry shows normal sinus rhythm without evidence of significant rhythm disturbances. She does have hypertension well controlled on ACE inhibitor as well as hyperlipidemia treated with simvastatin. The patient has had diabetes as well as which is well controlled with injection. There has been no other congestive heart failure or true angina at this time.   Remainder review of systems negative for vision change, ringing in the ears, hearing loss, cough, congestion, heartburn, nausea, vomiting, diarrhea, bloody stools, stomach pain, extremity pain, leg weakness, cramping of the buttocks, known blood clots, headaches, blackouts, dizzy spells, nosebleeds, congestion, trouble swallowing, frequent urination, urination at night, muscle weakness, numbness, anxiety, depression, skin lesions, skin rashes.   PAST MEDICAL HISTORY:  1. Hypertension.  2. Hyperlipidemia.  3. Diabetes mellitus.  4. Atrial fibrillation.   FAMILY HISTORY: Grandmother had diabetes. Father had high  blood pressure.   SOCIAL HISTORY: Currently denies alcohol or tobacco use.   ALLERGIES: No known drug allergies.   CURRENT MEDICATIONS: As listed.   PHYSICAL EXAMINATION:   VITAL SIGNS: Blood pressure 110/65 bilaterally, heart rate 72 upright, reclining, and regular.   GENERAL: She is a well appearing female in no acute distress.   HEENT: No icterus, thyromegaly, ulcers, hemorrhage, or xanthelasma.   CARDIOVASCULAR: Regular rate and rhythm. Normal S1, S2 without murmur, gallop, or rub. PMI is normal size and placement. Carotid upstroke normal without bruit. Jugular venous pressure normal.   LUNGS: Lungs have few basilar crackles with normal respirations.   ABDOMEN: Soft, nontender without hepatosplenomegaly or masses. Abdominal aorta is normal size without bruit.   EXTREMITIES: 2+ bilateral pulses in dorsal, pedal, radial, and femoral arteries without lower extremity edema, cyanosis, or ulcers.   NEUROLOGIC: She is oriented to time, place, and person with normal mood and affect.   ASSESSMENT: This is a 70 year old female with history of atrial fibrillation now in normal sinus rhythm with no evidence of significant episodes, hypertension, diabetes, hyperlipidemia, valvular heart disease stable at this time needing further treatment options.   RECOMMENDATIONS:  1. No further cardiac intervention or other diagnostics necessary at this time other than following telemetry for rhythm disturbances. 2. Cardizem as needed for heart rate control if the patient has an episode of atrial fibrillation.  3. No anticoagulation due to significant concerns of chemotherapy and the patient is currently maintaining normal rhythm.  4. Hypertension control with ACE inhibitor as necessary.    5. Ambulate and follow for any further significant symptoms.  6. Would continue chemotherapy without restriction.    ____________________________ Corey Skains, MD bjk:drc D: 02/25/2012 11:38:44  ET T: 02/25/2012 12:36:14 ET JOB#: [id number]  cc: Corey Skains, MD, <Dictator>  Corey Skains MD ELECTRONICALLY SIGNED 03/11/2012 13:51

## 2014-07-25 NOTE — Op Note (Signed)
PATIENT NAME:  Nina Hudson, Nina Hudson MR#:  [id number] DATE OF BIRTH:  [birth year]  DATE OF PROCEDURE:  01/12/2012  PREOPERATIVE DIAGNOSIS: Lymphoma.   POSTOPERATIVE DIAGNOSIS: Lymphoma.   PROCEDURE: Left subclavian vein port placement with fluoroscopy.   SURGEON: Phoebe Perch, M.D.   ASSISTANT: Leron Croak, PA-S  ANESTHESIA:  Monitored anesthesia care with local anesthetic.   INDICATIONS: This is a patient with diagnosed lymphoma who requires port for chemotherapy. Preoperatively we discussed the rationale for surgery, the options of observation, risk of bleeding, infection, recurrence of disease, thrombosis, nonfunction, breakage, pneumothorax, hemopneumothorax, any of which could require further surgery, chest tube placement, thoracotomy, and the risks of exsanguination. This was all reviewed for her and her husband. Her husband had a port in the past as well and understood these possible risks. This was all reviewed for her in the preop holding area in the presence of her husband. They understood and agreed to proceed.   FINDINGS: Multiple attempts at placement in the left subclavian vein ultimately accomplished. Fluoroscopy demonstrated the port was in the superior vena cava. Postoperative chest film was ordered.   DESCRIPTION OF PROCEDURE: The patient was induced to monitored anesthesia care. She had been given IV antibiotics and VTE prophylaxis was in place. She was prepped and draped in a sterile fashion. Marcaine was infiltrated in the skin and subcutaneous tissues around the anterior chest wall. Then multiple attempts were required to place a large-bore needle into the subclavian vein. Once the subclavian vein was cannulated and the Seldinger wire was placed, fluoroscopy demonstrated that the Seldinger wire was indeed in the superior vena cava. An incision was made and a port pocket was developed with blunt and electrocautery dissection. Hemostasis was with electrocautery.   Over the  Seldinger wire was placed an introducer dilator. The wire was removed. The catheter was placed through the peel-away sheath, which was removed, and then fluoroscopy demonstrated that the port was in the superior vena cava. The catheter was trimmed to length. It had been previously flushed. It was attached to port itself and the port was placed into the port pocket, tied in with 3-0 Prolenes, flushed for function and aspirated and flushed again to ensure that it was functional. Heparinized saline was placed.  The patient was taken out of Trendelenburg position. Deep sutures of 3-0 Vicryl were placed followed by 4-0 subcuticular Monocryl. Steri-Strips, Mastisol, and sterile dressings were placed.   The patient tolerated the procedure well. There were no complications. She was taken to the recovery room in stable condition to be discharged in the care of her family following a chest x-ray, which has been ordered.   ____________________________ Jerrol Banana Burt Knack, MD rec:bjt D: 01/12/2012 08:06:53 ET T: 01/12/2012 09:10:12 ET JOB#: [id number]  cc: Jerrol Banana. Burt Knack, MD, <Dictator> Florene Glen MD ELECTRONICALLY SIGNED 01/13/2012 16:12

## 2014-07-25 NOTE — Consult Note (Signed)
Chief Complaint and History:   Referring Physician Dr. Cynda Acres    Chief Complaint uncontrolled diabetes   Allergies:  Sulfa: Hives, Other  Assessment/Plan:   Assessment/Plan 69 yo F with stg IV lymphoma and diabetes has uncontrolled blood sugars. She was seen, examined, and chart was reviewed. Sugars are in the 300+ range. She will start daily steroids next week. Her out-pt metformin 1000 mg bid has been held.  A/ Uncontrolled diabetes Stg IV Lymphoma  P/ -Start Lantus SoloStar pen 35 units qHS -Start NovoLog 12 units tid AC + SSI. -Expect she will go home today. Rxn were written. She was encouraged to monitor her sugars qACHS. -We will arrrange out-pt follow up in a few weeks. She plans to contact me when she is ready.  Full consult to be dictated.   Electronic Signatures: Judi Cong (MD)  (Signed 21-Nov-13 15:03)  Authored: Chief Complaint and History, ALLERGIES, Assessment/Plan   Last Updated: 21-Nov-13 15:03 by Judi Cong (MD)

## 2014-07-25 NOTE — Discharge Summary (Signed)
PATIENT NAME:  Nina Hudson, Nina Hudson MR#:  [id number] DATE OF BIRTH:  [birth year]  DATE OF ADMISSION:  02/22/2012 DATE OF DISCHARGE:  02/26/2012  FINAL DIAGNOSES:  1. Stage IV lymphoma.  2. Diabetes exacerbated by steroids and chemotherapy.  3. Underlying history of hypertension and hyperlipidemia.  4. Anxiety.  5. Episode x1 of vaginal bleeding due to superficial ulcer that healed.   HISTORY AND PHYSICAL: Dictated on admission.   CONSULTANTS: Lavone Orn, MD - Endocrinology.   HOSPITAL COURSE: The patient was admitted the evening of 02/22/2012 and started on IV hydration with alkalinization of the urine. The next day, 02/23/2012, high-dose methotrexate was given intravenously, as in the admission history and physical note, a dose of 4330 mg of methotrexate with a three-hour infusion, and throughout hospitalization IV fluid volume at 200 mL/hour was maintained with alkalization of the urine with bicarbonate solution, urine pH was continued to be alkalinized and monitored by pH every 2 to 3 hours throughout. Sugars were followed and were uncontrolled so that endocrinology was consulted and sliding scale insulin was adjusted and a dose of Lantus was recommended and added, in the evening, at the time of discharge. The patient had some tickling cough and congestion that improved during the hospitalization. The patient had an episode of blood that was seen on straining, was initially unclear if it was of GI or GU origin, that was noted later confirmed to be GU in origin. There was never any rectal or gastrointestinal bleeding. The 24/48/72 hour methotrexate levels were also monitored, as per protocol. They were unremarkable, although levels indicated potential for toxicity so that has per guidelines leucovorin doses were increased given at 100 mg every dose at six hours x24 hours until the day three level was confirmed at 0.05 micromoles at which time urine alkalization was stopped and leucovorin antidote was  stopped and the patient was discharged home in stable condition.   DISCHARGE MEDICATIONS:  1. Cardizem CD 120 mg 1 tablet twice a day, note that discharge instructions indicated once a day. This was corrected and the patient correctly took her usual twice a day dose.  2. Magnesium oxide 400 mg 1 tablet twice a day. 3. Potassium 10 mEq 1 tablet daily. 4. Xanax 0.5 mg 1 tablet once to twice a day p.r.n. 5. Sliding scale insulin with NovoLog 12 units three times daily before meals plus additional sliding scale for high sugars and dose of Lantus insulin to start every night at 35 units in the evening.  6. Metformin was discontinued.  DISCHARGE DIET: She was sent home on a carbohydrate-controlled ADA diet.   DISCHARGE ACTIVITY: Activities are limited.   DISCHARGE FOLLOWUP: She has an appointment in the [facility] on Monday, 03/01/2012, plus plan for endocrinology follow-up within 2 to 4 weeks. ____________________________ Simonne Come Inez Pilgrim, MD rgg:slb D: 03/11/2012 00:10:48 ET T: 03/11/2012 07:56:05 ET JOB#: [id number]  cc: Simonne Come. Inez Pilgrim, MD, <Dictator> Dallas Schimke MD ELECTRONICALLY SIGNED 03/23/2012 22:01

## 2014-07-28 NOTE — Consult Note (Signed)
PATIENT NAME:  Nina Hudson, Nina Hudson MR#:  [id number] DATE OF BIRTH:  12-26-1945  DATE OF CONSULTATION:    REFERRING PHYSICIAN:   CONSULTING PHYSICIAN:  Corey Skains, MD  PRIMARY CARE PHYSICIAN:  Dr. Inez Pilgrim.  REASON FOR CONSULTATION:  Atrial fibrillation and possible congestive heart failure.   CHIEF COMPLAINT:  "I'm short of breath."   HISTORY OF PRESENT ILLNESS:  This is a 69 year old female with significant cancer requiring cancer treatment of which the patient has had multiple cancer treatments this hospitalization.  The patient has had a history of atrial fibrillation with rapid ventricular rate, but has been on Cardizem and other medication management and has had intermittent episodes during this hospitalization, but currently is in normal sinus rhythm.  The patient has had previous echocardiogram showing some mild valvular heart disease, but no evidence of LV dysfunction requiring further intervention.  The patient has had some episodes where she has had severe edema globally with some resolution at the end of the day with intravenous Lasix.  The patient has had reasonable urine output.  The patient has not had any significant congestive heart failure type symptoms at this time and is diuresing fairly well.  There is no distress at this time consistent with angina.    REVIEW OF SYSTEMS:  The remainder review of systems is negative for vision change, ringing in the ears, hearing loss, cough, congestion, heartburn, nausea, vomiting, diarrhea, bloody stools, stomach pain, extremity pain, leg weakness, cramping of the buttocks, known blood clots, headaches, blackouts, dizzy spells, nosebleeds, congestion, trouble swallowing, frequent urination, urination at night, muscle weakness, numbness, anxiety, depression, skin lesions or skin rashes.   PAST MEDICAL HISTORY: 1.  Cancer.  2.  Valvular heart disease.  3.  Atrial fibrillation.   FAMILY HISTORY:  No family members with early onset of  cardiovascular disease or hypertension.   SOCIAL HISTORY:  She currently denies alcohol or tobacco use.   ALLERGIES:  As listed.   MEDICATIONS:  As listed.   PHYSICAL EXAMINATION: VITAL SIGNS:  Blood pressure is 100/60 bilaterally, heart rate is 80 upright, reclining, and regular.  GENERAL:  She is a well-appearing female in no acute distress.  HEAD, EYES, EARS, NOSE, THROAT:  No icterus, thyromegaly, ulcers, hemorrhage or xanthelasma.  CARDIOVASCULAR:  Slightly irregular with normal S1 and S2 with 2/6 apical murmur consistent with mitral regurgitation.  PMI is diffuse.  Carotid upstroke normal without bruit.  Jugular venous pressure is normal.  LUNGS:  Have a few basilar crackles with normal respirations.  ABDOMEN:  Soft and nontender with some abdominal bloating.  EXTREMITIES:  Shows 2+ radial, femoral, trace dorsal pedal pulses with some 2+ pitting edema in the lower extremity and trace edema in the upper extremities.  NEUROLOGIC:  She is oriented to time, place and person with normal mood and affect.   ASSESSMENT:  A 69 year old female with cancer with cancer treatment appropriate at the time having acute onset of edema, not consistent with true congestive heart failure, more consistent with infusions and other cancer treatments.  The patient may have some mild renal insufficiency or other effects which may require adjustments of dosages.   RECOMMENDATIONS: 1.  No further cardiac intervention at this time due to no apparent significant true congestive heart failure despite edema.  2.  Continue Cardizem for atrial fibrillation and maintenance of normal sinus rhythm.  3.  No anticoagulation due to maintenance of normal rhythm and low platelets.  4.  No reason for further intervention and/or  cardiac diagnostics due to recent echocardiogram showing normal LV systolic function.  5.  Continue infusion as before with Lasix as needed and follow for improvement, watching for renal insufficiency  and true pulmonary edema.     ____________________________ Corey Skains, MD bjk:ea D: 05/26/2012 20:24:26 ET T: 05/26/2012 23:02:21 ET JOB#: [id number]  cc: Corey Skains, MD, <Dictator> Corey Skains MD ELECTRONICALLY SIGNED 06/04/2012 8:54

## 2014-07-28 NOTE — Consult Note (Signed)
PATIENT NAME:  Nina Hudson, Nina Hudson MR#:  [id number] DATE OF BIRTH:  03-03-1946  DATE OF CONSULTATION:  05/11/2012  REFERRING PHYSICIAN:  Barbette Reichmann, MD  CONSULTING PHYSICIAN:  Jamorion Gomillion Lilian Kapur, MD  REASON FOR CONSULTATION: Prevention of renal toxicity with high-dose methotrexate.   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 Dr. Inez Pilgrim requested consultation late last evening. The patient has received several courses of high-dose methotrexate treatment of her underlying large B-cell lymphoma.  There were some concerns that urine alkalization was not working. The patient was started on sodium bicarbonate infusion. There were 2 occasions when urine pH was as low as 5.0. We advised the sodium bicarbonate solution to be changed to sterile water with 3 amps of sodium bicarbonate to be given at 160 mL/h. Serum bicarbonate is acceptably raised to 35. Most recent urine pH was 6, however, the 3 measurements prior to this were at 7.0. The patient tolerated the high-dose methotrexate relatively well. She is not having any nausea at this point in time. Hospitalists are also following on this case.   PAST MEDICAL HISTORY: 1. 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 Inez Pilgrim.  2. Hypertension.  3. Diabetes mellitus.  4. Hyperlipidemia.  5. Anxiety.  6. Chronic atrial fibrillation.  7. Recent herpes zoster skin infection.   PAST SURGICAL HISTORY: Lymph node dissection August 2013   ALLERGIES: Sulfa.   CURRENT INPATIENT MEDICATIONS:  1. Sterile water with 150 mEq of sodium bicarbonate administered at 160 mL/h.  2. Norco 10/325,  1 tablet p.o. every 6 hours p.r.n.  3. Ethyl chloride spray.  4. Advair 250/50, 1 puff inhaled b.i.d.  5. Sliding scale insulin.  6. Lantus insulin 10 units subcutaneous at bedtime.  7. Diltiazem 120 mg daily.  8. Diltiazem 120 mg at bedtime.  9. Famciclovir 250 mg p.o. every 12 hours.  10. Neurontin 300 mg p.o. t.i.d.  11. Magnesium oxide 400 mg p.o. b.i.d.  12. Potassium chloride 10 mEq p.o. b.i.d.  13. Azithromycin 500 mg IV every 24 hours.  14. Ceftriaxone 1 gram IV every 24 hours.  15. Leucovorin 200 mg IV every 6 hours.   SOCIAL HISTORY: The patient lives in [place]. She is married. She works as a Secretary/administrator. She has history of tobacco abuse, approximately 30 pack-years. She drinks alcohol occasionally. Denies illicit drug use.   FAMILY HISTORY: The patient's mother died secondary to pneumonia. She also has a cousin who had colon cancer.   REVIEW OF SYSTEMS:  CONSTITUTIONAL: Denies fevers, chills or weight loss.  EYES: Denies diplopia, blurry vision.  HEENT: Denies headaches, hearing loss. Denies epistaxis.  CARDIOVASCULAR: Denies chest pain, palpitations, PND, orthopnea.  RESPIRATORY: Denies cough, shortness of breath or hemoptysis.  GASTROINTESTINAL: Has had some mild nausea but no vomiting at present.  GENITOURINARY: Denies frequency, urgency or dysuria.  MUSCULOSKELETAL: Denies joint pain, swelling or redness.  INTEGUMENTARY: Denies skin rashes. She had recent herpes zoster infection overlying her left flank.  NEUROLOGICAL: Denies focal extremity numbness, weakness, or tingling.  PSYCHIATRIC: Denies depression, bipolar disorder.  ENDOCRINE: Denies polyuria, polydipsia, or polyphagia. Does have history of diabetes mellitus.  HEMATOLOGIC/LYMPHATIC: Has large B-cell lymphoma being treated with high-dose methotrexate.  ALLERGY/IMMUNOLOGIC:  Denies seasonal allergies or history of immunodeficiency.  PHYSICAL EXAMINATION:  VITAL SIGNS: Temperature 98.1, pulse 70, respirations  21, blood pressure 108/72, pulse oximetry 96% on 3 liters.  GENERAL: A well-developed, well-nourished Caucasian female who appears her stated age, currently in no acute distress.   HEENT: Normocephalic, atraumatic.  Extraocular movements are intact. Pupils are equal, round, reactive to light. No scleral icterus. Conjunctivae are pink.  No epistaxis noted.  Gross hearing intact.  Oral mucosa is moist.  NECK:  Supple, no JVD noted at this time.   LUNGS:  Clear to auscultation bilaterally with normal respiratory effort.  CARDIOVASCULAR:  S1, S2.  Regular rate and rhythm.  No murmurs, rubs or gallops appreciated.   ABDOMEN:  Soft, nontender, nondistended. Bowel sounds are present.  No rebound or guarding. No gross organomegaly appreciated.  EXTREMITIES: No clubbing, cyanosis or edema.  NEUROLOGIC: The patient is alert and oriented to time, person, and place. Strength is 5 out of 5 in both upper and lower extremities.  SKIN: Warm and dry. There are healing shingles lesions overlying the left flank.  MUSCULOSKELETAL: No joint redness, swelling or tenderness appreciated.  PSYCHIATRIC: The patient is with appropriate affect and appears to have good insight into her current illness.   LABORATORY DATA: Sodium 138, potassium 3.6, chloride 97, CO2 35, BUN 11, creatinine 1.1, glucose 323. CBC shows WBC 5.6, hemoglobin 7.9, hematocrit 24, platelets 127.  Most recent urine pH at 13:29 was 6.0. However, the 3 preceding urine pHs were at 7.0.  Methotrexate level is currently pending.   IMPRESSION: This is a 69 year old Caucasian female with past medical history of stage IV large B-cell lymphoma treated with R-CHOP and high-dose methotrexate, hypertension, diabetes mellitus, chemotherapy-induced anemia, who presented for ongoing maintenance chemotherapy with high-dose methotrexate.   PROBLEM LIST: 1. Acute renal failure with creatinine trending up to 1.2. Creatinine 0.53 upon admission.  2. Large B-cell lymphoma,  stage IV, treated with R-CHOP and high-dose methotrexate.  3. Anemia, not otherwise specified, most likely secondary to chemotherapy and underlying lymphoma. 4. Diabetes mellitus.  5. Recent herpes zoster infection.  PLAN: The patient does appear to be having some mild renal toxicity in response to the high-dose methotrexate. On 05/10/2012, creatinine was 0.41.  Creatinine has now trended up to 1.1 with an eGFR of 52. We are currently awaiting methotrexate level. The patient has appropriately been administered agents to alkalize her urine.  For the most part, urine pH was above 7.0; however, there was a period of time where urine pH was as low as 5.0.  Urine pH currently is 6.0. For now, we recommend continued alkalization of the urine with sodium bicarbonate at 150 mEq to be administered at 160 mL/h. We will monitor methotrexate levels along with Dr. Inez Pilgrim. No urgent indication for dialysis at this time; however, if methotrexate levels are found to be significantly high at 24, 48, and 72 hours, may need to be considered. The patient has been administered leucovorin as well. We would avoid agents that would cause dehydration, in particular diuretic therapy at present. We would also avoid any additional nephrotoxins such as NSAIDs during this period of time. Continue to monitor urine pH hourly as you are doing. In addition, as the patient has been administered high-dose methotrexate, I would certainly follow CBC closely. Further plan to be determined given the patient's course.     ____________________________ Tama High, MD mnl:cb D: 05/11/2012 14:44:58 ET T: 05/11/2012 15:07:26 ET JOB#: [id number]  cc:  Jacolby Risby Lilian Kapur, MD, <Dictator> Tama High MD ELECTRONICALLY SIGNED 05/16/2012 20:32

## 2014-07-28 NOTE — Discharge Summary (Signed)
PATIENT NAME:  Nina Hudson, Nina Hudson MR#:  [id number] DATE OF BIRTH:  26-Jun-1945  DATE OF ADMISSION:  05/09/2012 DATE OF DISCHARGE:  05/28/2012  FINAL DIAGNOSES: 1.  Underlying diffuse large-cell lymphoma.  2.  Atrial fibrillation, variable rate, later controlled and converted to sinus.  3.  Diabetes, insulin-dependent.  4.  Azotemia with elevated creatinine secondary to the effects of methotrexate which improved with time during the hospitalization.  5.  Chronic obstructive pulmonary disease, prior smoker, oxygen desaturation, some clinical evidence of chronic obstructive pulmonary disease, some slightly increased right heart pressures, evaluated by pulmonary medicine during hospitalization. 6.  Congestive heart failure, fluid retention, anasarca, no clear evidence of left ventricular dysfunction. Fluid retention, anasarca, hypoalbuminemia and azotemia improved but with persistent edema and fluid retention at the time of discharge.  7.  Recent shingles and persistent shingles pain that was well controlled.  8.  Transient conjunctivitis that resolved with treatment.  9.  Toxic methotrexate levels, monitored and improved at the time of discharge.   HISTORY AND HOSPITAL COURSE: As dictated on February 3rd. The patient was admitted on February 2nd, in the evening, for prehydration. As per prior orders labs were checked, fluid was started with bicarbonate drip at 200 mL an hour, in preparation for alkalizing the urine and to give high-dose methotrexate the following day. Then seen on February 3rd.  As noted on that day and on subsequent notes, urine was alkalized, labs were stable, the patient was clinically stable, methotrexate was started as per usual, and then urine pH dropped and alkalization and bicarbonate support including p.r.n. bicarbonate boluses were initiated immediately. Surveillance was changed so that the urine pH was checked every 2 and then every 1 hour. Hydration status was monitored closely  on a daily basis. Methotrexate levels were followed appropriately and right away the 24-hour levels were elevated and in anticipation of that the prior day leucovorin rescue dose and interval was increased so that the patient received appropriate high dose rescue right away even before confirmation that the levels were high. On a daily basis fluid was monitored, urine pH was monitored, the intravenous bicarbonate replacement was titrated, electrolytes were monitored, and CBC was monitored along with clinical status. The patient had a prolonged and difficult course. She had a bump in the creatinine due to methotrexate that improved, but did not completely come back to baseline at the time of discharge. The patient had fluid retention and then anasarca as a result of azotemia and hypoalbuminemia and vigorous fluid that was required plus diuretics were specifically withheld to avoid dehydration of the renal tubules. Hypoxia was treated with oxygen. There was evidence of probable pneumonia versus infiltrate and atelectasis without any definite evidence of pneumonia or definite organism the patient was treated for pneumonia. Pulmonary was consulted. Hospitalists were consulted. Cardiology was consulted. Heart rate waxed and wane with atrial fibrillation. Later medications were adjusted including the addition of digoxin and metoprolol and then ultimately the patient converted to sinus rhythm. Blood pressure was maintained. Electrolytes were monitored and replaced appropriately. Sugar was labile and diabetes insulin was adjusted with sliding scale. Methotrexate levels continued to be monitored. They only improved very, very slowly due to anasarca and third spacing. Norco was maintained for some shingles pain that was under control. The patient was mobilized as much as possible. The patient did experience and was monitored with platelet nadir to the range of 30,000. The patient also received packed red blood cell  transfusion for symptomatic anemia and hypoxia  with irradiated blood. At one point discharge was considered, but then the patient had hospitalization prolonged due to reluctance and deemed unsafe to go home with continued fluid retention and edema, the need to monitor and titrate urine pH, general weakness and oxygen dependence. Ultimately, the patient's physical status improved. She mobilized a lot of fluid. Low-dose Lasix was used later when she had passed the danger point in terms of methotrexate levels. Leucovorin rescue was continued up to the time of discharge and was planned and given p.o. after discharge along with oral bicarbonate. Lasix was restarted at a low dose. Cardiac medications were maintained. The patient was elevating the legs when not mobilized, but was otherwise ambulating. The patient was fitted for and shown to require continuous oxygen for home use as she would desaturate on any exertions even at rest on room air. Anasarca with significantly improved, but the patient still had lower extremity edema and some fluid retention in the hands with some brawny changes of the lower extremities. Urine output was good and the Foley catheter that had been maintained for about 2 weeks had been discontinued prior to discharge.   At the time of and on the day of discharge, the patient was alert and cooperative, heart rate was regular, blood pressure was controlled, glucose was controlled on sliding scale, the patient had increasingly mobilized but was still weak and required physical therapy, required continuous oxygen, had continued dependent edema but was mobilizing the anasarca, the hemoglobin on February 18th was down to 7.6. The hemoglobin on the day of discharge, February 21st, was 8.2. The platelets were 220. Neutrophils were 2.8. The creatinine had improved back down to the patient's baseline finally at 0.69. The CO2 was still slightly elevated at 34.  The liver functions were normal. The patient  was still symptomatic of anemia and hemoglobin of 8.2 with continued hypoxia and edema, still very weak, although made some progress so decision was made that the patient would also receive a unit of blood, irradiated, prior to discharge. Blood was tolerated, good urine output, Lasix was given and finally early evening, late in the afternoon, the patient was discharged.   At the time of discharge, the patient was on home oxygen 2 liters continuous. She was going to go home with physical therapy and home care nursing assessment. She went home on the following medications:  Magnesium oxide 400 mg 1 tablet twice a day, Cardizem CD 120 mg once at noon and once in the morning, NovoLog sliding scale 3 times a day before meals, lactulose 30 mL p.r.n. for constipation, Lantus insulin 4 mg to 8 mg every evening, the patient was titrating Neurontin 300 mg twice a day, Famvir 250 mg twice a day, alprazolam 0.5 mg 3 times a day, potassium 10 milliequivalents twice a day, digoxin 0.125 mg once a day, metoprolol 25 mg twice a day, acetaminophen 325 mg and hydrocodone 10 mg tablets 1 every 4 hours or 4 times a day p.r.n. for pain, albuterol inhaler 1 puff 4 times a day, Advair 250/50 inhaler 1 puff twice a day, and Lasix 20 mg 1 tab daily or every other day p.r.n. for edema. She was to maintain at least 32 ounces of fluid every 8 hours when awake and physical therapy was to visit. No exertional activity or heavy lifting. Leucovorin 5 mg tablets, 10 tablets of 50 mg exactly every 6 hours beginning at midnight the night of discharge, sodium bicarbonate 650 mg tablets 2 tablets or 1300 mg  p.o. t.i.d. beginning also in the evening. Our plan was to see me on February 24th and to call for any new symptoms.  ____________________________ Simonne Come Inez Pilgrim, MD rgg:sb D: 06/16/2012 12:53:35 ET T: 06/16/2012 13:28:43 ET JOB#: [id number]  cc: Simonne Come. Inez Pilgrim, MD, <Dictator> Dallas Schimke MD ELECTRONICALLY SIGNED 07/11/2012 10:37

## 2014-07-28 NOTE — Consult Note (Signed)
PATIENT NAME:  Nina Hudson, Nina Hudson MR#:  [id number] DATE OF BIRTH:  [birth year]  DATE OF CONSULTATION:  05/11/2012  ATTENDING PHYSICIAN:     Barbette Reichmann, MD CONSULTING PHYSICIAN:  Nicholes Mango, MD  REASON FOR MEDICAL CONSULTATION: Hypoxia.   HISTORY OF PRESENT ILLNESS: The patient is a 68 year old, pleasant Caucasian female with a past medical history of chronic atrial fibrillation, diabetes mellitus on Lantus insulin and a recent diagnosis of CNS lymphoma who was admitted to Dr. Marylene Land service for pre-dehydration before administering high-dose methotrexate. She was started on sodium bicarbonate drip to make urine pH alkaline and to make the urine pH greater than or equal to 7, promoting excretion of methotrexate from the system. At the time of admission the patient's pulse ox was borderline at 90% to 91% on room air. Tonight the nurse was concerned that the patient's pulse ox was dropping down whenever the patient is sitting up to 89% to 90%. She was placed on 3 liters of oxygen following which her pulse ox went up to 93% while resting.  Whenever the patient is sitting up, it is dropping down to 90% to 91%. The hospitalist team is consulted regarding with hypoxia.   During my examination, the patient is resting comfortably but denies any shortness of breath or tightness in her chest. The patient has admitted that she has extensive smoking history and started smoking at age 77 and quit at age 44. She admitted to approximately 30 to 35 pack-years of smoking history. She denies any chest pain, tightness or shortness of breath, but admits to intermittent episodes of cough. Stat chest x-ray was ordered which has revealed early infiltrate on the right side of the chest with minimal pulmonary edema. The patient denies any abdominal pain, nausea, vomiting or diarrhea. Denies any other complaints and is resting comfortably.   PAST MEDICAL HISTORY: Chronic history of atrial fibrillation, stage IV lymphoma,  diabetes exacerbated by steroids and chemotherapy, hypertension, hyperlipidemia and anxiety,  Recently she had history of shingles and she is on Famvir.  PAST SURGICAL HISTORY: Lymph node dissection in August 2013.   ALLERGIES: SHE IS ALLERGIC TO SULFA.  HOME MEDICATIONS:  NovoLog  sliding scale. Norco, 10/325 q.6 hours. Neurontin 300 mg 2 times a day. Magnesium oxide 100 mg twice a day, Lantus insulin 12 units subcutaneous at bedtime.  Lactulose 30 mL orally.  Famvir 250 mg 1 tablet twice a day. Cardizem 120 mg 1 tablet once a day.  Alprazolam 0.5 mg 1 tablet p.o. 3 times a day.    PSYCHOSOCIAL HISTORY: Smoked for approximately 30 years and has 30 pack-years of smoking history. Denies alcohol or illicit drug usage.   FAMILY HISTORY: The patient's cousin has colon cancer.   REVIEW OF SYSTEMS:  CONSTITUTIONAL: Denies any fatigue or weakness. Denies any weight gain or weight loss.  HEENT: Denies any blurry vision, nasal discharge, postnasal drip. No ear pain. Denies any difficulty in swallowing.  RESPIRATORY:  Denies any shortness of breath or chest tightness. Denies any history of asthma.  Complaining of intermittent episodes of cough. CARDIAC:  Denies any chest pain, palpitations, dizziness.  GASTROINTESTINAL: Denies nausea, vomiting, hematemesis, melena, diarrhea.  NEUROLOGIC: Denies any weakness, dysarthria, ataxia.  SKIN: Denies any rash, lesions or acne.  MUSCULOSKELETAL: Denies any joint pain, redness or swelling. Denies any gout. ENDOCRINE:  Denies any polyuria, polyphagia, polydipsia, thyroid problems. HEMATOLOGIC/LYMPHATIC: Denies any bleeding or bruising recently.  PSYCHIATRIC: Denies any anxiety, depression, OCD, bipolar disorder.   PHYSICAL EXAMINATION:  VITAL  SIGNS: Temperature 98.1, pulse 85 to 93, respiratory rate 20, blood pressure 117/56, pulse ox 93%  on 3 liters at rest and 90% to 92% and minimal exertion.  GENERAL APPEARANCE: Not in acute distress. Answering questions  appropriately, moderately built and moderately nourished.  HEENT: Normocephalic, atraumatic. Pupils are equally reacting to light and accommodation. No conjunctival injection. No sinus tenderness. No postnasal drip. No nasal congestion.  NECK: Supple. No JVD. No thyromegaly.  LUNGS: Clear to auscultation bilaterally except in the bases. No crackles. No wheezing. Decreased breath sounds at the bases.  CARDIAC:  S1, S2. Regular rate and rhythm.  GASTROINTESTINAL: Soft. Bowel sounds are positive in all 4 quadrants. Nontender, nondistended. No masses felt.  NEUROLOGIC: Awake, and oriented x 3. Motor and sensory are grossly intact.   SKIN: No acne. No rashes normal turgor. Warm to touch.  EXTREMITIES: Trace edema is present but no cyanosis. No clubbing.  MUSCULOSKELETAL: No joint effusion. No erythema no tenderness noticed.   LABORATORY AND DIAGNOSTIC DATA:  Chest x-ray has revealed early developing infiltrate on right lower lobe and the CP angles are obliterated. a little cephalization was noticed and minimal pulmonary edema at the bases. The patient's glucose is 260, BUN 10, creatinine 0.94, sodium 143, potassium 3.8, chloride 102, CO2 36, GFR greater than 60, serum osmolality 299, calcium 8.1.  Last urine pH at 1:29 a.m. is 5.0.  Recent echocardiogram, which was done 4 days ago, has revealed a normal ejection fraction which is greater than 55%.  The left atrium is mildly dilated.  Left ventricular systolic function is normal.   ASSESSMENT AND PLAN:  The patient is a 69 year old female admitted to Dr. Marylene Land service for pre-hydration prior to being given high dose methotrexate which was frequently given for central nervous system lymphoma and is on sodium bicarbonate drip for promoting excretion of methotrexate.  She has become hypoxemic and hospitalist team is consulted regarding that.   PLAN:   1.  Hypoxemia with minimal exertion, probably from early pneumonia and mild pulmonary edema. The plan is  to give her Rocephin and Azithromycin intravenous, DuoNeb treatments q.6 hours while she is awake, and albuterol nebulizer treatments as needed for shortness of breath.  Holding Lasix for now as it is nephrotoxic as recommended by nephrology and will follow up with nephrology in a.m.  All the medications which was started tonight like Rocephin, Zithromax, DuoNebs and albuterol were checked for drug interactions with high-dose methotrexate and also the pharmacist has checked for safety to administer.  On this medication, it was safe to administer as reported by the pharmacist, Mr. Corene Cornea who is on call. Though Zosy and Levaquin were more appropriate abx they were not considered given the drug interactions and nephrotoxicity. 2.  Central nervous system lymphoma. The patient has requested high-dose methotrexate.  The plan is to alkalinize her urine to a pH of greater than or equal to 7 to facilitate methotrexate excretion. Sodium bicarbonate drip to be continued per Dr. Inez Pilgrim.  We will check a.m. labs. Continue his urine pH monitoring at least 1 hour. 3.  Chronic history of atrial fibrillation, rate controlled. We will continue Diltiazem. 4.  Diabetes mellitus, insulin-requiring.  We will continue sliding scale and Lantus insulin.  The plan is to avoid non-steroidal anti-inflammatory medications, proton pump inhibitors and multivitamins and they have potential drug interactions with high-dose methotrexate. The diagnosis and plan of care was discussed with the patient. She is aware of the plan.   Thank you, Dr. Inez Pilgrim,  for allowing the hospitalist team to take care of this patient.  Total time spent is 60 minutes.   The diagnosis and treatment plan was discussed in great detail with Dr. Inez Pilgrim who is agreeable with the current plan.     ____________________________ Nicholes Mango, MD ag:ct D: 05/11/2012 02:41:44 ET T: 05/11/2012 11:09:19 ET JOB#: [id number]  cc: Nicholes Mango, MD, <Dictator> Simonne Come.  Inez Pilgrim, MD Tama High, MD  Nicholes Mango MD ELECTRONICALLY SIGNED 05/12/2012 4:53

## 2014-08-06 NOTE — Consult Note (Signed)
PATIENT NAME:  Nina Hudson, Nina Hudson MR#:  [id number] DATE OF BIRTH:  1945-11-16  DATE OF CONSULTATION:  04/11/2014  REFERRING PHYSICIAN:  Valentino Nose, MD  CONSULTING PHYSICIAN:  Dwayne D. Emerson, MD  PRIMARY PHYSICIAN:  [facility].    CARDIOLOGIST: Corey Skains, MD   HISTORY OF PRESENT ILLNESS: A 69 year old white female with past history of chronic atrial fibrillation as well as diffuse large cell lymphoma, status post R-CHOP treatment. She has also had stem cell therapy at [facility], presented with whitish sputum with chills. Denies any fever. She also complained of fatigue, weakness; denies any pain, edema, orthopnea, or palpitations, she was sent to the hospital for evaluation,  to the Emergency Department, patient had atrial fibrillation with left ventricular response rates of about 135; received normal saline for dehydration, placed on Cardizem which transiently helped but then then she began to feel poorly   REVIEW OF SYSTEMS: No blackout spells or syncope. No nausea or vomiting. No fever. She has had chills, sweats. No weight loss, weight gain. No hemoptysis or hematemesis. No bright red blood ,  change or hearing,  , cough.    PAST MEDICAL HISTORY: Diffuse large cell lymphoma, status post R-CHOP treatment in remission; type 2 diabetes, hyperlipidemia, atrial fibrillation.   SOCIAL HISTORY: Remote tobacco abuse. No drug use. No significant alcohol consumption.   FAMILY HISTORY: Coronary artery disease.   ALLERGIES: SULFA.   HOME MEDICATIONS: Aspirin 81 mg a day, Lantus, NovoLog sliding scale, Valtrex 1 gram daily, alprazolam 0.5 mg twice a day, metoprolol 25 mg 2 times a day, magnesium oxide 400 mg twice a day, potassium.   PHYSICAL EXAMINATION: VITAL SIGNS: Blood pressure 90/70, heart rate initially 133, respiratory rate 24.  HEENT: Normocephalic, atraumatic. Pupils equal and reactive to light.  NECK: Supple. No JVD, bruit, adenopathy.  LUNGS: Decreased breath sounds, dullness.  HEART:  Tachycardic. Systolic ejection murmur left sternal border.  ABDOMEN: Benign.  EXTREMITIES: Within normal limits.  NEUROLOGIC: Intact.  SKIN: Normal.   DIAGNOSTIC DATA:  EKG, atrial fibrillation, rate of 130, nonspecific ST-T wave changes; chest x-ray right lower lobe pneumonia, COPD, sodium 136, potassium 4.6, chloride of 101, bicarbonate 26, BUN 29, creatinine 1.2, glucose of 208, troponin less than 0.02, white count 17.6, hemoglobin 11.6, platelet count 158,000, urinalysis was negative.   ASSESSMENT: Rapid atrial fibrillation, possible sepsis, pneumonia, diabetes, hyperlipidemia, history of diffuse B-cell lymphoma, history of stem cell transplant, shortness of breath.   PLAN:  1.  Agree with admit. Continue to treat for dehydration, weakness, fatigue, possible sepsis. Hydrate the patient. Recommend broad-spectrum antibiotics to treat pneumonia, possible sepsis.  2.  Control heart rate with Cardizem if possible, for rapid atrial fibrillation, consider echocardiogram.  3.  Continue insulin therapy.  4.  Hypertension. Controlled may not be as necessary because of the hypotension now probably related to infection.  5.  Continue hyperlipidemia therapy with statin.  6.  We will continue to follow patient in the unit for aggressive medical therapy and possible infection and pneumonia and I think her rapid atrial fibrillation is secondary to her underlying illness and infection.    ____________________________ Loran Senters Clayborn Bigness, MD ddc:nt D: 04/11/2014 17:53:02 ET T: 04/11/2014 20:36:25 ET JOB#: [id number]  cc: Dwayne D. Clayborn Bigness, MD, <Dictator> Yolonda Kida MD ELECTRONICALLY SIGNED 04/30/2014 8:47

## 2014-08-06 NOTE — H&P (Signed)
PATIENT NAME:  Nina Hudson, Nina Hudson MR#:  [id number] DATE OF BIRTH:  12-14-1945  DATE OF ADMISSION:  04/10/2014  REFERRING PHYSICIAN:  Dr. Delman Kitten.    PRIMARY CARE PHYSICIAN: Dr. Baldemar Lenis.    ONCOLOGY:  Previously followed up with Dr. Inez Pilgrim, now follows with [facility]   CARDIOLOGY: [facility].   CHIEF COMPLAINT: Shortness of breath, cough.    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 Describes a 2 day duration of shortness of breath and cough productive of whitish sputum as well as associated subjective chills. Denies any fever.  Also positive for fatigue, weakness. Denies any chest pain, edema, orthopnea, or further symptomatology. Denies any palpitations.  Presented to the hospital for further workup and evaluation given these persistent symptoms.  Upon arrival to the Emergency Department noted to be in atrial fibrillation with RVR, heart rate in the 130s-140 range. Thus far she has received 1 liter normal saline bolus and did get a dose of Cardizem with some improvement of heart rate which was only transient and then was not continued given her relative hypotension.   REVIEW OF SYSTEMS:   CONSTITUTIONAL: Positive for chills, fatigue, weakness. Denies any fever.  EYES:  Denies blurred vision, double vision, eye pain.  EARS, NOSE, AND THROAT:  No tinnitus, ear pain, hearing loss.  RESPIRATORY: Positive for cough, shortness of breath.  CARDIOVASCULAR: Denies any chest pain, palpitations, edema.  GASTROINTESTINAL: Denies nausea, vomiting, diarrhea, abdominal pain.  GENITOURINARY:  Denies dysuria or hematuria.  ENDOCRINE: Denies nocturia or thyroid problems.  HEMATOLOGIC AND LYMPHATIC: Denies easy bruising or bleeding.  SKIN: Denies rashes or lesions.  MUSCULOSKELETAL: Denies  pain in neck, back, shoulder, knees, hips, or arthritic symptoms. NEUROLOGIC: Denies any paralysis or paresthesias.  PSYCHIATRIC: Denies any anxiety or depressive symptoms.   Otherwise full review of systems performed by me is negative.   PAST MEDICAL HISTORY: Diffuse large B-cell lymphoma status post R-CHOP treatments, currently in remission, type 2 diabetes insulin-requiring, however uncomplicated, hyperlipidemia unspecified, as well as chronic atrial fibrillation not on any anticoagulation.   SOCIAL HISTORY: Positive for remote tobacco use. Denies any alcohol or drug use.   FAMILY HISTORY: Positive for coronary artery disease.   ALLERGIES: SULFA DRUGS.   HOME MEDICATIONS: Include aspirin 81 mg p.o. daily, Lantus unfortunately unknown dose at this time, NovoLog sliding scale with meals, Valtrex 1 gram p.o. daily, alprazolam 0.5 mg p.o. b.i.d. as needed for anxiety, metoprolol tartrate 25 mg p.o. 3 times daily, magnesium oxide 400 mg p.o. b.i.d., potassium 10 mEq p.o. daily.   PHYSICAL EXAMINATION:  VITAL SIGNS: Temperature 98.1, heart rate of 133, respirations 24, blood pressure 90/71, saturating 80% on room air upon arrival, currently saturating 95% on room air. Weight 50.8 kilos, BMI 20.5.  GENERAL: Weak, frail-appearing Caucasian female, currently in minimal distress given respiratory status and heart rate.  HEAD: Normocephalic, atraumatic.  EYES: Pupils equal, round, reactive to light. Extraocular muscles intact. No scleral icterus.  MOUTH: Moist mucous membranes. Dentition intact. No abscess noted.  EARS, NOSE, AND THROAT:  Clear without exudates. No external lesions.  NECK: Supple. No thyromegaly. No nodules. No JVD.  PULMONARY: Diminished breath sounds throughout all lung fields secondary to poor respiratory effort with bibasilar coarse rhonchi, no wheezes noted. She is tachypneic, however no use of accessory muscles.  Poor  respiratory effort as stated above.  CHEST: Nontender to  palpation.  CARDIOVASCULAR: S1, S2. Irregular rate, irregular rhythm, tachycardic. No murmurs, rubs, or gallops. No edema. Pedal pulses 2 + bilaterally.  GASTROINTESTINAL: Soft, nontender, nondistended. No masses. Positive bowel sounds.  No hepatosplenomegaly.   MUSCULOSKELETAL: No swelling, clubbing, or edema. Range of motion full in all extremities.  NEUROLOGIC: Cranial nerves II through XII intact. No gross focal neurological deficits. Sensation intact. Reflexes intact.  SKIN: No ulceration, lesions, rash, cyanosis. Skin warm and dry. Turgor intact.  PSYCHIATRIC: Mood and affect within normal limits. The patient is awake, alert, oriented x 3. Insight and judgment intact.   LABORATORY DATA: EKG performed which reveals atrial fibrillation with rapid response, heart rate in the 130s. Chest x-ray performed which reveals mild right lower lobe pneumonia, changes suggestive of COPD. Remainder of laboratory data, sodium of 136, potassium 4.6, chloride 101, bicarbonate 26, BUN 29, creatinine 1.20, glucose 208. Troponin is less than 0.02. WBC 17.6, hemoglobin of 11.6, platelets of 154,000. Urinalysis negative for evidence of infection.   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   1.  Atrial fibrillation with rapid ventricular response, relatively low blood pressure, is currently in the high 56C systolically.  We will repeat bolus of normal saline, she has received 1 liter thus far.  We will use digoxin load in the meantime. If her systolic blood pressure is greater than 100 we can initiate Cardizem bolus and drip, starting bolus of 10 mg, IV drip starting at 5 mg an hour. If no improvement with digoxin and blood pressure still remains low she may require an amiodarone drip.   2.  Sepsis, meeting septic criteria by  heart rate, respiratory rate, leukocytosis, secondary to community-acquired pneumonia. Panculture including blood and sputum. Antibiotic coverage with ceftriaxone and azithromycin which were already dosed in the Emergency Department. She has received 1 liter normal saline boluses thus far, however we will initiate sepsis protocol as she does meet septic criteria with hypotension to dose IV fluids at 30 mL/kg which is about 1500 for the initial bolus. Provide DuoNeb treatments q. 4 hours, supplemental O2 to keep oxygen saturation greater than 92% as well as steroid, Solu-Medrol 60 mg IV daily given poor air entry, pneumonia, and likely COPD underlying.   3.  Diabetes type 2, uncomplicated, however insulin-requiring. Q. 6 hour Accu-Cheks with insulin sliding scale.  4.  Hyperlipidemia. Continue statin therapy.  5.  venothrombus embolism prophlatic: Heparin subcutaneous.   CODE STATUS: The patient is full code.   CRITICAL CARE TIME SPENT: 45 minutes.      ____________________________ Aaron Mose. Hower, MD dkh:bu D: 04/10/2014 21:10:44 ET T: 04/10/2014 21:26:16 ET JOB#: [id number]  cc: Aaron Mose. Hower, MD, <Dictator> DAVID Woodfin Ganja MD ELECTRONICALLY SIGNED 04/12/2014 0:14

## 2014-08-06 NOTE — Discharge Summary (Signed)
PATIENT NAME:  Nina Hudson, Nina Hudson MR#:  [id number] DATE OF BIRTH:  [birth year]  DATE OF ADMISSION:  04/10/2014 DATE OF DISCHARGE:  04/12/2014  For detailed note, please take a look at the history and physical done on admission, by Dr. Valentino Nose.    DIAGNOSES AT DISCHARGE: Atrial fibrillation with rapid ventricular response, pneumonia, diabetes, hypertension and anxiety.   DIET: The patient is being discharged to a low-sodium, carbohydrate-controlled diet.   ACTIVITY: As tolerated.   FOLLOWUP: Dr. Serafina Royals in the next 1 to 2 weeks. Also followup with Dr. Baldemar Lenis in the next 1 to 2 weeks.   DISCHARGE MEDICATIONS: Aspirin 81 mg daily, potassium 10 mEq daily, Lantus per sliding scale, NovoLog also t.i.d. per sliding scale, Xanax 0.5 mg b.i.d., valacyclovir 1 gram daily, magnesium oxide 400 mg b.i.d., metoprolol tartrate 25 mg t.i.d. as needed for palpitations, sotalol 80 mg b.i.d., prednisone taper starting at 30 mg down to 10 mg over the next 3 days, and Levaquin 5 mg daily x5 days.   [place] COURSE: Dr. Lujean Amel from cardiology.   PERTINENT STUDIES DONE DURING THE HOSPITAL COURSE: A chest x-ray done on admission showing mild right lower lobe pneumonia, chronic obstructive pulmonary disease with chronic bronchitis. A 2-dimensional echocardiogram showing ejection fraction of 70% to 75%, normal global LV systolic function.   HOSPITAL COURSE: This is a 69 year old female who presented to the hospital with shortness of breath and cough and noted to have a pneumonia also noted to be in rapid atrial fibrillation.    1. Atrial fibrillation with rapid ventricular response. This was probably secondary to the respiratory illness from the pneumonia. The patient was initially admitted to the intensive care unit, thought she would need a Cardizem drip, but she did not. She was maintained on just some oral metoprolol, which seemed to have controlled her rate. The patient was  seen by cardiology, they recommended taking the patient off the metoprolol and just using that as needed basis for palpitations and starting her on  an antirrhythmic like sotalol, which is what she was discharged on. She will continue aspirin for anticoagulation for now and further discussion about long-term anticoagulation needs to be done by her cardiologist, Dr. Nehemiah Massed. Her echocardiogram showed normal ejection fraction as mentioned. The patient was clinically asymptomatic with no evidence of palpitations, therefore discharged home on the sotalol and to use metoprolol as needed for heart rates being over 120 and for palpitations.  2. Sepsis. This was a presumed diagnosis given the patient's leukocytosis and tachycardia. Likely source of the sepsis was pneumonia. The patient was treated with IV ceftriaxone and Zithromax. Her blood cultures remained negative. She has been afebrile. She is currently being discharged on oral Levaquin.  3. Pneumonia, this was likely community-acquired pneumonia. The patient was initially treated with ceftriaxone and Zithromax. Currently being discharged on oral Levaquin.  4. Leukocytosis. This was secondary to pneumonia. It has now resolved after IV antibiotic therapy.  5. Hypertension. The patient remained hemodynamically stable. She was maintained on her metoprolol. She will resume that upon discharge.  6. Diabetes. The patient's blood sugars were a little bit elevated due to the steroids she was getting. At present, she will be discharged back on her Lantus and NovoLog per sliding scale.   DISPOSITION: The patient is being discharged home.   TIME SPENT: 40 minutes.   ____________________________ Belia Heman. Verdell Carmine, MD vjs:ST D: 04/12/2014 15:22:20 ET T: 04/12/2014 22:25:51 ET JOB#: [id number]  cc: Belia Heman.  Verdell Carmine, MD, <Dictator> Derinda Late, MD Corey Skains, MD Henreitta Leber MD ELECTRONICALLY SIGNED 05/02/2014 10:38

## 2015-07-18 ENCOUNTER — Other Ambulatory Visit: Payer: Self-pay | Admitting: Orthopedic Surgery

## 2015-07-18 DIAGNOSIS — M85612 Other cyst of bone, left shoulder: Secondary | ICD-10-CM

## 2015-07-19 ENCOUNTER — Other Ambulatory Visit: Payer: Self-pay | Admitting: Orthopedic Surgery

## 2015-07-19 DIAGNOSIS — M85612 Other cyst of bone, left shoulder: Secondary | ICD-10-CM

## 2015-07-26 ENCOUNTER — Other Ambulatory Visit
Admission: RE | Admit: 2015-07-26 | Discharge: 2015-07-26 | Disposition: A | Discharge status: Home / Self Care | Payer: Medicare Other | Source: Ambulatory Visit | Attending: Orthopedic Surgery | Admitting: Orthopedic Surgery

## 2015-07-26 ENCOUNTER — Ambulatory Visit
Admission: RE | Admit: 2015-07-26 | Discharge: 2015-07-26 | Disposition: A | Discharge status: Home / Self Care | Payer: Medicare Other | Source: Ambulatory Visit | Attending: Orthopedic Surgery | Admitting: Orthopedic Surgery

## 2015-07-26 DIAGNOSIS — R938 Abnormal findings on diagnostic imaging of other specified body structures: Secondary | ICD-10-CM | POA: Insufficient documentation

## 2015-07-26 DIAGNOSIS — Z959 Presence of cardiac and vascular implant and graft, unspecified: Secondary | ICD-10-CM | POA: Insufficient documentation

## 2015-07-26 DIAGNOSIS — M85612 Other cyst of bone, left shoulder: Secondary | ICD-10-CM | POA: Insufficient documentation

## 2015-07-26 DIAGNOSIS — M85412 Solitary bone cyst, left shoulder: Secondary | ICD-10-CM | POA: Insufficient documentation

## 2015-07-26 LAB — BUN: BUN: 22 mg/dL — ABNORMAL HIGH (ref 6–20)

## 2015-07-26 LAB — SODIUM: Sodium: 140 mmol/L (ref 135–145)

## 2015-07-26 LAB — CREATININE, SERUM
CREATININE: 0.89 mg/dL (ref 0.44–1.00)
GFR calc Af Amer: >60 mL/min (ref >60)
GFR calc non Af Amer: >60 mL/min (ref >60)

## 2015-07-26 LAB — CHLORIDE: Chloride: 103 mmol/L (ref 101–111)

## 2015-07-26 LAB — POTASSIUM: POTASSIUM: 4 mmol/L (ref 3.5–5.1)

## 2015-07-26 LAB — GLUCOSE, RANDOM: GLUCOSE: 136 mg/dL — AB (ref 65–99)

## 2015-07-26 MED ORDER — GADOBENATE DIMEGLUMINE 529 MG/ML IV SOLN
10.0000 mL | Freq: Once | INTRAVENOUS | Status: AC | PRN
  Administered 2015-07-26: 10 mL via INTRAVENOUS

## 2015-09-12 ENCOUNTER — Encounter
Admission: RE | Admit: 2015-09-12 | Discharge: 2015-09-12 | Disposition: A | Discharge status: Home / Self Care | Payer: Medicare Other | Source: Ambulatory Visit | Attending: Orthopedic Surgery | Admitting: Orthopedic Surgery

## 2015-09-12 HISTORY — DX: Unspecified osteoarthritis, unspecified site: M19.90

## 2015-09-12 HISTORY — DX: Essential (primary) hypertension: I10

## 2015-09-12 HISTORY — DX: Malignant (primary) neoplasm, unspecified: C80.1

## 2015-09-12 HISTORY — DX: Type 2 diabetes mellitus without complications: E11.9

## 2015-09-12 HISTORY — DX: Major depressive disorder, single episode, unspecified: F32.9

## 2015-09-12 HISTORY — DX: Pure hypercholesterolemia, unspecified: E78.00

## 2015-09-12 HISTORY — DX: Depression, unspecified: F32.A

## 2015-09-12 HISTORY — DX: Cardiac arrhythmia, unspecified: I49.9

## 2015-09-12 NOTE — Pre-Procedure Instructions (Signed)
NORMAL SINUS RHYTHM LEFT ANTERIOR FASCICULAR BLOCK INFERIOR INFARCT (CITED ON OR BEFORE 31-Mar-2015) ABNORMAL ECG WHEN COMPARED WITH ECG OF 30-Mar-2015 09:52, SINUS RHYTHM HAS REPLACED ATRIAL FIBRILLATION VENT. RATE HAS DECREASED BY  61 BPM ST NO LONGER DEPRESSED IN ANTEROLATERAL LEADS T WAVE INVERSION NO LONGER EVIDENT IN LATERAL LEADS Confirmed by Pulikkotil, Thelsa [phone]) on 04/01/2015 11:06:33 AM

## 2015-09-12 NOTE — Patient Instructions (Signed)
  Your procedure is scheduled on:Wednesday June 14 , 2017. Report to Same Day Surgery. To find out your arrival time please call [phone] between 1PM - 3PM on Tuesday September 18, 2015.  Remember: Instructions that are not followed completely may result in serious medical risk, up to and including death, or upon the discretion of your surgeon and anesthesiologist your surgery may need to be rescheduled.    _x___ 1. Do not eat food or drink liquids after midnight. No gum chewing or hard candies.     _x___ 2. No Alcohol for 24 hours before or after surgery.   ____ 3. Bring all medications with you on the day of surgery if instructed.    __x__ 4. Notify your doctor if there is any change in your medical condition     (cold, fever, infections).     Do not wear jewelry, make-up, hairpins, clips or nail polish.  Do not wear lotions, powders, or perfumes.   Do not shave 48 hours prior to surgery. Men may shave face and neck.  Do not bring valuables to the hospital.    [facility] is not responsible for any belongings or valuables.               Contacts, dentures or bridgework may not be worn into surgery.  Leave your suitcase in the car. After surgery it may be brought to your room.  For patients admitted to the hospital, discharge time is determined by your treatment team.   Patients discharged the day of surgery will not be allowed to drive home.    Please read over the following fact sheets that you were given:   [facility] Preparing for Surgery  ____ Take these medicines the morning of surgery with A SIP OF WATER: NONE     ____ Fleet Enema (as directed)   ____ Use CHG Soap as directed on instruction sheet  ____ Use inhalers on the day of surgery and bring to hospital day of surgery  _x___ Stop metformin 2 days prior to surgery on September 17, 2015.    ____ Take 1/2 of usual insulin dose the night before surgery and none on the morning of surgery.   _x___ Ask Dr. Marry Guan if pt  should stop aspirin.  _x___ Stop Anti-inflammatories such as Advil, Aleve, Ibuprofen, Motrin, Naproxen, Naprosyn, Goodies powders or aspirin products. OK to take Tylenol.   ____ Stop supplements until after surgery.    ____ Bring C-Pap to the hospital.

## 2015-09-18 ENCOUNTER — Encounter: Payer: Self-pay | Admitting: Anesthesiology

## 2015-09-19 ENCOUNTER — Ambulatory Visit: Admission: RE | Admit: 2015-09-19 | Payer: Medicare Other | Source: Ambulatory Visit | Admitting: Orthopedic Surgery

## 2015-09-19 ENCOUNTER — Encounter: Admission: RE | Payer: Self-pay | Source: Ambulatory Visit

## 2015-09-19 SURGERY — EXCISION, MASS, NECK
Anesthesia: Choice

## 2015-09-28 ENCOUNTER — Ambulatory Visit: Payer: Medicare Other | Admitting: Certified Registered Nurse Anesthetist

## 2015-09-28 ENCOUNTER — Encounter
Admission: RE | Disposition: A | Discharge status: Home / Self Care | Payer: Self-pay | Source: Ambulatory Visit | Attending: Orthopedic Surgery

## 2015-09-28 ENCOUNTER — Ambulatory Visit
Admission: RE | Admit: 2015-09-28 | Discharge: 2015-09-28 | Disposition: A | Discharge status: Home / Self Care | Payer: Medicare Other | Source: Ambulatory Visit | Attending: Orthopedic Surgery | Admitting: Orthopedic Surgery

## 2015-09-28 ENCOUNTER — Encounter: Payer: Self-pay | Admitting: Orthopedic Surgery

## 2015-09-28 DIAGNOSIS — E119 Type 2 diabetes mellitus without complications: Secondary | ICD-10-CM | POA: Insufficient documentation

## 2015-09-28 DIAGNOSIS — L720 Epidermal cyst: Secondary | ICD-10-CM | POA: Diagnosis not present

## 2015-09-28 DIAGNOSIS — Z87891 Personal history of nicotine dependence: Secondary | ICD-10-CM | POA: Insufficient documentation

## 2015-09-28 DIAGNOSIS — F329 Major depressive disorder, single episode, unspecified: Secondary | ICD-10-CM | POA: Diagnosis not present

## 2015-09-28 DIAGNOSIS — I10 Essential (primary) hypertension: Secondary | ICD-10-CM | POA: Insufficient documentation

## 2015-09-28 DIAGNOSIS — M1990 Unspecified osteoarthritis, unspecified site: Secondary | ICD-10-CM | POA: Insufficient documentation

## 2015-09-28 DIAGNOSIS — R222 Localized swelling, mass and lump, trunk: Secondary | ICD-10-CM | POA: Diagnosis present

## 2015-09-28 DIAGNOSIS — I4891 Unspecified atrial fibrillation: Secondary | ICD-10-CM | POA: Insufficient documentation

## 2015-09-28 DIAGNOSIS — Z8572 Personal history of non-Hodgkin lymphomas: Secondary | ICD-10-CM | POA: Insufficient documentation

## 2015-09-28 HISTORY — PX: MASS EXCISION: SHX2000

## 2015-09-28 LAB — GLUCOSE, CAPILLARY
GLUCOSE-CAPILLARY: 131 mg/dL — AB (ref 65–99)
Glucose-Capillary: 122 mg/dL — ABNORMAL HIGH (ref 65–99)

## 2015-09-28 SURGERY — EXCISION MASS
Anesthesia: General | Site: Chest | Wound class: Clean

## 2015-09-28 MED ORDER — BUPIVACAINE HCL (PF) 0.25 % IJ SOLN
INTRAMUSCULAR | Status: DC | PRN
  Administered 2015-09-28: 3 mL

## 2015-09-28 MED ORDER — PROPOFOL 500 MG/50ML IV EMUL
INTRAVENOUS | Status: DC | PRN
  Administered 2015-09-28: 35 ug/kg/min via INTRAVENOUS

## 2015-09-28 MED ORDER — FENTANYL CITRATE (PF) 100 MCG/2ML IJ SOLN
INTRAMUSCULAR | Status: DC | PRN
  Administered 2015-09-28 (×4): 25 ug via INTRAVENOUS

## 2015-09-28 MED ORDER — SODIUM CHLORIDE 0.9 % IV SOLN
INTRAVENOUS | Status: DC
  Administered 2015-09-28: 07:00:00 via INTRAVENOUS

## 2015-09-28 MED ORDER — NEOMYCIN-POLYMYXIN B GU 40-200000 IR SOLN
Status: DC | PRN
  Administered 2015-09-28: 500 mL

## 2015-09-28 MED ORDER — FENTANYL CITRATE (PF) 100 MCG/2ML IJ SOLN: 25.0000 ug | INTRAMUSCULAR | Status: DC | PRN

## 2015-09-28 MED ORDER — BUPIVACAINE HCL (PF) 0.25 % IJ SOLN
INTRAMUSCULAR | Status: AC
Start: 2015-09-28 — End: 2015-09-28
  Filled 2015-09-28: qty 30

## 2015-09-28 MED ORDER — HYDROCODONE-ACETAMINOPHEN 5-325 MG PO TABS: 1.0000 | ORAL_TABLET | ORAL | Status: DC | PRN

## 2015-09-28 MED ORDER — METOPROLOL TARTRATE 5 MG/5ML IV SOLN
INTRAVENOUS | Status: DC | PRN
  Administered 2015-09-28 (×2): 1 mg via INTRAVENOUS

## 2015-09-28 MED ORDER — ONDANSETRON HCL 4 MG/2ML IJ SOLN: 4.0000 mg | Freq: Once | INTRAMUSCULAR | Status: DC | PRN

## 2015-09-28 MED ORDER — FAMOTIDINE 20 MG PO TABS
20.0000 mg | ORAL_TABLET | Freq: Once | ORAL | Status: AC
  Administered 2015-09-28: 20 mg via ORAL

## 2015-09-28 MED ORDER — ACETAMINOPHEN 10 MG/ML IV SOLN
INTRAVENOUS | Status: AC
  Filled 2015-09-28: qty 100

## 2015-09-28 MED ORDER — NEOMYCIN-POLYMYXIN B GU 40-200000 IR SOLN
Status: AC
Start: 2015-09-28 — End: 2015-09-28
  Filled 2015-09-28: qty 2

## 2015-09-28 MED ORDER — FAMOTIDINE 20 MG PO TABS
ORAL_TABLET | ORAL | Status: AC
  Administered 2015-09-28: 20 mg via ORAL
  Filled 2015-09-28: qty 1

## 2015-09-28 MED ORDER — HYDRALAZINE HCL 20 MG/ML IJ SOLN
INTRAMUSCULAR | Status: DC | PRN
  Administered 2015-09-28: 10 mg via INTRAVENOUS

## 2015-09-28 MED ORDER — ACETAMINOPHEN 10 MG/ML IV SOLN
INTRAVENOUS | Status: DC | PRN
  Administered 2015-09-28: 1000 mg via INTRAVENOUS

## 2015-09-28 MED ORDER — CHLORHEXIDINE GLUCONATE 4 % EX LIQD: 60.0000 mL | Freq: Once | CUTANEOUS | Status: DC

## 2015-09-28 SURGICAL SUPPLY — 29 items
CANISTER SUCT 1200ML W/VALVE (MISCELLANEOUS) ×3 IMPLANT
CLOSURE WOUND 1/2 X4 (GAUZE/BANDAGES/DRESSINGS)
CLOSURE WOUND 1/4X4 (GAUZE/BANDAGES/DRESSINGS) ×1
DECANTER SPIKE VIAL GLASS SM (MISCELLANEOUS) ×3 IMPLANT
DRAPE LAPAROTOMY TRNSV 106X77 (MISCELLANEOUS) ×3 IMPLANT
DRSG DERMACEA 8X12 NADH (GAUZE/BANDAGES/DRESSINGS) ×3 IMPLANT
DRSG OPSITE POSTOP 3X4 (GAUZE/BANDAGES/DRESSINGS) ×3 IMPLANT
DURAPREP 26ML APPLICATOR (WOUND CARE) ×3 IMPLANT
ELECT CAUTERY BLADE 6.4 (BLADE) ×3 IMPLANT
GAUZE SPONGE 4X4 12PLY STRL (GAUZE/BANDAGES/DRESSINGS) IMPLANT
GLOVE BIO SURGEON STRL SZ8 (GLOVE) ×3 IMPLANT
GLOVE BIOGEL M STRL SZ7.5 (GLOVE) ×3 IMPLANT
GOWN STRL REUS W/ TWL LRG LVL3 (GOWN DISPOSABLE) ×2 IMPLANT
GOWN STRL REUS W/TWL LRG LVL3 (GOWN DISPOSABLE) ×4
KIT RM TURNOVER STRD PROC AR (KITS) ×3 IMPLANT
NEEDLE HYPO 25X1 1.5 SAFETY (NEEDLE) ×3 IMPLANT
NS IRRIG 500ML POUR BTL (IV SOLUTION) ×3 IMPLANT
PACK BASIN MAJOR ARMC (MISCELLANEOUS) ×3 IMPLANT
PAD GROUND ADULT SPLIT (MISCELLANEOUS) ×3 IMPLANT
SOL PREP PVP 2OZ (MISCELLANEOUS) ×3
SOLUTION PREP PVP 2OZ (MISCELLANEOUS) ×1 IMPLANT
STRIP CLOSURE SKIN 1/2X4 (GAUZE/BANDAGES/DRESSINGS) IMPLANT
STRIP CLOSURE SKIN 1/4X4 (GAUZE/BANDAGES/DRESSINGS) ×2 IMPLANT
SUCTION FRAZIER TIP 10 FR DISP (SUCTIONS) ×3 IMPLANT
SUT VIC AB 2-0 SH 27 (SUTURE)
SUT VIC AB 2-0 SH 27XBRD (SUTURE) IMPLANT
SUT VIC AB 3-0 PS2 18 (SUTURE) IMPLANT
SYR 20CC LL (SYRINGE) ×3 IMPLANT
TAPE MICROPORE 2IN (TAPE) ×3 IMPLANT

## 2015-09-28 NOTE — Brief Op Note (Signed)
09/28/2015  8:56 AM  PATIENT:  Nina Hudson  70 y.o. female  PRE-OPERATIVE DIAGNOSIS:  Cystic masses to the proximal sternum  POST-OPERATIVE DIAGNOSIS:  cystic masses to left sternoclavicular region, probable sebaceous cyst  PROCEDURE:  Procedure(s): EXCISION OF 2 CYSTIC MASSES FROM MEDIAL STERNUM (N/A), pathology pending  SURGEON:  Surgeon(s) and Role:    * Dereck Leep, MD - Primary  ASSISTANTS: none   ANESTHESIA:   local and IV sedation  EBL:  Total I/O In: 500 [I.V.:500] Out: 0   BLOOD ADMINISTERED:none  DRAINS: none   LOCAL MEDICATIONS USED:  MARCAINE     SPECIMEN:  Source of Specimen:  Proximal left sternum  DISPOSITION OF SPECIMEN:  PATHOLOGY  COUNTS:  YES  TOURNIQUET:  * No tourniquets in log *  DICTATION: .Dragon Dictation  PLAN OF CARE: Discharge to home after PACU  PATIENT DISPOSITION:  PACU - hemodynamically stable.   Delay start of Pharmacological VTE agent (>24hrs) due to surgical blood loss or risk of bleeding: not applicable

## 2015-09-28 NOTE — Discharge Instructions (Signed)

## 2015-09-28 NOTE — Anesthesia Procedure Notes (Signed)
Date/Time: 09/28/2015 7:46 AM Performed by: Johnna Acosta Pre-anesthesia Checklist: Patient identified, Emergency Drugs available, Suction available, Patient being monitored and Timeout performed Patient Re-evaluated:Patient Re-evaluated prior to inductionOxygen Delivery Method: Nasal cannula

## 2015-09-28 NOTE — H&P (Signed)
The patient has been re-examined, and the chart reviewed, and there have been no interval changes to the documented history and physical.    The risks, benefits, and alternatives have been discussed at length. The patient expressed understanding of the risks benefits and agreed with plans for surgical intervention.  Eileene Kisling P. Addisyn Leclaire, Jr. M.D.    

## 2015-09-28 NOTE — Transfer of Care (Signed)
Immediate Anesthesia Transfer of Care Note  Patient: Nina Hudson  Procedure(s) Performed: Procedure(s): EXCISION OF 2 CYSTIC MASSES FROM MEDIAL STERNUM (N/A)  Patient Location: PACU  Anesthesia Type:General  Level of Consciousness: awake, alert  and oriented  Airway & Oxygen Therapy: Patient Spontanous Breathing and Patient connected to nasal cannula oxygen  Post-op Assessment: Report given to RN and Post -op Vital signs reviewed and stable  Post vital signs: Reviewed and stable  Last Vitals:  Filed Vitals:   09/28/15 0633  BP: 165/94  Pulse: 74  Temp: 36.4 C  Resp: 20    Last Pain: There were no vitals filed for this visit.       Complications: No apparent anesthesia complications

## 2015-09-28 NOTE — Anesthesia Postprocedure Evaluation (Signed)
Anesthesia Post Note  Patient: Nina Hudson  Procedure(s) Performed: Procedure(s) (LRB): EXCISION OF 2 CYSTIC MASSES FROM MEDIAL STERNUM (N/A)  Patient location during evaluation: PACU Anesthesia Type: General Level of consciousness: awake and alert and oriented Pain management: pain level controlled Vital Signs Assessment: post-procedure vital signs reviewed and stable Respiratory status: spontaneous breathing Cardiovascular status: blood pressure returned to baseline Anesthetic complications: no    Last Vitals:  Filed Vitals:   09/28/15 0946 09/28/15 0954  BP: 143/95 135/70  Pulse: 70 73  Temp: 36.3 C   Resp: 18 18    Last Pain: There were no vitals filed for this visit.               Marte Celani

## 2015-09-28 NOTE — Anesthesia Preprocedure Evaluation (Signed)
Anesthesia Evaluation  Patient identified by MRN, date of birth, ID band Patient awake    Reviewed: Allergy & Precautions, NPO status , Patient's Chart, lab work & pertinent test results, reviewed documented beta blocker date and time   Airway Mallampati: III       Dental  (+) Caps   Pulmonary former smoker,    Pulmonary exam normal        Cardiovascular hypertension, Pt. on medications and Pt. on home beta blockers + dysrhythmias Atrial Fibrillation  Rhythm:Irregular     Neuro/Psych Depression vertigo    GI/Hepatic negative GI ROS, Neg liver ROS,   Endo/Other  diabetes, Well Controlled, Type 2, Oral Hypoglycemic Agents  Renal/GU negative Renal ROS     Musculoskeletal  (+) Arthritis , Osteoarthritis,    Abdominal Normal abdominal exam  (+)   Peds negative pediatric ROS (+)  Hematology negative hematology ROS (+)   Anesthesia Other Findings Afib Lymphoma Hx with stem cell transplant Diabetes vertyigo Mobile masses on sternum  Reproductive/Obstetrics                             Anesthesia Physical Anesthesia Plan  ASA: III  Anesthesia Plan: General   Post-op Pain Management:    Induction: Intravenous  Airway Management Planned: Nasal Cannula  Additional Equipment:   Intra-op Plan:   Post-operative Plan:   Informed Consent: I have reviewed the patients History and Physical, chart, labs and discussed the procedure including the risks, benefits and alternatives for the proposed anesthesia with the patient or authorized representative who has indicated his/her understanding and acceptance.   Dental advisory given  Plan Discussed with: CRNA and Surgeon  Anesthesia Plan Comments:         Anesthesia Quick Evaluation

## 2015-09-28 NOTE — Op Note (Signed)
OPERATIVE NOTE  DATE OF SURGERY:  09/28/2015  PATIENT NAME:  Nina Hudson   DOB: 1946/01/15  MRN: [id number]   PRE-OPERATIVE DIAGNOSIS:  2 cystic masses to the proximal left sternum  POST-OPERATIVE DIAGNOSIS:  Same, pathology pending  PROCEDURE:  Excision of 2 cystic masses from the proximal left sternum  SURGEON:  Marciano Sequin., M.D.   ASSISTANT: None  ANESTHESIA: local and IV sedation  ESTIMATED BLOOD LOSS: Minimal  FLUIDS REPLACED: 500 mL of crystalloid  TOURNIQUET TIME: Not used   DRAINS: None  IMPLANTS UTILIZED: None  INDICATIONS FOR SURGERY: Nina Hudson is a 70 y.o. year old female who has been seen for complaints of 2 cystic lesions to the area of the proximal sternum. Findings were suggestive of inclusion cysts. After discussion of the risks and benefits of surgical intervention, the patient expressed understanding of the risks benefits and agree with plans for excision of the cystic masses from the area of the proximal sternum.Marland Kitchen   PROCEDURE IN DETAIL: The patient was brought into the operating room and, after adequate IV sedation, a timeout was performed. The area of the proximal sternum was cleaned and prepped with alcohol and DuraPrep and draped in the usual sterile fashion. The 2 cystic masses were identified and a total of 3 mL of 0.25% Marcaine was used for local anesthesia. Both lesions were relatively superficial. A transverse incision was made over the larger of the 2 masses, measuring roughly 2 cm in its longest dimension. Scissors were used to dissect around the cystic lesion and the mass was excised in its entirety and submitted to pathology. Sebaceous-appearing material was apparent within the cyst. Next, a transverse incision was made over the smaller of the 2 lesions and dissection was continued around the cyst until the lesion was excised in its entirety. The second mass measured approximately 1 cm in its longest dimension. Again, sebaceous  appearing material could be expressed from the cyst. Both wounds were irrigated with copious amounts of normal saline. Good hemostasis was appreciated. Skin edges were reapproximated using interrupted sutures of #3-0 Vicryl. Steri-Strips were applied followed by application of a sterile dressing.  The patient tolerated the procedure well. She was transported to the recovery room in stable condition.   James P. Holley Bouche M.D.

## 2015-10-01 LAB — SURGICAL PATHOLOGY

## 2016-09-19 ENCOUNTER — Other Ambulatory Visit: Payer: Self-pay | Admitting: Otolaryngology

## 2016-09-19 DIAGNOSIS — K118 Other diseases of salivary glands: Secondary | ICD-10-CM

## 2016-09-24 ENCOUNTER — Ambulatory Visit
Admission: RE | Admit: 2016-09-24 | Discharge: 2016-09-24 | Disposition: A | Discharge status: Home / Self Care | Payer: Medicare Other | Source: Ambulatory Visit | Attending: Otolaryngology | Admitting: Otolaryngology

## 2016-09-24 DIAGNOSIS — R911 Solitary pulmonary nodule: Secondary | ICD-10-CM | POA: Diagnosis not present

## 2016-09-24 DIAGNOSIS — K118 Other diseases of salivary glands: Secondary | ICD-10-CM | POA: Insufficient documentation

## 2016-09-24 LAB — POCT I-STAT CREATININE: Creatinine, Ser: 1.1 mg/dL — ABNORMAL HIGH (ref 0.44–1.00)

## 2016-09-24 MED ORDER — IOPAMIDOL (ISOVUE-300) INJECTION 61%
75.0000 mL | Freq: Once | INTRAVENOUS | Status: AC | PRN
  Administered 2016-09-24: 75 mL via INTRAVENOUS

## 2017-08-09 ENCOUNTER — Emergency Department
Admission: EM | Admit: 2017-08-09 | Discharge: 2017-08-09 | Disposition: A | Discharge status: Home / Self Care | Payer: Medicare Other | Attending: Emergency Medicine | Admitting: Emergency Medicine

## 2017-08-09 ENCOUNTER — Encounter: Payer: Self-pay | Admitting: Emergency Medicine

## 2017-08-09 ENCOUNTER — Other Ambulatory Visit: Payer: Self-pay

## 2017-08-09 DIAGNOSIS — Z87891 Personal history of nicotine dependence: Secondary | ICD-10-CM | POA: Diagnosis not present

## 2017-08-09 DIAGNOSIS — Z7982 Long term (current) use of aspirin: Secondary | ICD-10-CM | POA: Insufficient documentation

## 2017-08-09 DIAGNOSIS — Z8673 Personal history of transient ischemic attack (TIA), and cerebral infarction without residual deficits: Secondary | ICD-10-CM | POA: Diagnosis not present

## 2017-08-09 DIAGNOSIS — Z8572 Personal history of non-Hodgkin lymphomas: Secondary | ICD-10-CM | POA: Insufficient documentation

## 2017-08-09 DIAGNOSIS — E119 Type 2 diabetes mellitus without complications: Secondary | ICD-10-CM | POA: Insufficient documentation

## 2017-08-09 DIAGNOSIS — N764 Abscess of vulva: Secondary | ICD-10-CM

## 2017-08-09 DIAGNOSIS — Z79899 Other long term (current) drug therapy: Secondary | ICD-10-CM | POA: Diagnosis not present

## 2017-08-09 DIAGNOSIS — Z8541 Personal history of malignant neoplasm of cervix uteri: Secondary | ICD-10-CM | POA: Insufficient documentation

## 2017-08-09 DIAGNOSIS — Z7984 Long term (current) use of oral hypoglycemic drugs: Secondary | ICD-10-CM | POA: Diagnosis not present

## 2017-08-09 DIAGNOSIS — L0291 Cutaneous abscess, unspecified: Secondary | ICD-10-CM | POA: Diagnosis present

## 2017-08-09 DIAGNOSIS — I10 Essential (primary) hypertension: Secondary | ICD-10-CM | POA: Insufficient documentation

## 2017-08-09 HISTORY — DX: Cerebral infarction, unspecified: I63.9

## 2017-08-09 MED ORDER — AMOXICILLIN-POT CLAVULANATE 875-125 MG PO TABS: 1.0000 | ORAL_TABLET | Freq: Two times a day (BID) | ORAL | 0 refills | Status: DC

## 2017-08-09 MED ORDER — LIDOCAINE HCL (PF) 1 % IJ SOLN
5.0000 mL | Freq: Once | INTRAMUSCULAR | Status: AC
  Administered 2017-08-09: 5 mL
  Filled 2017-08-09: qty 5

## 2017-08-09 NOTE — ED Triage Notes (Signed)
Pt arrived via POV with reports of left labial abscess. Pt was seen at [facility] but was referred to ED. Pt has hx of abscess on labia but states it has been many years ago.

## 2017-08-09 NOTE — Discharge Instructions (Addendum)
You have been diagnosed with an abscess.  We opened and drained the abscess for you today.  We packed the abscess, the packing needs to be taken out in 24 to 48 hours.  We are providing you with a prescription for Augmentin 2 times a day for 10 days.  Monitor for increased pain, redness, swelling or drainage from the area.

## 2017-08-09 NOTE — ED Provider Notes (Signed)
[facility] Emergency Department Provider Note ____________________________________________  Time seen: 9449  I have reviewed the triage vital signs and the nursing notes.  HISTORY  Chief Complaint  Abscess   HPI Nina Hudson is a 72 y.o. female presents to the ER today with complaint of left labial swelling, pain, and redness.  She reports she noticed this 4 days ago.  [facility] and they sent her over here for further evaluation treatment.  She has tried warm compresses and Tylenol over-the-counter with minimal relief.  Past Medical History:  Diagnosis Date  . Arthritis   . Cancer (HCC)    diffuse large B cell lymphoma  . Cancer ([place])    uterine  . Depression   . Diabetes mellitus without complication ([place])   . Dysrhythmia    A-fib  . Hypercholesteremia   . Hypertension   . Stroke [facility])     There are no active problems to display for this patient.   Past Surgical History:  Procedure Laterality Date  . ABDOMINAL HYSTERECTOMY    . APPENDECTOMY    . BUNIONECTOMY Bilateral   . FRACTURE SURGERY Right    ankle  . MASS EXCISION N/A 09/28/2015   Procedure: EXCISION OF 2 CYSTIC MASSES FROM MEDIAL STERNUM;  Surgeon: Dereck Leep, MD;  Location: [facility] ORS;  Service: Orthopedics;  Laterality: N/A;  . rheumatoid nodual removal    . TUBAL LIGATION      Prior to Admission medications   Medication Sig Start Date End Date Taking? Authorizing Provider  amoxicillin-clavulanate (AUGMENTIN) 875-125 MG tablet Take 1 tablet by mouth 2 (two) times daily. 08/09/17   [name], NP  aspirin 81 MG tablet Take 81 mg by mouth daily.     [provider]  HYDROcodone-acetaminophen (NORCO) 5-325 MG tablet Take 1-2 tablets by mouth every 4 (four) hours as needed for moderate pain. 09/28/15   Hooten, Laurice Record, MD  metFORMIN (GLUCOPHAGE) 1000 MG tablet Take 1,000 mg by mouth 2 (two) times daily with a meal.    [provider]  metoprolol tartrate (LOPRESSOR) 25 MG tablet Take 50-75 mg by mouth 2 (two) times daily. 3 tablets in the am and 2 tablets at night.    [provider]  mirtazapine (REMERON) 15 MG tablet Take 15 mg by mouth at bedtime.     [provider]    Allergies Sulfa antibiotics  History reviewed. No pertinent family history.  Social History Social History   Tobacco Use  . Smoking status: Former Smoker    Last attempt to quit: 09/12/2007    Years since quitting: 9.9  . Smokeless tobacco: Never Used  Substance Use Topics  . Alcohol use: Yes    Comment: 1-2 glass of wine a month.  . Drug use: No    Review of Systems  Constitutional: Negative for fever. Gastrointestinal: Negative for abdominal pain, vomiting and diarrhea. Skin: Positive for redness, swelling and pain of left labia. Neurological: Negative for headaches, focal weakness or numbness. ____________________________________________  PHYSICAL EXAM:  VITAL SIGNS: ED Triage Vitals  Enc Vitals Group     BP 08/09/17 1323 123/73     Pulse --      Resp 08/09/17 1323 18     Temp 08/09/17 1323 98.1 F (36.7 C)     Temp Source 08/09/17 1323 Oral     SpO2 08/09/17 1323 94 %     Weight 08/09/17 1326 120 lb (  54.4 kg)     Height 08/09/17 1326 5\' 3"  (1.6 m)     Head Circumference --      Peak Flow --      Pain Score 08/09/17 1324 10     Pain Loc --      Pain Edu? --      Excl. in [place]? --     Constitutional: Alert and oriented. Well appearing and in no distress. Gastrointestinal: Soft and nontender. No distention. Neurologic: Normal speech and language.  Skin: 3 cm x 2 cm abscess noted of left labia.  Hard and nonfluctuant.  PROCEDURES  .Marland KitchenIncision and Drainage Date/Time: 08/09/2017 4:14 PM Performed by: [name], NP Authorized by: [name], NP   Consent:    Consent obtained:  Verbal   Consent given by:  Patient   Risks discussed:  Bleeding, incomplete drainage, pain and  infection Location:    Type:  Abscess   Location:  Anogenital   Anogenital location:  [place] Pre-procedure details:    Skin preparation:  Betadine Anesthesia (see MAR for exact dosages):    Anesthesia method:  Local infiltration   Local anesthetic:  Lidocaine 1% w/o epi Procedure type:    Complexity:  Complex Procedure details:    Incision types:  Single straight   Incision depth:  Subcutaneous   Scalpel blade:  11   Wound management:  Probed and deloculated and irrigated with saline   Drainage:  Bloody and purulent   Drainage amount:  Moderate   Wound treatment:  Wound left open   Packing materials:  1/4 in gauze and wick placed Post-procedure details:    Patient tolerance of procedure:  Tolerated well, no immediate complications  ________________________________  INITIAL IMPRESSION / ASSESSMENT AND PLAN / ED COURSE  Labial abscess:  Performed I&D-see procedure note Remove packing in 24 to 48 hours Okay to continue warm compresses for comfort. Tylenol as needed for pain Rx for Augmentin twice daily x10 days. Follow-up with PCP in 2 days to have packing removed and for wound check  FINAL CLINICAL IMPRESSION(S) / ED DIAGNOSES  Final diagnoses:  Labial abscess      [name], NP 08/09/17 1616    Schuyler Amor, MD 08/09/17 2252

## 2017-08-12 ENCOUNTER — Other Ambulatory Visit: Payer: Self-pay | Admitting: Neurology

## 2017-08-12 DIAGNOSIS — R4189 Other symptoms and signs involving cognitive functions and awareness: Secondary | ICD-10-CM

## 2017-08-21 ENCOUNTER — Ambulatory Visit
Admission: RE | Admit: 2017-08-21 | Discharge: 2017-08-21 | Disposition: A | Discharge status: Home / Self Care | Payer: Medicare Other | Source: Ambulatory Visit | Attending: Neurology | Admitting: Neurology

## 2017-08-21 DIAGNOSIS — I6782 Cerebral ischemia: Secondary | ICD-10-CM | POA: Diagnosis not present

## 2017-08-21 DIAGNOSIS — R4189 Other symptoms and signs involving cognitive functions and awareness: Secondary | ICD-10-CM | POA: Insufficient documentation

## 2017-09-18 ENCOUNTER — Encounter
Admission: RE | Admit: 2017-09-18 | Discharge: 2017-09-18 | Disposition: A | Discharge status: Home / Self Care | Payer: Medicare Other | Source: Ambulatory Visit | Attending: Internal Medicine | Admitting: Internal Medicine

## 2017-09-21 ENCOUNTER — Other Ambulatory Visit
Admission: RE | Admit: 2017-09-21 | Discharge: 2017-09-21 | Disposition: A | Discharge status: Home / Self Care | Payer: Medicare Other | Source: Ambulatory Visit | Attending: Gerontology | Admitting: Gerontology

## 2017-09-21 DIAGNOSIS — R4182 Altered mental status, unspecified: Secondary | ICD-10-CM | POA: Insufficient documentation

## 2017-09-21 DIAGNOSIS — R5381 Other malaise: Secondary | ICD-10-CM | POA: Insufficient documentation

## 2017-09-21 LAB — CBC WITH DIFFERENTIAL/PLATELET
Basophils Absolute: 0 10*3/uL (ref 0–0.1)
Basophils Relative: 0 %
EOS ABS: 0 10*3/uL (ref 0–0.7)
EOS PCT: 0 %
HCT: 27.8 % — ABNORMAL LOW (ref 35.0–47.0)
Hemoglobin: 9 g/dL — ABNORMAL LOW (ref 12.0–16.0)
Lymphocytes Relative: 19 %
Lymphs Abs: 1 10*3/uL (ref 1.0–3.6)
MCH: 30.6 pg (ref 26.0–34.0)
MCHC: 32.3 g/dL (ref 32.0–36.0)
MCV: 94.7 fL (ref 80.0–100.0)
MONOS PCT: 12 %
Monocytes Absolute: 0.6 10*3/uL (ref 0.2–0.9)
Neutro Abs: 3.9 10*3/uL (ref 1.4–6.5)
Neutrophils Relative %: 69 %
PLATELETS: 141 10*3/uL — AB (ref 150–440)
RBC: 2.93 MIL/uL — AB (ref 3.80–5.20)
RDW: 20.9 % — AB (ref 11.5–14.5)
WBC: 5.6 10*3/uL (ref 3.6–11.0)

## 2017-09-21 LAB — COMPREHENSIVE METABOLIC PANEL
ALT: 24 U/L (ref 14–54)
ANION GAP: 11 (ref 5–15)
AST: 30 U/L (ref 15–41)
Albumin: 2.8 g/dL — ABNORMAL LOW (ref 3.5–5.0)
Alkaline Phosphatase: 145 U/L — ABNORMAL HIGH (ref 38–126)
BUN: 20 mg/dL (ref 6–20)
CHLORIDE: 96 mmol/L — AB (ref 101–111)
CO2: 35 mmol/L — ABNORMAL HIGH (ref 22–32)
Calcium: 8.5 mg/dL — ABNORMAL LOW (ref 8.9–10.3)
Creatinine, Ser: 0.7 mg/dL (ref 0.44–1.00)
GFR calc non Af Amer: >60 mL/min (ref >60)
Glucose, Bld: 256 mg/dL — ABNORMAL HIGH (ref 65–99)
POTASSIUM: 3.2 mmol/L — AB (ref 3.5–5.1)
Sodium: 142 mmol/L (ref 135–145)
Total Bilirubin: 2.2 mg/dL — ABNORMAL HIGH (ref 0.3–1.2)
Total Protein: 5.8 g/dL — ABNORMAL LOW (ref 6.5–8.1)

## 2017-09-21 LAB — MAGNESIUM: MAGNESIUM: 2 mg/dL (ref 1.7–2.4)

## 2017-09-21 LAB — TSH: TSH: 3.45 u[IU]/mL (ref 0.350–4.500)

## 2017-09-21 LAB — VITAMIN B12: VITAMIN B 12: 603 pg/mL (ref 180–914)

## 2017-09-22 LAB — VITAMIN D 25 HYDROXY (VIT D DEFICIENCY, FRACTURES): Vit D, 25-Hydroxy: 30.7 ng/mL (ref 30.0–100.0)

## 2017-09-23 ENCOUNTER — Encounter: Payer: Self-pay | Admitting: Radiology

## 2017-09-23 ENCOUNTER — Emergency Department: Payer: Medicare Other

## 2017-09-23 ENCOUNTER — Inpatient Hospital Stay: Payer: Medicare Other

## 2017-09-23 ENCOUNTER — Other Ambulatory Visit: Payer: Self-pay

## 2017-09-23 ENCOUNTER — Inpatient Hospital Stay
Admission: EM | Admit: 2017-09-23 | Discharge: 2017-10-06 | DRG: 871 | Disposition: A | Discharge status: Skilled Nursing Facility | Payer: Medicare Other | Source: Skilled Nursing Facility | Attending: Specialist | Admitting: Specialist

## 2017-09-23 ENCOUNTER — Non-Acute Institutional Stay (SKILLED_NURSING_FACILITY): Payer: Medicare Other | Admitting: Gerontology

## 2017-09-23 DIAGNOSIS — J189 Pneumonia, unspecified organism: Secondary | ICD-10-CM

## 2017-09-23 DIAGNOSIS — Z978 Presence of other specified devices: Secondary | ICD-10-CM

## 2017-09-23 DIAGNOSIS — I472 Ventricular tachycardia: Secondary | ICD-10-CM | POA: Diagnosis present

## 2017-09-23 DIAGNOSIS — E785 Hyperlipidemia, unspecified: Secondary | ICD-10-CM | POA: Diagnosis present

## 2017-09-23 DIAGNOSIS — I248 Other forms of acute ischemic heart disease: Secondary | ICD-10-CM | POA: Diagnosis present

## 2017-09-23 DIAGNOSIS — K922 Gastrointestinal hemorrhage, unspecified: Secondary | ICD-10-CM

## 2017-09-23 DIAGNOSIS — I110 Hypertensive heart disease with heart failure: Secondary | ICD-10-CM | POA: Diagnosis present

## 2017-09-23 DIAGNOSIS — R29898 Other symptoms and signs involving the musculoskeletal system: Secondary | ICD-10-CM | POA: Diagnosis not present

## 2017-09-23 DIAGNOSIS — G9341 Metabolic encephalopathy: Secondary | ICD-10-CM | POA: Diagnosis present

## 2017-09-23 DIAGNOSIS — E43 Unspecified severe protein-calorie malnutrition: Secondary | ICD-10-CM | POA: Diagnosis present

## 2017-09-23 DIAGNOSIS — Z8542 Personal history of malignant neoplasm of other parts of uterus: Secondary | ICD-10-CM

## 2017-09-23 DIAGNOSIS — J9601 Acute respiratory failure with hypoxia: Secondary | ICD-10-CM | POA: Diagnosis present

## 2017-09-23 DIAGNOSIS — Z8572 Personal history of non-Hodgkin lymphomas: Secondary | ICD-10-CM | POA: Diagnosis not present

## 2017-09-23 DIAGNOSIS — K625 Hemorrhage of anus and rectum: Secondary | ICD-10-CM | POA: Diagnosis not present

## 2017-09-23 DIAGNOSIS — I5032 Chronic diastolic (congestive) heart failure: Secondary | ICD-10-CM | POA: Diagnosis present

## 2017-09-23 DIAGNOSIS — E119 Type 2 diabetes mellitus without complications: Secondary | ICD-10-CM | POA: Diagnosis present

## 2017-09-23 DIAGNOSIS — J90 Pleural effusion, not elsewhere classified: Secondary | ICD-10-CM | POA: Diagnosis not present

## 2017-09-23 DIAGNOSIS — Z87891 Personal history of nicotine dependence: Secondary | ICD-10-CM | POA: Diagnosis not present

## 2017-09-23 DIAGNOSIS — I4901 Ventricular fibrillation: Secondary | ICD-10-CM | POA: Diagnosis present

## 2017-09-23 DIAGNOSIS — J9600 Acute respiratory failure, unspecified whether with hypoxia or hypercapnia: Secondary | ICD-10-CM

## 2017-09-23 DIAGNOSIS — I482 Chronic atrial fibrillation: Secondary | ICD-10-CM | POA: Diagnosis present

## 2017-09-23 DIAGNOSIS — Z90710 Acquired absence of both cervix and uterus: Secondary | ICD-10-CM | POA: Diagnosis not present

## 2017-09-23 DIAGNOSIS — L89313 Pressure ulcer of right buttock, stage 3: Secondary | ICD-10-CM

## 2017-09-23 DIAGNOSIS — E876 Hypokalemia: Secondary | ICD-10-CM | POA: Diagnosis not present

## 2017-09-23 DIAGNOSIS — Z9049 Acquired absence of other specified parts of digestive tract: Secondary | ICD-10-CM | POA: Diagnosis not present

## 2017-09-23 DIAGNOSIS — R7989 Other specified abnormal findings of blood chemistry: Secondary | ICD-10-CM

## 2017-09-23 DIAGNOSIS — Z7901 Long term (current) use of anticoagulants: Secondary | ICD-10-CM | POA: Diagnosis not present

## 2017-09-23 DIAGNOSIS — E118 Type 2 diabetes mellitus with unspecified complications: Secondary | ICD-10-CM | POA: Diagnosis not present

## 2017-09-23 DIAGNOSIS — R627 Adult failure to thrive: Secondary | ICD-10-CM | POA: Diagnosis not present

## 2017-09-23 DIAGNOSIS — J9621 Acute and chronic respiratory failure with hypoxia: Secondary | ICD-10-CM | POA: Diagnosis present

## 2017-09-23 DIAGNOSIS — Z7189 Other specified counseling: Secondary | ICD-10-CM

## 2017-09-23 DIAGNOSIS — Z6821 Body mass index (BMI) 21.0-21.9, adult: Secondary | ICD-10-CM

## 2017-09-23 DIAGNOSIS — D62 Acute posthemorrhagic anemia: Secondary | ICD-10-CM | POA: Diagnosis present

## 2017-09-23 DIAGNOSIS — M069 Rheumatoid arthritis, unspecified: Secondary | ICD-10-CM | POA: Diagnosis present

## 2017-09-23 DIAGNOSIS — R601 Generalized edema: Secondary | ICD-10-CM | POA: Diagnosis not present

## 2017-09-23 DIAGNOSIS — H6593 Unspecified nonsuppurative otitis media, bilateral: Secondary | ICD-10-CM | POA: Diagnosis not present

## 2017-09-23 DIAGNOSIS — J151 Pneumonia due to Pseudomonas: Secondary | ICD-10-CM | POA: Diagnosis present

## 2017-09-23 DIAGNOSIS — Z0189 Encounter for other specified special examinations: Secondary | ICD-10-CM

## 2017-09-23 DIAGNOSIS — Z9181 History of falling: Secondary | ICD-10-CM

## 2017-09-23 DIAGNOSIS — R0902 Hypoxemia: Secondary | ICD-10-CM

## 2017-09-23 DIAGNOSIS — Z8673 Personal history of transient ischemic attack (TIA), and cerebral infarction without residual deficits: Secondary | ICD-10-CM | POA: Diagnosis not present

## 2017-09-23 DIAGNOSIS — Z7984 Long term (current) use of oral hypoglycemic drugs: Secondary | ICD-10-CM

## 2017-09-23 DIAGNOSIS — R4182 Altered mental status, unspecified: Secondary | ICD-10-CM | POA: Insufficient documentation

## 2017-09-23 DIAGNOSIS — L89151 Pressure ulcer of sacral region, stage 1: Secondary | ICD-10-CM | POA: Diagnosis present

## 2017-09-23 DIAGNOSIS — A419 Sepsis, unspecified organism: Principal | ICD-10-CM | POA: Diagnosis present

## 2017-09-23 DIAGNOSIS — E7800 Pure hypercholesterolemia, unspecified: Secondary | ICD-10-CM | POA: Diagnosis present

## 2017-09-23 DIAGNOSIS — J9690 Respiratory failure, unspecified, unspecified whether with hypoxia or hypercapnia: Secondary | ICD-10-CM

## 2017-09-23 DIAGNOSIS — Z515 Encounter for palliative care: Secondary | ICD-10-CM

## 2017-09-23 DIAGNOSIS — J181 Lobar pneumonia, unspecified organism: Secondary | ICD-10-CM

## 2017-09-23 DIAGNOSIS — E872 Acidosis: Secondary | ICD-10-CM | POA: Diagnosis not present

## 2017-09-23 DIAGNOSIS — Z9851 Tubal ligation status: Secondary | ICD-10-CM

## 2017-09-23 DIAGNOSIS — R401 Stupor: Secondary | ICD-10-CM

## 2017-09-23 LAB — COMPREHENSIVE METABOLIC PANEL WITH GFR
ALT: 24 U/L (ref 14–54)
AST: 39 U/L (ref 15–41)
Albumin: 2.9 g/dL — ABNORMAL LOW (ref 3.5–5.0)
Alkaline Phosphatase: 132 U/L — ABNORMAL HIGH (ref 38–126)
Anion gap: 11 (ref 5–15)
BUN: 20 mg/dL (ref 6–20)
CO2: 35 mmol/L — ABNORMAL HIGH (ref 22–32)
Calcium: 8.2 mg/dL — ABNORMAL LOW (ref 8.9–10.3)
Chloride: 96 mmol/L — ABNORMAL LOW (ref 101–111)
Creatinine, Ser: 0.7 mg/dL (ref 0.44–1.00)
GFR calc Af Amer: >60 mL/min
GFR calc non Af Amer: >60 mL/min
Glucose, Bld: 313 mg/dL — ABNORMAL HIGH (ref 65–99)
Potassium: 4.8 mmol/L (ref 3.5–5.1)
Sodium: 142 mmol/L (ref 135–145)
Total Bilirubin: 2.2 mg/dL — ABNORMAL HIGH (ref 0.3–1.2)
Total Protein: 5.8 g/dL — ABNORMAL LOW (ref 6.5–8.1)

## 2017-09-23 LAB — LACTIC ACID, PLASMA
LACTIC ACID, VENOUS: 7.5 mmol/L — AB (ref 0.5–1.9)
LACTIC ACID, VENOUS: 4.1 mmol/L — AB (ref 0.5–1.9)
Lactic Acid, Venous: 3.6 mmol/L (ref 0.5–1.9)

## 2017-09-23 LAB — APTT: aPTT: 32 seconds (ref 24–36)

## 2017-09-23 LAB — URINALYSIS, ROUTINE W REFLEX MICROSCOPIC
BILIRUBIN URINE: NEGATIVE
Bacteria, UA: NONE SEEN
GLUCOSE, UA: 50 mg/dL — AB
Ketones, ur: NEGATIVE mg/dL
LEUKOCYTES UA: NEGATIVE
Nitrite: NEGATIVE
PH: 5 (ref 5.0–8.0)
Protein, ur: NEGATIVE mg/dL
SPECIFIC GRAVITY, URINE: 1.008 (ref 1.005–1.030)
WBC UA: NONE SEEN WBC/hpf (ref 0–5)

## 2017-09-23 LAB — CBC WITH DIFFERENTIAL/PLATELET
Basophils Absolute: 0 K/uL (ref 0–0.1)
Basophils Relative: 0 %
Eosinophils Absolute: 0 K/uL (ref 0–0.7)
Eosinophils Relative: 0 %
HCT: 26.1 % — ABNORMAL LOW (ref 35.0–47.0)
Hemoglobin: 8.4 g/dL — ABNORMAL LOW (ref 12.0–16.0)
Lymphocytes Relative: 17 %
Lymphs Abs: 1.2 K/uL (ref 1.0–3.6)
MCH: 31.1 pg (ref 26.0–34.0)
MCHC: 32.3 g/dL (ref 32.0–36.0)
MCV: 96.5 fL (ref 80.0–100.0)
Monocytes Absolute: 0.4 K/uL (ref 0.2–0.9)
Monocytes Relative: 6 %
Neutro Abs: 5.2 K/uL (ref 1.4–6.5)
Neutrophils Relative %: 77 %
Platelets: 163 K/uL (ref 150–440)
RBC: 2.7 MIL/uL — ABNORMAL LOW (ref 3.80–5.20)
RDW: 21.9 % — ABNORMAL HIGH (ref 11.5–14.5)
WBC: 6.8 K/uL (ref 3.6–11.0)

## 2017-09-23 LAB — URINALYSIS, COMPLETE (UACMP) WITH MICROSCOPIC
Bacteria, UA: NONE SEEN
Bilirubin Urine: NEGATIVE
Glucose, UA: NEGATIVE mg/dL
KETONES UR: NEGATIVE mg/dL
Leukocytes, UA: NEGATIVE
Nitrite: NEGATIVE
PH: 5 (ref 5.0–8.0)
PROTEIN: 30 mg/dL — AB
Specific Gravity, Urine: >1.046 — ABNORMAL HIGH (ref 1.005–1.030)

## 2017-09-23 LAB — PHOSPHORUS: Phosphorus: 2.3 mg/dL — ABNORMAL LOW (ref 2.5–4.6)

## 2017-09-23 LAB — TROPONIN I
Troponin I: 0.05 ng/mL (ref <0.03)
Troponin I: 0.04 ng/mL (ref <0.03)

## 2017-09-23 LAB — PROTIME-INR
INR: 2.5
Prothrombin Time: 26.8 seconds — ABNORMAL HIGH (ref 11.4–15.2)

## 2017-09-23 LAB — BLOOD GAS, ARTERIAL
ACID-BASE EXCESS: 12.1 mmol/L — AB (ref 0.0–2.0)
BICARBONATE: 36.6 mmol/L — AB (ref 20.0–28.0)
FIO2: 1
MECHVT: 450 mL
O2 SAT: 100 %
PATIENT TEMPERATURE: 37
PCO2 ART: 48 mmHg (ref 32.0–48.0)
PEEP/CPAP: 5 cmH2O
PH ART: 7.49 — AB (ref 7.350–7.450)
RATE: 15 resp/min
pO2, Arterial: 352 mmHg — ABNORMAL HIGH (ref 83.0–108.0)

## 2017-09-23 LAB — HEMOGLOBIN AND HEMATOCRIT, BLOOD
HEMATOCRIT: 29.3 % — AB (ref 35.0–47.0)
Hemoglobin: 9.7 g/dL — ABNORMAL LOW (ref 12.0–16.0)

## 2017-09-23 LAB — POTASSIUM: POTASSIUM: 6.3 mmol/L — AB (ref 3.5–5.1)

## 2017-09-23 LAB — PROCALCITONIN: Procalcitonin: 0.19 ng/mL

## 2017-09-23 LAB — GLUCOSE, CAPILLARY
Glucose-Capillary: 286 mg/dL — ABNORMAL HIGH (ref 65–99)
Glucose-Capillary: 305 mg/dL — ABNORMAL HIGH (ref 65–99)
Glucose-Capillary: 336 mg/dL — ABNORMAL HIGH (ref 65–99)

## 2017-09-23 LAB — BRAIN NATRIURETIC PEPTIDE: B NATRIURETIC PEPTIDE 5: 1095 pg/mL — AB (ref 0.0–100.0)

## 2017-09-23 LAB — MAGNESIUM: Magnesium: 1.9 mg/dL (ref 1.7–2.4)

## 2017-09-23 LAB — MRSA PCR SCREENING: MRSA by PCR: NEGATIVE

## 2017-09-23 LAB — PREPARE RBC (CROSSMATCH)

## 2017-09-23 MED ORDER — SODIUM CHLORIDE 0.9 % IV SOLN
INTRAVENOUS | Status: DC
  Administered 2017-09-24: 2.8 [IU]/h via INTRAVENOUS
  Filled 2017-09-23: qty 1

## 2017-09-23 MED ORDER — SUCCINYLCHOLINE CHLORIDE 20 MG/ML IJ SOLN
INTRAMUSCULAR | Status: AC | PRN
  Administered 2017-09-23: 100 mg via INTRAVENOUS

## 2017-09-23 MED ORDER — INSULIN ASPART 100 UNIT/ML ~~LOC~~ SOLN: 0.0000 [IU] | Freq: Three times a day (TID) | SUBCUTANEOUS | Status: DC

## 2017-09-23 MED ORDER — VANCOMYCIN HCL IN DEXTROSE 1-5 GM/200ML-% IV SOLN
1000.0000 mg | Freq: Once | INTRAVENOUS | Status: AC
  Administered 2017-09-23: 1000 mg via INTRAVENOUS
  Filled 2017-09-23: qty 200

## 2017-09-23 MED ORDER — AMIODARONE HCL IN DEXTROSE 360-4.14 MG/200ML-% IV SOLN
30.0000 mg/h | INTRAVENOUS | Status: DC
  Administered 2017-09-24 – 2017-09-27 (×8): 30 mg/h via INTRAVENOUS
  Filled 2017-09-23 (×7): qty 200

## 2017-09-23 MED ORDER — ACETAMINOPHEN 650 MG RE SUPP: 650.0000 mg | Freq: Four times a day (QID) | RECTAL | Status: DC | PRN

## 2017-09-23 MED ORDER — FENTANYL 2500MCG IN NS 250ML (10MCG/ML) PREMIX INFUSION
0.0000 ug/h | INTRAVENOUS | Status: DC
  Administered 2017-09-23: 50 ug/h via INTRAVENOUS
  Filled 2017-09-23: qty 250

## 2017-09-23 MED ORDER — PANTOPRAZOLE SODIUM 40 MG IV SOLR
40.0000 mg | Freq: Two times a day (BID) | INTRAVENOUS | Status: DC
  Administered 2017-09-23 – 2017-10-03 (×21): 40 mg via INTRAVENOUS
  Filled 2017-09-23 (×21): qty 40

## 2017-09-23 MED ORDER — FENTANYL 2500MCG IN NS 250ML (10MCG/ML) PREMIX INFUSION
25.0000 ug/h | INTRAVENOUS | Status: DC
  Administered 2017-09-23: 25 ug/h via INTRAVENOUS
  Administered 2017-09-25: 75 ug/h via INTRAVENOUS
  Filled 2017-09-23: qty 250

## 2017-09-23 MED ORDER — ORAL CARE MOUTH RINSE
15.0000 mL | OROMUCOSAL | Status: DC
  Administered 2017-09-23 – 2017-09-25 (×16): 15 mL via OROMUCOSAL

## 2017-09-23 MED ORDER — DEXTROSE 50 % IV SOLN
1.0000 | Freq: Once | INTRAVENOUS | Status: AC
  Administered 2017-09-23: 50 mL via INTRAVENOUS
  Filled 2017-09-23: qty 50

## 2017-09-23 MED ORDER — AMIODARONE HCL IN DEXTROSE 360-4.14 MG/200ML-% IV SOLN
60.0000 mg/h | INTRAVENOUS | Status: AC
Start: 2017-09-23 — End: 2017-09-24
  Administered 2017-09-23 – 2017-09-24 (×2): 60 mg/h via INTRAVENOUS
  Filled 2017-09-23: qty 200

## 2017-09-23 MED ORDER — SODIUM CHLORIDE 0.9 % IV SOLN
1000.0000 mL | Freq: Once | INTRAVENOUS | Status: AC
  Administered 2017-09-23: 1000 mL via INTRAVENOUS

## 2017-09-23 MED ORDER — SODIUM CHLORIDE 0.9 % IV BOLUS
1000.0000 mL | Freq: Once | INTRAVENOUS | Status: AC
  Administered 2017-09-23: 1000 mL via INTRAVENOUS

## 2017-09-23 MED ORDER — SENNOSIDES 8.8 MG/5ML PO SYRP
5.0000 mL | ORAL_SOLUTION | Freq: Two times a day (BID) | ORAL | Status: DC | PRN
  Filled 2017-09-23: qty 5

## 2017-09-23 MED ORDER — SODIUM CHLORIDE 0.9 % IV SOLN
10.0000 mL/h | Freq: Once | INTRAVENOUS | Status: AC
  Administered 2017-09-23: 10 mL/h via INTRAVENOUS

## 2017-09-23 MED ORDER — CHLORHEXIDINE GLUCONATE 0.12% ORAL RINSE (MEDLINE KIT)
15.0000 mL | Freq: Two times a day (BID) | OROMUCOSAL | Status: DC
  Administered 2017-09-23 – 2017-09-25 (×4): 15 mL via OROMUCOSAL

## 2017-09-23 MED ORDER — HYDROMORPHONE HCL 1 MG/ML IJ SOLN
1.0000 mg | Freq: Once | INTRAMUSCULAR | Status: AC
  Administered 2017-09-23: 1 mg via INTRAVENOUS
  Filled 2017-09-23: qty 1

## 2017-09-23 MED ORDER — FUROSEMIDE 10 MG/ML IJ SOLN
20.0000 mg | Freq: Once | INTRAMUSCULAR | Status: AC
  Administered 2017-09-23: 20 mg via INTRAVENOUS
  Filled 2017-09-23: qty 2

## 2017-09-23 MED ORDER — IPRATROPIUM-ALBUTEROL 0.5-2.5 (3) MG/3ML IN SOLN
3.0000 mL | Freq: Four times a day (QID) | RESPIRATORY_TRACT | Status: DC | PRN
  Administered 2017-09-26 – 2017-09-30 (×2): 3 mL via RESPIRATORY_TRACT
  Filled 2017-09-23 (×3): qty 3

## 2017-09-23 MED ORDER — IOPAMIDOL (ISOVUE-370) INJECTION 76%
75.0000 mL | Freq: Once | INTRAVENOUS | Status: AC | PRN
  Administered 2017-09-23: 75 mL via INTRAVENOUS

## 2017-09-23 MED ORDER — PIPERACILLIN-TAZOBACTAM 3.375 G IVPB
3.3750 g | Freq: Three times a day (TID) | INTRAVENOUS | Status: AC
  Administered 2017-09-23 – 2017-09-28 (×16): 3.375 g via INTRAVENOUS
  Filled 2017-09-23 (×16): qty 50

## 2017-09-23 MED ORDER — MIDAZOLAM HCL 2 MG/2ML IJ SOLN: 1.0000 mg | INTRAMUSCULAR | Status: DC | PRN

## 2017-09-23 MED ORDER — SODIUM CHLORIDE 0.9 % IV SOLN
INTRAVENOUS | Status: DC
  Administered 2017-09-23: 19:00:00 via INTRAVENOUS

## 2017-09-23 MED ORDER — FENTANYL BOLUS VIA INFUSION
25.0000 ug | INTRAVENOUS | Status: DC | PRN
  Administered 2017-09-23 – 2017-09-25 (×2): 25 ug via INTRAVENOUS
  Filled 2017-09-23: qty 25

## 2017-09-23 MED ORDER — AMIODARONE HCL IN DEXTROSE 360-4.14 MG/200ML-% IV SOLN
INTRAVENOUS | Status: AC
  Administered 2017-09-23: 150 mg via INTRAVENOUS
  Filled 2017-09-23: qty 200

## 2017-09-23 MED ORDER — ACETAMINOPHEN 325 MG PO TABS: 650.0000 mg | ORAL_TABLET | Freq: Four times a day (QID) | ORAL | Status: DC | PRN

## 2017-09-23 MED ORDER — PROPOFOL 1000 MG/100ML IV EMUL
5.0000 ug/kg/min | Freq: Once | INTRAVENOUS | Status: DC
  Administered 2017-09-23: 5 ug/kg/min via INTRAVENOUS

## 2017-09-23 MED ORDER — MIDAZOLAM HCL 2 MG/2ML IJ SOLN
1.0000 mg | INTRAMUSCULAR | Status: DC | PRN
  Filled 2017-09-23: qty 2

## 2017-09-23 MED ORDER — ETOMIDATE 2 MG/ML IV SOLN
INTRAVENOUS | Status: AC | PRN
  Administered 2017-09-23: 20 mg via INTRAVENOUS

## 2017-09-23 MED ORDER — PROPOFOL 1000 MG/100ML IV EMUL
INTRAVENOUS | Status: AC
  Administered 2017-09-23: 5 ug/kg/min via INTRAVENOUS
  Filled 2017-09-23: qty 100

## 2017-09-23 MED ORDER — AMIODARONE LOAD VIA INFUSION
150.0000 mg | Freq: Once | INTRAVENOUS | Status: AC
  Administered 2017-09-23: 150 mg via INTRAVENOUS
  Filled 2017-09-23: qty 83.34

## 2017-09-23 MED ORDER — FENTANYL CITRATE (PF) 100 MCG/2ML IJ SOLN: 50.0000 ug | Freq: Once | INTRAMUSCULAR | Status: DC

## 2017-09-23 MED ORDER — INSULIN ASPART 100 UNIT/ML IV SOLN
10.0000 [IU] | Freq: Once | INTRAVENOUS | Status: AC
  Administered 2017-09-23: 10 [IU] via INTRAVENOUS
  Filled 2017-09-23: qty 0.1

## 2017-09-23 MED ORDER — PIPERACILLIN-TAZOBACTAM 3.375 G IVPB 30 MIN
3.3750 g | Freq: Once | INTRAVENOUS | Status: AC
  Administered 2017-09-23: 3.375 g via INTRAVENOUS
  Filled 2017-09-23: qty 50

## 2017-09-23 NOTE — ED Notes (Signed)
Lab called to check on lactic acid, staff state it is still in process.

## 2017-09-23 NOTE — Progress Notes (Signed)
CODE SEPSIS - PHARMACY COMMUNICATION  **Broad Spectrum Antibiotics should be administered within 1 hour of Sepsis diagnosis**  Time Code Sepsis Called/Page Received: 1319  Antibiotics Ordered: zosyn/vanc  Time of 1st antibiotic administration:  Antibiotics Given (last 72 hours)     Date/Time Action Medication Dose Rate   09/23/17 1400 New Bag/Given   piperacillin-tazobactam (ZOSYN) IVPB 3.375 g 3.375 g 100 mL/hr   09/23/17 1430 New Bag/Given   vancomycin (VANCOCIN) IVPB 1000 mg/200 mL premix 1,000 mg 200 mL/hr        Additional action taken by pharmacy: None   If necessary, Name of Provider/Nurse Contacted: none   Thomasenia Sales, PharmD, MBA, BCGP Clinical Pharmacist 09/23/2017  2:43 PM

## 2017-09-23 NOTE — ED Provider Notes (Signed)
[facility] Emergency Department Provider Note   ____________________________________________    I have reviewed the triage vital signs and the nursing notes.   HISTORY  Chief Complaint Unresponsive     HPI Nina Hudson is a 72 y.o. female who presents from nursing home with altered mental status and shortness of breath.  Patient apparently recently discharged from [facility] after complicated cholecystectomy with postop bleeding followed by ERCP with stent placement for choledocho lithiasis.  Apparently had a x-ray performed at nursing home concerning for pneumonia found to be unresponsive prior to arrival.  No further history available at this time.  Nursing home documentation indicates the patient is full code   Past Medical History:  Diagnosis Date  . Arthritis   . Cancer (HCC)    diffuse large B cell lymphoma  . Cancer ([place])    uterine  . Depression   . Diabetes mellitus without complication ([place])   . Dysrhythmia    A-fib  . Hypercholesteremia   . Hypertension   . Stroke [facility])     Patient Active Problem List   Diagnosis Date Noted  . Pneumonia 09/23/2017  . Altered mental status 09/23/2017    Past Surgical History:  Procedure Laterality Date  . ABDOMINAL HYSTERECTOMY    . APPENDECTOMY    . BUNIONECTOMY Bilateral   . FRACTURE SURGERY Right    ankle  . MASS EXCISION N/A 09/28/2015   Procedure: EXCISION OF 2 CYSTIC MASSES FROM MEDIAL STERNUM;  Surgeon: Dereck Leep, MD;  Location: [facility] ORS;  Service: Orthopedics;  Laterality: N/A;  . rheumatoid nodual removal    . TUBAL LIGATION      Prior to Admission medications   Medication Sig Start Date End Date Taking? Authorizing Provider  albuterol (PROVENTIL) (2.5 MG/3ML) 0.083% nebulizer solution Take 2.5 mg by nebulization every 6 (six) hours as needed for wheezing or shortness of breath.   Yes [provider]  atorvastatin (LIPITOR) 40 MG tablet Take 40 mg by mouth daily.  07/13/17  Yes [provider]  carboxymethylcellulose (REFRESH PLUS) 0.5 % SOLN Place 2 drops into both eyes 3 (three) times daily as needed (dry eyes).   Yes [provider]  diltiazem (CARDIZEM CD) 120 MG 24 hr capsule Take 120 mg by mouth daily. 07/13/17  Yes [provider]  donepezil (ARICEPT) 5 MG tablet Take 5 mg by mouth every evening. 08/11/17  Yes [provider]  ELIQUIS 5 MG TABS tablet Take 5 mg by mouth 2 (two) times daily. 09/02/17  Yes [provider]  ferrous sulfate 325 (65 FE) MG tablet Take 325 mg by mouth 2 (two) times daily.   Yes [provider]  gabapentin (NEURONTIN) 100 MG capsule Take 100 mg by mouth every evening. 06/29/17  Yes [provider]  glipiZIDE (GLUCOTROL XL) 5 MG 24 hr tablet Take 5 mg by mouth daily.  08/06/17  Yes [provider]  magnesium oxide (MAG-OX) 400 MG tablet Take 800 mg by mouth daily.   Yes [provider]  Melatonin 3 MG TABS Take 3 mg by mouth at bedtime as needed (sleep).   Yes [provider]  metFORMIN (GLUCOPHAGE) 500 MG tablet Take 500 mg by mouth 2 (two) times daily. 08/06/17  Yes [provider]  metoprolol tartrate (LOPRESSOR) 50 MG tablet Take 50 mg by mouth 2 (two) times daily.   Yes [provider]  oxyCODONE (OXY IR/ROXICODONE) 5 MG immediate release tablet Take 2.5  mg by mouth every 4 (four) hours as needed for severe pain.   Yes [provider]  polyethylene glycol (MIRALAX / GLYCOLAX) packet Take 17 g by mouth daily.   Yes [provider]  potassium chloride (K-DUR,KLOR-CON) 10 MEQ tablet Take 10 mEq by mouth daily.   Yes [provider]  torsemide (DEMADEX) 10 MG tablet Take 10 mg by mouth daily.   Yes [provider]  vitamin C (ASCORBIC ACID) 500 MG tablet Take 500 mg by mouth 2 (two) times daily. (take with ferrous sulfate tablets)   Yes [provider]  amoxicillin-clavulanate  (AUGMENTIN) 875-125 MG tablet Take 1 tablet by mouth 2 (two) times daily. Patient not taking: Reported on 09/23/2017 08/09/17   [name], NP     Allergies Sulfa antibiotics  No family history on file.  Social History Social History   Tobacco Use  . Smoking status: Former Smoker    Last attempt to quit: 09/12/2007    Years since quitting: 10.0  . Smokeless tobacco: Never Used  Substance Use Topics  . Alcohol use: Yes    Comment: 1-2 glass of wine a month.  . Drug use: No    Level 5 caveat: Unable to obtain review of Systems due to patient distress     ____________________________________________   PHYSICAL EXAM:  VITAL SIGNS: ED Triage Vitals [09/23/17 1313]  Enc Vitals Group     BP 123/82     Pulse Rate (!) 110     Resp (!) 23     Temp      Temp src      SpO2 100 %     Weight      Height      Head Circumference      Peak Flow      Pain Score      Pain Loc      Pain Edu?      Excl. in [place]?     Constitutional: Unresponsive, withdraws from pain Eyes: Conjunctivae are normal.  No significant pallor, PERRLA Head: Atraumatic.  Mouth/Throat: Mucous membranes are dry  Cardiovascular: Tachycardia,irregular rhythm. Grossly normal heart sounds.  Good peripheral circulation. Respiratory: Increased respiratory effort, no retractions.  Bibasilar Rales Gastrointestinal: Right upper quadrant surgical scar with significant surrounding bruising extending to the right chest/abdomen, no evidence of infection or cellulitis  Musculoskeletal:  Warm and well perfused Neurologic: Withdraws from pain Skin:  Skin is warm, dry   ____________________________________________   LABS (all labs ordered are listed, but only abnormal results are displayed)  Labs Reviewed  COMPREHENSIVE METABOLIC PANEL - Abnormal; Notable for the following components:      Result Value   Chloride 96 (*)    CO2 35 (*)    Glucose, Bld 313 (*)    Calcium 8.2 (*)    Total Protein 5.8 (*)     Albumin 2.9 (*)    Alkaline Phosphatase 132 (*)    Total Bilirubin 2.2 (*)    All other components within normal limits  CBC WITH DIFFERENTIAL/PLATELET - Abnormal; Notable for the following components:   RBC 2.70 (*)    Hemoglobin 8.4 (*)    HCT 26.1 (*)    RDW 21.9 (*)    All other components within normal limits  URINALYSIS, ROUTINE W REFLEX MICROSCOPIC - Abnormal; Notable for the following components:   Color, Urine STRAW (*)    APPearance HAZY (*)    Glucose, UA 50 (*)    Hgb urine  dipstick SMALL (*)    All other components within normal limits  PROTIME-INR - Abnormal; Notable for the following components:   Prothrombin Time 26.8 (*)    All other components within normal limits  TROPONIN I - Abnormal; Notable for the following components:   Troponin I 0.05 (*)    All other components within normal limits  BLOOD GAS, ARTERIAL - Abnormal; Notable for the following components:   pH, Arterial 7.49 (*)    pO2, Arterial 352 (*)    Bicarbonate 36.6 (*)    Acid-Base Excess 12.1 (*)    All other components within normal limits  CULTURE, BLOOD (ROUTINE X 2)  CULTURE, BLOOD (ROUTINE X 2)  URINE CULTURE  CULTURE, RESPIRATORY (NON-EXPECTORATED)  APTT  LACTIC ACID, PLASMA  LACTIC ACID, PLASMA  TYPE AND SCREEN  PREPARE RBC (CROSSMATCH)  ABO/RH   ____________________________________________  EKG  ED ECG REPORT I, Lavonia Drafts, the attending physician, personally viewed and interpreted this ECG.  Date: 09/23/2017  Rate: 116 Rhythm: atrial fibrillation QRS Axis: Left axis deviation Intervals: normal ST/T Wave abnormalities: normal   ____________________________________________  RADIOLOGY  Ct head, no bleed Cxr, atelectasis vs pneumonia, tube in good position, pleural effusions ____________________________________________   PROCEDURES  Procedure(s) performed: yes  Procedure Name: Intubation Date/Time: 09/23/2017 1:53 PM Performed by: Lavonia Drafts,  MD Pre-anesthesia Checklist: Patient identified, Emergency Drugs available, Suction available and Patient being monitored Oxygen Delivery Method: Ambu bag Preoxygenation: Pre-oxygenation with 100% oxygen Induction Type: IV induction and Rapid sequence Ventilation: Mask ventilation without difficulty Laryngoscope Size: Glidescope and 3 Grade View: Grade II Tube size: 7.5 mm Number of attempts: 1 Placement Confirmation: ETT inserted through vocal cords under direct vision,  CO2 detector and Breath sounds checked- equal and bilateral Secured at: 21 cm Tube secured with: ETT holder Dental Injury: Teeth and Oropharynx as per pre-operative assessment         Critical Care performed: yes  CRITICAL CARE Performed by: Lavonia Drafts   Total critical care time: 50 minutes  Critical care time was exclusive of separately billable procedures and treating other patients.  Critical care was necessary to treat or prevent imminent or life-threatening deterioration.  Critical care was time spent personally by me on the following activities: development of treatment plan with patient and/or surrogate as well as nursing, discussions with consultants, evaluation of patient's response to treatment, examination of patient, obtaining history from patient or surrogate, ordering and performing treatments and interventions, ordering and review of laboratory studies, ordering and review of radiographic studies, pulse oximetry and re-evaluation of patient's condition.  ____________________________________________   INITIAL IMPRESSION / ASSESSMENT AND PLAN / ED COURSE  Pertinent labs & imaging results that were available during my care of the patient were reviewed by me and considered in my medical decision making (see chart for details).  Patient presents with altered mental status, hypoxia and significant distress, agonal breathing.  Intubated emergently started on propofol drip.  Given outpatient  records, concern for pneumonia will treat with IV vancomycin IV Zosyn  Pending labs, chest x-ray, urinalysis  Lab work is significant for hemoglobin of 8.4, mildly decreased from prior however her stool is black she is on Eliquis and INR is 2.5 so consistent with GI bleeding  No abdominal distention or apparent tenderness  Mildly elevated troponin likely related to strain  ABG reassuring  Bibasilar atelectasis on chest x-ray.  Updated family, they are waiting for daughter to join before deciding whether the patient should go back to [facility]  or stay here   ----------------------------------------- 3:49 PM on 09/23/2017 ----------------------------------------- Family wants to keep patient at [facility], they do not want to transfer to [facility].      ____________________________________________   FINAL CLINICAL IMPRESSION(S) / ED DIAGNOSES  Final diagnoses:  Acute respiratory failure with hypoxia ([place])  Altered mental status, unspecified altered mental status type  Gastrointestinal hemorrhage, unspecified gastrointestinal hemorrhage type        Note:  This document was prepared using Dragon voice recognition software and may include unintentional dictation errors.    Lavonia Drafts, MD 09/23/17 [phone]

## 2017-09-23 NOTE — ED Notes (Signed)
Lab called again to check on 1st lactic acid level. Per staff test was not received. 2nd level drawn and sent.

## 2017-09-23 NOTE — H&P (Signed)
[name] [facility] [place] NAME: Nina Hudson    MR#:  [id number]  DATE OF BIRTH:  Sep 11, 1945  DATE OF ADMISSION:  09/23/2017  PRIMARY CARE PHYSICIAN: Derinda Late, MD   REQUESTING/REFERRING PHYSICIAN: Dr Corky Downs  CHIEF COMPLAINT:   Shortness of breath progressive weakness and lethargy HISTORY OF PRESENT ILLNESS:  Nina Hudson  is a 72 y.o. female with a known history of diffuse large B cell lymphoma, uterine cancer, history of a fib on eliquis, diabetes, comes to the emergency room from [facility] where she was admitted on 614 2019. Patient had complicated stay at [facility] from 62 2019 to 614 2019. She was admitted with acute cholecystitis/choledocholithiasis underwent ERCP with stent placement and stone extraction thereafter underwent lap Chole not complicated patient became unresponsive was intubated placed on the ventilator underwent exploratory laparotomy the same day and found to have hemoperitoneum secondary to cystic artery stump bleed. Patient thereafter was discharged to [facility] patient came in today with lethargy, labored breathing with sats in the lower 80s on 4 L nasal cannula. Patient was intubated and now placed on the ventilator  patient was guaic positive her hemoglobin dropped from 10.2-- 8.4 currently she is getting one unit of blood transfusion.  Patient is being admitted type for acute on chronic hypoxic respiratory failure secondary to suspected pneumonia/G.I. bleed/acute on chronic anemia.  Received IV vanc and Zosyn in the ER  Lot of family members in the ER room  PAST MEDICAL HISTORY:   Past Medical History:  Diagnosis Date  . Arthritis   . Cancer (HCC)    diffuse large B cell lymphoma  . Cancer ([place])    uterine  . Depression   . Diabetes mellitus without complication ([place])   . Dysrhythmia    A-fib  . Hypercholesteremia   . Hypertension   . Stroke [facility])     PAST SURGICAL HISTOIRY:   Past Surgical  History:  Procedure Laterality Date  . ABDOMINAL HYSTERECTOMY    . APPENDECTOMY    . BUNIONECTOMY Bilateral   . FRACTURE SURGERY Right    ankle  . MASS EXCISION N/A 09/28/2015   Procedure: EXCISION OF 2 CYSTIC MASSES FROM MEDIAL STERNUM;  Surgeon: Dereck Leep, MD;  Location: [facility] ORS;  Service: Orthopedics;  Laterality: N/A;  . rheumatoid nodual removal    . TUBAL LIGATION      SOCIAL HISTORY:   Social History   Tobacco Use  . Smoking status: Former Smoker    Last attempt to quit: 09/12/2007    Years since quitting: 10.0  . Smokeless tobacco: Never Used  Substance Use Topics  . Alcohol use: Yes    Comment: 1-2 glass of wine a month.    FAMILY HISTORY:  No family history on file.  DRUG ALLERGIES:   Allergies  Allergen Reactions  . Sulfa Antibiotics Other (See Comments)    "I get real achey" Other reaction(s): Muscle Pain, Other (See Comments)  General body aches andpain.     REVIEW OF SYSTEMS:  Review of Systems  Unable to perform ROS: Intubated     MEDICATIONS AT HOME:   Prior to Admission medications   Medication Sig Start Date End Date Taking? Authorizing Provider  albuterol (PROVENTIL) (2.5 MG/3ML) 0.083% nebulizer solution Take 2.5 mg by nebulization every 6 (six) hours as needed for wheezing or shortness of breath.   Yes [provider]  atorvastatin (LIPITOR) 40 MG tablet Take 40 mg by mouth daily. 07/13/17  Yes [provider]  carboxymethylcellulose (REFRESH PLUS) 0.5 % SOLN Place 2 drops into both eyes 3 (three) times daily as needed (dry eyes).   Yes [provider]  diltiazem (CARDIZEM CD) 120 MG 24 hr capsule Take 120 mg by mouth daily. 07/13/17  Yes [provider]  donepezil (ARICEPT) 5 MG tablet Take 5 mg by mouth every evening. 08/11/17  Yes [provider]  ELIQUIS 5 MG TABS tablet Take 5 mg by mouth 2 (two) times daily. 09/02/17  Yes [provider]  ferrous sulfate 325 (65 FE) MG tablet Take 325  mg by mouth 2 (two) times daily.   Yes [provider]  gabapentin (NEURONTIN) 100 MG capsule Take 100 mg by mouth every evening. 06/29/17  Yes [provider]  glipiZIDE (GLUCOTROL XL) 5 MG 24 hr tablet Take 5 mg by mouth daily.  08/06/17  Yes [provider]  magnesium oxide (MAG-OX) 400 MG tablet Take 800 mg by mouth daily.   Yes [provider]  Melatonin 3 MG TABS Take 3 mg by mouth at bedtime as needed (sleep).   Yes [provider]  metFORMIN (GLUCOPHAGE) 500 MG tablet Take 500 mg by mouth 2 (two) times daily. 08/06/17  Yes [provider]  metoprolol tartrate (LOPRESSOR) 50 MG tablet Take 50 mg by mouth 2 (two) times daily.   Yes [provider]  oxyCODONE (OXY IR/ROXICODONE) 5 MG immediate release tablet Take 2.5 mg by mouth every 4 (four) hours as needed for severe pain.   Yes [provider]  polyethylene glycol (MIRALAX / GLYCOLAX) packet Take 17 g by mouth daily.   Yes [provider]  potassium chloride (K-DUR,KLOR-CON) 10 MEQ tablet Take 10 mEq by mouth daily.   Yes [provider]  torsemide (DEMADEX) 10 MG tablet Take 10 mg by mouth daily.   Yes [provider]  vitamin C (ASCORBIC ACID) 500 MG tablet Take 500 mg by mouth 2 (two) times daily. (take with ferrous sulfate tablets)   Yes [provider]  amoxicillin-clavulanate (AUGMENTIN) 875-125 MG tablet Take 1 tablet by mouth 2 (two) times daily. Patient not taking: Reported on 09/23/2017 08/09/17   [name], NP      VITAL SIGNS:  Blood pressure 91/63, pulse 77, temperature 98 F (36.7 C), temperature source Axillary, resp. rate 16, height 5\' 7"  (1.702 m), weight 59 kg (130 lb), SpO2 100 %.  PHYSICAL EXAMINATION:  GENERAL:  72 y.o.-year-old patient lying in the bed with no acute distress. Patient appears critically ill intubated on the ventilator EYES: Pupils equal, round, reactive to light and accommodation. No scleral  icterus. Extraocular muscles intact.  HEENT: Head atraumatic, normocephalic. Oropharynx and nasopharynx clear.  NECK:  Supple, no jugular venous distention. No thyroid enlargement, no tenderness.  LUNGS: Normal breath sounds bilaterally, no wheezing, rales,rhonchi or crepitation. No use of accessory muscles of respiration.  CARDIOVASCULAR: S1, S2 normal. No murmurs, rubs, or gallops.  ABDOMEN: Soft, nontender, nondistended. Bowel sounds present. No organomegaly or mass. Patient has surgical scar with old crusted blood over the Steri-Strips on the right upper quadrant. She has significant amount of bruising in that area. No discharge noted from the surgical site EXTREMITIES: No pedal edema, cyanosis, or clubbing.  NEUROLOGIC: patient intubated on the ventilator  PSYCHIATRIC:  on the ventilator SKIN: No obvious rash, lesion, or ulcer.   LABORATORY PANEL:   CBC Recent Labs  Lab 09/23/17 1323  WBC 6.8  HGB 8.4*  HCT 26.1*  PLT 163   ------------------------------------------------------------------------------------------------------------------  Chemistries  Recent Labs  Lab 09/21/17 1254 09/23/17 1323  NA 142 142  K 3.2* 4.8  CL 96* 96*  CO2 35* 35*  GLUCOSE 256* 313*  BUN 20 20  CREATININE 0.70 0.70  CALCIUM 8.5* 8.2*  MG 2.0  --   AST 30 39  ALT 24 24  ALKPHOS 145* 132*  BILITOT 2.2* 2.2*   ------------------------------------------------------------------------------------------------------------------  Cardiac Enzymes Recent Labs  Lab 09/23/17 1323  TROPONINI 0.05*   ------------------------------------------------------------------------------------------------------------------  RADIOLOGY:  Ct Head Wo Contrast  Result Date: 09/23/2017 CLINICAL DATA:  Patient found unresponsive yesterday. EXAM: CT HEAD WITHOUT CONTRAST TECHNIQUE: Contiguous axial images were obtained from the base of the skull through the vertex without intravenous contrast. COMPARISON:   Brain MRI 08/21/2017. FINDINGS: Brain: No evidence of acute infarction, hemorrhage, hydrocephalus, extra-axial collection or mass lesion/mass effect. Cortical atrophy and chronic microvascular ischemic change are noted. Remote lacunar infarction left caudate head is identified. Vascular: No hyperdense vessel or unexpected calcification. Skull: Intact.  No focal lesion. Sinuses/Orbits: There is fairly extensive ethmoid air cell disease and marked mucosal thickening in the left maxillary sinus. Mild mucosal thickening right sphenoid sinus is present. The left frontal sinus is opacified. Other: None. IMPRESSION: No acute abnormality. Atrophy and chronic microvascular ischemic change. Sinus disease. Electronically Signed   By: Inge Rise M.D.   On: 09/23/2017 14:30   Dg Chest Port 1 View  Result Date: 09/23/2017 CLINICAL DATA:  Tube placement. EXAM: PORTABLE CHEST 1 VIEW COMPARISON:  Radiographs of April 10, 2014. FINDINGS: Stable cardiomediastinal silhouette. Interval placement of endotracheal tube with distal tip 6 cm above the carina. No pneumothorax is noted. Mild bibasilar subsegmental atelectasis or edema is noted with small pleural effusions. Bony thorax is unremarkable. IMPRESSION: Endotracheal tube in grossly good position. Nasogastric tube is seen entering stomach. Mild bibasilar subsegmental atelectasis or edema is noted with small pleural effusions. Electronically Signed   By: Marijo Conception, M.D.   On: 09/23/2017 13:55    EKG:    IMPRESSION AND PLAN:   Steffany Schoenfelder  is a 72 y.o. female with a known history of diffuse large B cell lymphoma, uterine cancer, history of a fib on eliquis, diabetes, comes to the emergency room from [facility] where she was admitted on 614 2019. Patient is being admitted type for acute on chronic hypoxic respiratory failure secondary to suspected pneumonia/G.I. bleed/acute on chronic anemia.   1. acute on chronic hypoxic respiratory failure secondary to  possible right lower lobe pneumonia -cannot rule out PE at present. Patient is intubated on the ventilator -patient was on eliquis which was held due to drop in hemoglobin -mid to ICU. Spoke with Dr. Jamal Collin -IV vancomycin and Zosyn -MRSA PCR screening -respiratory/tracheal aspirate culture -continue propofol for sedation -pressers if needed -continue maintenance IV fluids  2. acute on chronic anemia -patient on eliquis -holding it at present -hemoglobin dropped from 10.2----8.4 -patient getting one unit of blood transfusion -surgical consultation if needed to assess right upper quadrant surgical area -consider G.I. consultation if needed  3. History of ERCP with stone removal and stent placement along with lap [place] complicated by cystic artery bleed requiring exploratory laparotomy -this was done at [facility] on 65 2019 -surgical consultation if needed  4. Diabetes sliding scale insulin for now  5. DVT prophylaxis SCD I andTed's patient has low hemoglobin hands holding eliquis for now as long as hemoglobin remains stable no evidence of G.I. bleeding  recommend resuming eliquis.  All the records are reviewed and case discussed with ED provider. Management plans discussed with the patient, family and they are in agreement.  CODE STATUS: full  TOTAL critical TIME TAKING CARE OF THIS PATIENT: 55 minutes.    Fritzi Mandes M.D on 09/23/2017 at 4:28 PM  Between 7am to 6pm - Pager - [phone]  After 6pm go to [email or web address] - password [email or web address] [facility]  [facility]  Office  [phone]  CC: Primary care physician; Derinda Late, MD

## 2017-09-23 NOTE — Progress Notes (Signed)
Family Meeting Note  Advance Directive:yes  Today a meeting took place with the husband, son, daughter the ER patient unable to participate she is intubated on the ventilator .  It is being admitted with acute respiratory failure secondary to suspected pneumonia with sepsis. She was just admitted discharge from [facility] after a complicated lap laparoscopic cholecystectomy surgery requiring exploratory laparotomy. Patient was at rehab however is quite deconditioned. He is being admitted in the ICU. Discuss code status with family members. The request full code for now.  Total Time spent 16 minutes.  Fritzi Mandes, MD

## 2017-09-23 NOTE — Progress Notes (Signed)
Pharmacy Antibiotic Note  Nina Hudson is a 72 y.o. female admitted on 09/23/2017 with pneumonia.  Pharmacy has been consulted for zosyn and vancomycin dosing. MRSA PCR negative, Marda Stalker d/c'd vancomycin   Plan: Zosyn 3.375g IV q8h (4 hour infusion).  Height: 5\' 5"  (165.1 cm) Weight: 129 lb 6.6 oz (58.7 kg) IBW/kg (Calculated) : 57  Temp (24hrs), Avg:98.8 F (37.1 C), Min:98 F (36.7 C), Max:99.8 F (37.7 C)  Recent Labs  Lab 09/21/17 1254 09/23/17 1323 09/23/17 1619 09/23/17 1943  WBC 5.6 6.8  --   --   CREATININE 0.70 0.70  --   --   LATICACIDVEN  --   --  7.5* 3.6*    Estimated Creatinine Clearance: 56.4 mL/min (by C-G formula based on SCr of 0.7 mg/dL).    Allergies  Allergen Reactions  . Sulfa Antibiotics Other (See Comments)    "I get real achey" Other reaction(s): Muscle Pain, Other (See Comments)  General body aches andpain.     Antimicrobials this admission: Anti-infectives (From admission, onward)   Start     Dose/Rate Route Frequency Ordered Stop   09/23/17 2200  piperacillin-tazobactam (ZOSYN) IVPB 3.375 g     3.375 g 12.5 mL/hr over 240 Minutes Intravenous Every 8 hours 09/23/17 1935     09/23/17 1400  vancomycin (VANCOCIN) IVPB 1000 mg/200 mL premix     1,000 mg 200 mL/hr over 60 Minutes Intravenous  Once 09/23/17 1348 09/23/17 1530   09/23/17 1400  piperacillin-tazobactam (ZOSYN) IVPB 3.375 g     3.375 g 100 mL/hr over 30 Minutes Intravenous  Once 09/23/17 1348 09/23/17 1430       Microbiology results: Recent Results (from the past 240 hour(s))  MRSA PCR Screening     Status: None   Collection Time: 09/23/17  6:17 PM  Result Value Ref Range Status   MRSA by PCR NEGATIVE NEGATIVE Final    Comment:        The GeneXpert MRSA Assay (FDA approved for NASAL specimens only), is one component of a comprehensive MRSA colonization surveillance program. It is not intended to diagnose MRSA infection nor to guide or monitor treatment  for MRSA infections. Performed at [facility], [address], [place], [place] [address]      Thank you for allowing pharmacy to be a part of this patient's care.  Donna Christen Shalimar Mcclain 09/23/2017 8:27 PM

## 2017-09-23 NOTE — Code Documentation (Signed)
Pt intubated by EDP Kinner and RT. Size 7.5, 21@lip . Positive color change noted. Sat 100% on ventilator.

## 2017-09-23 NOTE — Progress Notes (Signed)
Pt was transported from ED to CT for a head CT and back to ED while on the vent.

## 2017-09-23 NOTE — Progress Notes (Signed)
Transported pt to CT and returned to ICU 13 on vent without incident.

## 2017-09-23 NOTE — ED Triage Notes (Signed)
Pt presents to ED via [facility] from [facility] [place]. Found unresponsive by SNF staff who report pt has been fighting pna x1 week and declining hx recent cholecystectomy with complications r/t bleeding after surgery requiring another emergent surgery. Bruising noted to R upper and lower abdomen, sutures intact, hematoma under sutures. No obvious bleeding at this time.

## 2017-09-23 NOTE — Consult Note (Addendum)
PULMONARY / CRITICAL CARE MEDICINE   Name: Nina Hudson MRN: [id number] DOB: 29-Jan-1946    ADMISSION DATE:  09/23/2017 CONSULTATION DATE: 09/23/2017  REFERRING MD: Dr. Posey Pronto   CHIEF COMPLAINT: Unresponsiveness   HISTORY OF PRESENT ILLNESS:   This is a 72 yo female with a PMH of Stroke, HTN, Rheumatoid Arthritis,  Hypercholesteremia, Dysrhythmia, Diabetes Mellitus, Uterine Cancer, Diffuse Large B Cell Lymphoma, Atrial Fibrillation (previously on eliquis held due to recent surgery), Chronic Diastolic CHF (EF 55 to 85% on 09/08/17) , Depression, and Arthritis.  She presented to [facility] ER on 06/19 via EMS from the [place] after being found minimally responsive with rapid respirations and increased heart rate. The nurse practitioner at the [place] spoke with pts husband regarding change in pt condition, and her concern of possible pneumonia.  She discussed plan of care options with pts husband and he decided he wanted pt transported to [facility] for aggressive treatment.  She recently had a complicated hospitalization at [facility] from 05/14/7410-87/86/7672 for complicated cholecystitis with cholecystectomy. She underwent an ERCP with metal stent placement and stone extraction with lap chole.  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 She was discharged from [facility] on 09/18/17 to the [facility] for rehab.  According to pts daughter since discharge from [facility] she has had poor po intake, c/o weakness/fatigue, and unable to participate in rehab. Upon arrival to [facility] ER she was hypoxic O2 sats 80's on 4L O2 with agonal respirations requiring mechanical intubation.  Pt found to be guaiac positive with black stool with hgb 8.4, therefore she received 1 unit of pRBC's.  CXR concerning for pneumonia vs.  pulmonary edema and lactic acid 7.5, she received 1L fluid bolus and iv abx.  She was subsequently admitted to ICU by hospitalist team for further workup and treatment.     PAST MEDICAL HISTORY :  She  has a past medical history of Arthritis, Cancer ([place]), Cancer ([place]), Depression, Diabetes mellitus without complication ([place]), Dysrhythmia, Hypercholesteremia, Hypertension, and Stroke ([place]).  PAST SURGICAL HISTORY: She  has a past surgical history that includes Bunionectomy (Bilateral); Appendectomy; Abdominal hysterectomy; Fracture surgery (Right); Tubal ligation; rheumatoid nodual removal; and Mass excision (N/A, 09/28/2015).  Allergies  Allergen Reactions  . Sulfa Antibiotics Other (See Comments)    "I get real achey" Other reaction(s): Muscle Pain, Other (See Comments)  General body aches andpain.     No current facility-administered medications on file prior to encounter.    Current Outpatient Medications on File Prior to Encounter  Medication Sig  . albuterol (PROVENTIL) (2.5 MG/3ML) 0.083% nebulizer solution Take 2.5 mg by nebulization every 6 (six) hours as needed for wheezing or shortness of breath.  Marland Kitchen atorvastatin (LIPITOR) 40 MG tablet Take 40 mg by mouth daily.  . carboxymethylcellulose (REFRESH PLUS) 0.5 % SOLN Place 2 drops into both eyes 3 (three) times daily as needed (dry eyes).  Marland Kitchen diltiazem (CARDIZEM CD) 120 MG 24 hr capsule Take 120 mg by mouth daily.  Marland Kitchen donepezil (ARICEPT) 5 MG tablet Take 5 mg by mouth every evening.  Marland Kitchen ELIQUIS 5 MG TABS tablet Take 5 mg by mouth 2 (two) times daily.  . ferrous sulfate 325 (65 FE) MG tablet Take 325 mg by mouth 2 (two) times daily.  Marland Kitchen gabapentin (NEURONTIN) 100 MG capsule  Take 100 mg by mouth every evening.  Marland Kitchen glipiZIDE (GLUCOTROL XL) 5 MG 24 hr tablet Take 5 mg by mouth daily.   . magnesium oxide (MAG-OX) 400 MG tablet Take 800 mg by mouth daily.  . Melatonin 3 MG TABS Take 3 mg by mouth at bedtime as needed (sleep).  . metFORMIN  (GLUCOPHAGE) 500 MG tablet Take 500 mg by mouth 2 (two) times daily.  . metoprolol tartrate (LOPRESSOR) 50 MG tablet Take 50 mg by mouth 2 (two) times daily.  Marland Kitchen oxyCODONE (OXY IR/ROXICODONE) 5 MG immediate release tablet Take 2.5 mg by mouth every 4 (four) hours as needed for severe pain.  . polyethylene glycol (MIRALAX / GLYCOLAX) packet Take 17 g by mouth daily.  . potassium chloride (K-DUR,KLOR-CON) 10 MEQ tablet Take 10 mEq by mouth daily.  Marland Kitchen torsemide (DEMADEX) 10 MG tablet Take 10 mg by mouth daily.  . vitamin C (ASCORBIC ACID) 500 MG tablet Take 500 mg by mouth 2 (two) times daily. (take with ferrous sulfate tablets)  . amoxicillin-clavulanate (AUGMENTIN) 875-125 MG tablet Take 1 tablet by mouth 2 (two) times daily. (Patient not taking: Reported on 09/23/2017)    FAMILY HISTORY:  Her has no family status information on file.    SOCIAL HISTORY: She  reports that she quit smoking about 10 years ago. She has never used smokeless tobacco. She reports that she drinks alcohol. She reports that she does not use drugs.  REVIEW OF SYSTEMS:   Unable to assess pt intubated  SUBJECTIVE:  Unable to assess pt intubated   VITAL SIGNS: BP 95/63   Pulse 82   Temp 99.8 F (37.7 C) (Oral)   Resp 15   Ht 5' 5" (1.651 m)   Wt 58.7 kg (129 lb 6.6 oz)   SpO2 100%   BMI 21.53 kg/m   HEMODYNAMICS:    VENTILATOR SETTINGS: Vent Mode: PRVC FiO2 (%):  [40 %-100 %] 40 % Set Rate:  [15 bmp] 15 bmp Vt Set:  [450 mL-459 mL] 459 mL PEEP:  [5 cmH20] 5 cmH20  INTAKE / OUTPUT: I/O last 3 completed shifts: In: 2580 [I.V.:2000; Blood:330; IV Piggyback:250] Out: -   PHYSICAL EXAMINATION: General: acutely ill appearing female, NAD mechanically intubated  Neuro: sedated, withdraws from painful stimulation, PERRL  HEENT: supple, no JVD Cardiovascular: irregular irregular, no R/G Lungs: faint rhonchi throughout, even, non labored  Abdomen: RUQ incision site with steri strips in place with dried  blood and ecchymosis with small hematoma, +BS x4, soft, non distended, tender  Musculoskeletal: 2+ bilateral lower extremity edema Skin: RUQ incision site   LABS:  BMET Recent Labs  Lab 09/21/17 1254 09/23/17 1323  NA 142 142  K 3.2* 4.8  CL 96* 96*  CO2 35* 35*  BUN 20 20  CREATININE 0.70 0.70  GLUCOSE 256* 313*    Electrolytes Recent Labs  Lab 09/21/17 1254 09/23/17 1323  CALCIUM 8.5* 8.2*  MG 2.0  --     CBC Recent Labs  Lab 09/21/17 1254 09/23/17 1323  WBC 5.6 6.8  HGB 9.0* 8.4*  HCT 27.8* 26.1*  PLT 141* 163    Coag's Recent Labs  Lab 09/23/17 1323  APTT 32  INR 2.50    Sepsis Markers Recent Labs  Lab 09/23/17 1619  LATICACIDVEN 7.5*    ABG Recent Labs  Lab 09/23/17 1321  PHART 7.49*  PCO2ART 48  PO2ART 352*    Liver Enzymes Recent Labs  Lab 09/21/17 1254 09/23/17 1323  AST 30  39  ALT 24 24  ALKPHOS 145* 132*  BILITOT 2.2* 2.2*  ALBUMIN 2.8* 2.9*    Cardiac Enzymes Recent Labs  Lab 09/23/17 1323  TROPONINI 0.05*    Glucose Recent Labs  Lab 09/23/17 1812  GLUCAP 286*    Imaging Ct Head Wo Contrast  Result Date: 09/23/2017 CLINICAL DATA:  Patient found unresponsive yesterday. EXAM: CT HEAD WITHOUT CONTRAST TECHNIQUE: Contiguous axial images were obtained from the base of the skull through the vertex without intravenous contrast. COMPARISON:  Brain MRI 08/21/2017. FINDINGS: Brain: No evidence of acute infarction, hemorrhage, hydrocephalus, extra-axial collection or mass lesion/mass effect. Cortical atrophy and chronic microvascular ischemic change are noted. Remote lacunar infarction left caudate head is identified. Vascular: No hyperdense vessel or unexpected calcification. Skull: Intact.  No focal lesion. Sinuses/Orbits: There is fairly extensive ethmoid air cell disease and marked mucosal thickening in the left maxillary sinus. Mild mucosal thickening right sphenoid sinus is present. The left frontal sinus is opacified.  Other: None. IMPRESSION: No acute abnormality. Atrophy and chronic microvascular ischemic change. Sinus disease. Electronically Signed   By: Inge Rise M.D.   On: 09/23/2017 14:30   Dg Chest Port 1 View  Result Date: 09/23/2017 CLINICAL DATA:  Tube placement. EXAM: PORTABLE CHEST 1 VIEW COMPARISON:  Radiographs of April 10, 2014. FINDINGS: Stable cardiomediastinal silhouette. Interval placement of endotracheal tube with distal tip 6 cm above the carina. No pneumothorax is noted. Mild bibasilar subsegmental atelectasis or edema is noted with small pleural effusions. Bony thorax is unremarkable. IMPRESSION: Endotracheal tube in grossly good position. Nasogastric tube is seen entering stomach. Mild bibasilar subsegmental atelectasis or edema is noted with small pleural effusions. Electronically Signed   By: Marijo Conception, M.D.   On: 09/23/2017 13:55   STUDIES/SIGNIFICANT EVENTS: 06/19 CT Head revealed no acute abnormality. Atrophy and chronic microvascular ischemic change. Sinus disease.  CULTURES: Blood x2 06/19>> Respiratory 06/19>> Urine 06/19>>  ANTIBIOTICS: Zosyn 06/19>> Vancomycin 06/19 x1 dose   LINES/TUBES: ETT 06/19>>  ASSESSMENT / PLAN:  PULMONARY A: Acute hypoxic respiratory failure secondary to pneumonia vs. pulmonary edema  Mechanical Intubation  P:   Full vent support-vent settings reviewed and established  SBT once all parameters met  Prn bronchodilator therapy  VAP bundle implemented  CTA Chest to r/o PE   CARDIOVASCULAR A:  Elevated troponin secondary to NSTEMI vs. demand ischemia  Intermittent Vfibb/Vtach  Chronic Atrial Fibrillation  P:  Continuous telemetry monitoring  Trend troponin's Maintain map >65 Will start amiodarone gtt   RENAL A:   Lactic acidosis  P:   Trend BMP and lactic acid  Replace electrolytes as indicated  Monitor UOP   GASTROINTESTINAL A:   Gastrointestinal Bleeding  Hx: Cholecystitis s/p ERCP with stone removal and  metal stent placement along with lap chole complicated by hemoperitoneum secondary to cystic artery bleed requiring ligation-09/09/17 P:   Keep NPO for now  NG to LIS  SUP px: IV protonix   HEMATOLOGIC A:   Acute blood loss anemia  P:  Serial H&H Monitor for s/sx of bleeding  Transfuse for hgb <7 and/or active bleeding  VTE px: SCD's, holding outpatient eliquis  If she shows active signs of bleeding will consult GI and order GI Bleeding Scan  INFECTIOUS A:   Pneumonia  P:   Trend WBC and monitor fever curve Trend PCT Follow cultures Continue abx as listed above  ENDOCRINE A:   Diabetes Mellitus  P:   CBG's q4hrs  SSI   NEUROLOGIC  A:   Acute encephalopathy likely secondary to hypoxia  Mechanical Intubation Discomfort/Pain  P:   RASS goal: 0 to -1 Prn fentanyl gtt to maintain RASS goal  WUA daily    FAMILY  - Updates: Spoke with pts daughter regarding place of care and all questions answered  - Will consult Palliative Care.  Toni Arthurs the NP at [facility] has started having conversations with pts husband regarding goals of treatment and code status. Pts husband instructed to bring living will when he arrives at the hospital in the am.    Marda Stalker, [place] Pager [phone] (please enter 7 digits) [place] Pager [phone] (please enter 7 digits)

## 2017-09-23 NOTE — ED Notes (Signed)
ED Provider at bedside speaking to family

## 2017-09-23 NOTE — Progress Notes (Signed)
Location:      Place of Service:  SNF (31) Provider:  Toni Arthurs, NP-C  Derinda Late, MD  Patient Care Team: Derinda Late, MD as PCP - General [facility] Medicine)  Extended Emergency Contact Information Primary Emergency Contact: [facility] Address: [address] [place], [place] [address] Johnnette Litter of [place] Phone: [phone] Mobile Phone: [phone] Relation: Spouse Secondary Emergency Contact: Sheppard [place] of [place] Phone: [phone] Relation: Daughter  Code Status:  FULL CODE Goals of care: Advanced Directive information Advanced Directives 09/28/2015  Does Patient Have a Medical Advance Directive? No  Does patient want to make changes to medical advance directive? -  Copy of [place] in Chart? -     Chief Complaint  Patient presents with  . Acute Visit    PNA    HPI:  Pt is a 73 y.o. female seen today for an acute visit for Altered mental status and Pneumonia. Pt was admitted to the facility for rehab following hospitalization at [facility] for Complicated Cholecystitis with Cholecystectomy.  Discharge diagnoses from [facility] include hemoperitoneum secondary to cystic artery stump bleed and calculus of gallbladder with acute cholecystitis without obstruction.  Patient underwent ERCP with placement of stent through exploratory laparotomy, exploratory celiotomy.  Patient has an active history of hypertension, A. fib with RVR.  History of CVA, chronic diastolic CHF and type 2 diabetes.  Patient does have a small abdominal incision, covered with dry gauze dressing. ++ Bruising to the abdomen.++ Bruising to the right upper chest/neck at location of central line, removed prior to admission to facility.  Today, I was called urgently to the room by nursing. . Patient appeared to be less responsive than yesterday, with rapid respirations, increased heart rate.  2 view chest x-ray was obtained this morning, showing  possible bilateral pneumonia and/or CHF.  Upon assessment, patient is minimally responsive to name call.  Not responsive to tactile stimulation.  Will answer 1 or 2 very simple yes/no questions before returning to sleep.  Patient appears pale, skin is cool to touch in general.  Respirations are labored with faint, generalized rhonchi and audible secretions.  Heart rate is tachy.  Bilateral plantar surface of feet have mottling.  Feet and lower legs are cold to touch.  Fingers with faint cyanosis.  Patient is a full code.  I contacted patient's husband.  Lengthy amount of time spent explaining the difference between full code and no code, code procedures and potential outcomes.  Husband seem to have a better understanding of differences in [place] after the conversation.  Gave husband the option of sending to the emergency room versus patient staying in facility, making her a DNR, and attempting to treat pneumonia versus comfort care.  Husband was unsure as to what to do, but did say he wanted to give her every chance possible.  Has been agreeable to sending to the emergency room.  I offered to call the son for an update.  Husband declined, said he would notify the children.  Husband grateful for the frank conversation.  Patient was scheduled to have a palliative care consult while in the facility for rehab.  Consult has not happened yet.  Would like to request patient received palliative care consult while in the hospital for discussions of goals of care and frank discussions regarding patient's prognosis.  EMS notified.  Sending patient to [facility] ED.    Past Medical History:  Diagnosis Date  . Arthritis   . Cancer (H[facility])    diffuse large B cell lymphoma  . Cancer ([place])    uterine  . Depression   . Diabetes mellitus without complication ([place])   . Dysrhythmia    A-fib  . Hypercholesteremia   . Hypertension   . Stroke [facility])    Past Surgical History:  Procedure Laterality Date  . ABDOMINAL  HYSTERECTOMY    . APPENDECTOMY    . BUNIONECTOMY Bilateral   . FRACTURE SURGERY Right    ankle  . MASS EXCISION N/A 09/28/2015   Procedure: EXCISION OF 2 CYSTIC MASSES FROM MEDIAL STERNUM;  Surgeon: Dereck Leep, MD;  Location: [facility] ORS;  Service: Orthopedics;  Laterality: N/A;  . rheumatoid nodual removal    . TUBAL LIGATION      Allergies  Allergen Reactions  . Sulfa Antibiotics Other (See Comments)    "I get real achey" Other reaction(s): Muscle Pain, Other (See Comments)  General body aches andpain.     Allergies as of 09/23/2017      Reactions   Sulfa Antibiotics Other (See Comments)   "I get real achey" Other reaction(s): Muscle Pain, Other (See Comments) General body aches andpain.      Medication List        Accurate as of 09/23/17 12:44 PM. Always use your most recent med list.          amoxicillin-clavulanate 875-125 MG tablet Commonly known as:  AUGMENTIN Take 1 tablet by mouth 2 (two) times daily.   aspirin 81 MG tablet Take 81 mg by mouth daily.   HYDROcodone-acetaminophen 5-325 MG tablet Commonly known as:  NORCO Take 1-2 tablets by mouth every 4 (four) hours as needed for moderate pain.   metFORMIN 1000 MG tablet Commonly known as:  GLUCOPHAGE Take 1,000 mg by mouth 2 (two) times daily with a meal.   metoprolol tartrate 25 MG tablet Commonly known as:  LOPRESSOR Take 50-75 mg by mouth 2 (two) times daily. 3 tablets in the am and 2 tablets at night.   mirtazapine 15 MG tablet Commonly known as:  REMERON Take 15 mg by mouth at bedtime.       Review of Systems  Unable to perform ROS: Mental status change  Constitutional: Positive for activity change, diaphoresis and fatigue. Negative for chills and fever.  HENT: Negative for congestion and drooling.   Respiratory: Positive for shortness of breath. Negative for apnea, cough, choking, chest tightness, wheezing and stridor.   Cardiovascular: Negative for chest pain and leg swelling.    Gastrointestinal: Negative.   Endocrine: Negative for heat intolerance.  Genitourinary: Negative.   Musculoskeletal: Negative.   Skin: Positive for pallor.  Neurological: Positive for weakness.     There is no immunization history on file for this patient. There are no preventive care reminders to display for this patient. No flowsheet data found. Functional Status Survey:    Vitals:   09/23/17 1215  BP: 132/80  Pulse: (!) 110  Resp: (!) 26  Temp: 98.9 F (37.2 C)  SpO2: 95%   There is no height or weight on file to calculate BMI. Physical Exam  Constitutional: She appears well-developed and well-nourished. She is active and cooperative. She has a sickly appearance. She does not appear ill. No distress. Nasal cannula in place.  O2 2L [place]  HENT:  Head: Normocephalic and atraumatic.  Mouth/Throat: Uvula is midline and oropharynx is clear and moist. Mucous membranes are pale, not dry  and not cyanotic.  Eyes: Pupils are equal, round, and reactive to light. Conjunctivae, EOM and lids are normal.  Neck: Trachea normal, normal range of motion and full passive range of motion without pain. Neck supple. No JVD present. No tracheal deviation, no edema and no erythema present. No thyromegaly present.  Cardiovascular: Regular rhythm, normal heart sounds, intact distal pulses and normal pulses. Tachycardia present. Exam reveals no gallop, no distant heart sounds and no friction rub.  No murmur heard. Pulses:      Dorsalis pedis pulses are 2+ on the right side, and 2+ on the left side.  No edema  Pulmonary/Chest: No accessory muscle usage. Tachypnea noted. She is in respiratory distress. She has no decreased breath sounds. She has no wheezes. She has rhonchi in the right upper field, the right middle field, the right lower field, the left upper field, the left middle field and the left lower field. She has no rales. She exhibits no tenderness.  Secretions/ "rattle"  Abdominal: Soft. Normal  appearance. She exhibits no distension and no ascites. Bowel sounds are decreased. There is no tenderness.  Musculoskeletal: Normal range of motion. She exhibits no edema or tenderness.  Expected osteoarthritis, stiffness; Bilateral Calves soft, supple. Negative Homan's Sign. B- pedal pulses equal; generalized weakness  Neurological: She is unresponsive.  Minimally responsive to name call only  Skin: Skin is warm, dry and intact. She is not diaphoretic. There is cyanosis. There is pallor. Nails show no clubbing.  Skin cool to touch, feet cold, mottling. Fingers with faint cyanosis  Psychiatric: She is inattentive.  Nursing note and vitals reviewed.   Labs reviewed: Recent Labs    09/24/16 1533 09/21/17 1254  NA  --  142  K  --  3.2*  CL  --  96*  CO2  --  35*  GLUCOSE  --  256*  BUN  --  20  CREATININE 1.10* 0.70  CALCIUM  --  8.5*  MG  --  2.0   Recent Labs    09/21/17 1254  AST 30  ALT 24  ALKPHOS 145*  BILITOT 2.2*  PROT 5.8*  ALBUMIN 2.8*   Recent Labs    09/21/17 1254  WBC 5.6  NEUTROABS 3.9  HGB 9.0*  HCT 27.8*  MCV 94.7  PLT 141*   Lab Results  Component Value Date   TSH 3.450 09/21/2017   No results found for: HGBA1C Lab Results  Component Value Date   CHOL 160 07/14/2012   HDL 40 07/14/2012   LDLCALC 91 07/14/2012   TRIG 143 07/14/2012    Significant Diagnostic Results in last 30 days:  No results found.    Assessment/Plan Terrilee was seen today for acute visit.  Diagnoses and all orders for this visit:  Pneumonia of both lower lobes due to infectious organism [facility])  altered mental status    Send to the ED for eval and treat  Discussed requesting Palliative Care Consult in the ED with the husband  Family/ staff Communication:   Total Time: 35 minutes  Documentation:  Face to Face: 20 minutes  Family/Phone: Husband - 15 minutes   Labs/tests ordered:  2- V CXR  Medication list reviewed and assessed for continued  appropriateness.  Vikki Ports, NP-C Geriatrics [facility] [address] [place], [place] [address] Cell Phone (Mon-Fri 8am-5pm):  [phone] On Call:  [phone] & follow prompts after 5pm & weekends Office Phone:  [phone] Office Fax:  [phone]

## 2017-09-24 ENCOUNTER — Inpatient Hospital Stay: Payer: Medicare Other

## 2017-09-24 DIAGNOSIS — Z7189 Other specified counseling: Secondary | ICD-10-CM

## 2017-09-24 DIAGNOSIS — R4182 Altered mental status, unspecified: Secondary | ICD-10-CM

## 2017-09-24 DIAGNOSIS — J181 Lobar pneumonia, unspecified organism: Secondary | ICD-10-CM

## 2017-09-24 DIAGNOSIS — Z515 Encounter for palliative care: Secondary | ICD-10-CM

## 2017-09-24 DIAGNOSIS — H6593 Unspecified nonsuppurative otitis media, bilateral: Secondary | ICD-10-CM

## 2017-09-24 DIAGNOSIS — G9341 Metabolic encephalopathy: Secondary | ICD-10-CM

## 2017-09-24 DIAGNOSIS — J9601 Acute respiratory failure with hypoxia: Secondary | ICD-10-CM

## 2017-09-24 DIAGNOSIS — I482 Chronic atrial fibrillation: Secondary | ICD-10-CM

## 2017-09-24 LAB — CBC
HCT: 29 % — ABNORMAL LOW (ref 35.0–47.0)
Hemoglobin: 9.7 g/dL — ABNORMAL LOW (ref 12.0–16.0)
MCH: 30.8 pg (ref 26.0–34.0)
MCHC: 33.4 g/dL (ref 32.0–36.0)
MCV: 92.1 fL (ref 80.0–100.0)
PLATELETS: 154 10*3/uL (ref 150–440)
RBC: 3.15 MIL/uL — ABNORMAL LOW (ref 3.80–5.20)
RDW: 20.8 % — AB (ref 11.5–14.5)
WBC: 6.6 10*3/uL (ref 3.6–11.0)

## 2017-09-24 LAB — BASIC METABOLIC PANEL
ANION GAP: 8 (ref 5–15)
Anion gap: 9 (ref 5–15)
BUN: 21 mg/dL — AB (ref 6–20)
BUN: 19 mg/dL (ref 6–20)
CO2: 33 mmol/L — ABNORMAL HIGH (ref 22–32)
CO2: 34 mmol/L — AB (ref 22–32)
Calcium: 8 mg/dL — ABNORMAL LOW (ref 8.9–10.3)
Calcium: 7.8 mg/dL — ABNORMAL LOW (ref 8.9–10.3)
Chloride: 100 mmol/L — ABNORMAL LOW (ref 101–111)
Chloride: 101 mmol/L (ref 101–111)
Creatinine, Ser: 0.71 mg/dL (ref 0.44–1.00)
Creatinine, Ser: 0.78 mg/dL (ref 0.44–1.00)
GFR calc Af Amer: >60 mL/min (ref >60)
GFR calc Af Amer: >60 mL/min (ref >60)
GLUCOSE: 175 mg/dL — AB (ref 65–99)
Glucose, Bld: 285 mg/dL — ABNORMAL HIGH (ref 65–99)
POTASSIUM: 3.1 mmol/L — AB (ref 3.5–5.1)
POTASSIUM: 3.2 mmol/L — AB (ref 3.5–5.1)
Sodium: 142 mmol/L (ref 135–145)
Sodium: 143 mmol/L (ref 135–145)

## 2017-09-24 LAB — GLUCOSE, CAPILLARY
GLUCOSE-CAPILLARY: 117 mg/dL — AB (ref 65–99)
GLUCOSE-CAPILLARY: 123 mg/dL — AB (ref 65–99)
GLUCOSE-CAPILLARY: 192 mg/dL — AB (ref 65–99)
GLUCOSE-CAPILLARY: 247 mg/dL — AB (ref 65–99)
GLUCOSE-CAPILLARY: 270 mg/dL — AB (ref 65–99)
GLUCOSE-CAPILLARY: 99 mg/dL (ref 65–99)
Glucose-Capillary: 224 mg/dL — ABNORMAL HIGH (ref 65–99)
Glucose-Capillary: 186 mg/dL — ABNORMAL HIGH (ref 65–99)
Glucose-Capillary: 138 mg/dL — ABNORMAL HIGH (ref 65–99)
Glucose-Capillary: 68 mg/dL (ref 65–99)
Glucose-Capillary: 147 mg/dL — ABNORMAL HIGH (ref 65–99)
Glucose-Capillary: 172 mg/dL — ABNORMAL HIGH (ref 65–99)
Glucose-Capillary: 144 mg/dL — ABNORMAL HIGH (ref 65–99)

## 2017-09-24 LAB — BPAM RBC
BLOOD PRODUCT EXPIRATION DATE: 201906262359
ISSUE DATE / TIME: 201906191505
UNIT TYPE AND RH: 9500

## 2017-09-24 LAB — TYPE AND SCREEN
ABO/RH(D): O NEG
ANTIBODY SCREEN: NEGATIVE
Unit division: 0

## 2017-09-24 LAB — HEMOGLOBIN AND HEMATOCRIT, BLOOD
HCT: 29.8 % — ABNORMAL LOW (ref 35.0–47.0)
HEMATOCRIT: 29 % — AB (ref 35.0–47.0)
HEMATOCRIT: 33.6 % — AB (ref 35.0–47.0)
HEMOGLOBIN: 11.5 g/dL — AB (ref 12.0–16.0)
Hemoglobin: 9.4 g/dL — ABNORMAL LOW (ref 12.0–16.0)
Hemoglobin: 9.9 g/dL — ABNORMAL LOW (ref 12.0–16.0)

## 2017-09-24 LAB — LACTIC ACID, PLASMA
LACTIC ACID, VENOUS: 2.6 mmol/L — AB (ref 0.5–1.9)
Lactic Acid, Venous: 4 mmol/L (ref 0.5–1.9)
Lactic Acid, Venous: 2.6 mmol/L (ref 0.5–1.9)

## 2017-09-24 LAB — URINE CULTURE: Culture: NO GROWTH

## 2017-09-24 LAB — LIPASE, BLOOD: Lipase: 45 U/L (ref 11–51)

## 2017-09-24 LAB — PHOSPHORUS: Phosphorus: 1.6 mg/dL — ABNORMAL LOW (ref 2.5–4.6)

## 2017-09-24 LAB — ABO/RH: ABO/RH(D): O NEG

## 2017-09-24 LAB — MAGNESIUM: Magnesium: 1.8 mg/dL (ref 1.7–2.4)

## 2017-09-24 MED ORDER — POTASSIUM CHLORIDE 10 MEQ/100ML IV SOLN
10.0000 meq | INTRAVENOUS | Status: DC
  Administered 2017-09-24: 10 meq via INTRAVENOUS
  Filled 2017-09-24 (×4): qty 100

## 2017-09-24 MED ORDER — IOPAMIDOL (ISOVUE-300) INJECTION 61%
15.0000 mL | INTRAVENOUS | Status: AC
Start: 2017-09-24 — End: 2017-09-24
  Administered 2017-09-24 (×2): 15 mL via ORAL

## 2017-09-24 MED ORDER — DEXTROSE 50 % IV SOLN
INTRAVENOUS | Status: AC
  Administered 2017-09-24: 50 mL via INTRAVENOUS
  Filled 2017-09-24: qty 50

## 2017-09-24 MED ORDER — VITAL 1.5 CAL PO LIQD
1000.0000 mL | ORAL | Status: DC
  Administered 2017-09-24: 1000 mL

## 2017-09-24 MED ORDER — INSULIN ASPART 100 UNIT/ML ~~LOC~~ SOLN
0.0000 [IU] | SUBCUTANEOUS | Status: DC
  Administered 2017-09-24: 2 [IU] via SUBCUTANEOUS
  Administered 2017-09-24: 3 [IU] via SUBCUTANEOUS
  Administered 2017-09-24: 2 [IU] via SUBCUTANEOUS
  Administered 2017-09-25 (×5): 3 [IU] via SUBCUTANEOUS
  Administered 2017-09-26 (×2): 5 [IU] via SUBCUTANEOUS
  Administered 2017-09-26: 3 [IU] via SUBCUTANEOUS
  Administered 2017-09-26 (×2): 5 [IU] via SUBCUTANEOUS
  Administered 2017-09-26 – 2017-09-27 (×3): 3 [IU] via SUBCUTANEOUS
  Administered 2017-09-27: 2 [IU] via SUBCUTANEOUS
  Administered 2017-09-27 – 2017-09-28 (×4): 5 [IU] via SUBCUTANEOUS
  Administered 2017-09-28 (×3): 3 [IU] via SUBCUTANEOUS
  Administered 2017-09-28 – 2017-09-29 (×2): 5 [IU] via SUBCUTANEOUS
  Administered 2017-09-29: 2 [IU] via SUBCUTANEOUS
  Administered 2017-09-29: 8 [IU] via SUBCUTANEOUS
  Administered 2017-09-29: 5 [IU] via SUBCUTANEOUS
  Administered 2017-09-29 (×2): 8 [IU] via SUBCUTANEOUS
  Administered 2017-09-30: 2 [IU] via SUBCUTANEOUS
  Administered 2017-09-30 – 2017-10-01 (×2): 3 [IU] via SUBCUTANEOUS
  Administered 2017-10-01 (×2): 2 [IU] via SUBCUTANEOUS
  Administered 2017-10-01: 5 [IU] via SUBCUTANEOUS
  Administered 2017-10-01: 3 [IU] via SUBCUTANEOUS
  Administered 2017-10-02: 2 [IU] via SUBCUTANEOUS
  Administered 2017-10-02 (×2): 3 [IU] via SUBCUTANEOUS
  Administered 2017-10-02 (×2): 5 [IU] via SUBCUTANEOUS
  Administered 2017-10-02: 3 [IU] via SUBCUTANEOUS
  Filled 2017-09-24 (×43): qty 1

## 2017-09-24 MED ORDER — POTASSIUM PHOSPHATES 15 MMOLE/5ML IV SOLN
30.0000 mmol | Freq: Once | INTRAVENOUS | Status: AC
  Administered 2017-09-24: 30 mmol via INTRAVENOUS
  Filled 2017-09-24: qty 10

## 2017-09-24 MED ORDER — AMIODARONE IV BOLUS ONLY 150 MG/100ML
150.0000 mg | Freq: Once | INTRAVENOUS | Status: AC
  Administered 2017-09-25: 150 mg via INTRAVENOUS

## 2017-09-24 MED ORDER — DEXTROSE 50 % IV SOLN
1.0000 | Freq: Once | INTRAVENOUS | Status: AC
  Administered 2017-09-24: 50 mL via INTRAVENOUS

## 2017-09-24 NOTE — Progress Notes (Signed)
   09/24/17 1325  Clinical Encounter Type  Visited With Patient and family together  Visit Type Initial  Spiritual Encounters  Spiritual Needs Prayer;Emotional   Chaplain checked in with patient with whom care team was working.  Patient would nod when family asked questions, but kept eyes closed.  Patient's daughter and patient's sister spoke of events leading to patient's hospitalization.  Daughter spoke of patient's dog [place] who is a primary support for patient.  Family requested prayer and patient nodded that she would like prayer.  Family voiced that prayers should be for strength and healing.  Chaplain offered prayer for patient, care team, and family.  Chaplain reviewed support availability and encouraged family to have staff page chaplain as needed.

## 2017-09-24 NOTE — Consult Note (Signed)
Consultation Note Date: 09/24/2017   Patient Name: Nina Hudson  DOB: [birth year]  MRN: [id number]  Age / Sex: 72 y.o., female  PCP: Derinda Late, MD Referring Physician: Dustin Flock, MD  Reason for Consultation: Establishing goals of care  HPI/Patient Profile: 72 y.o. female  with past medical history of HTN, HLD, afib RVR (previously on eliquis, held d/t recent surgery and bleed), CVA, diastolic CHF (EF 77-93%), depression, T2DM, diffuse large B cell lymphoma, uterine CA, RA admitted on 09/23/2017 with AMS and pna. Per NP note from facility, patient was found minimally responsive with tachypnea and tachycardia, mottled legs. CXR at facility revealed pna vs edema. Husband was called and he wanted full code and transfer to hospital. Patient was supposed to be seen by palliative care at the facility but the visit had not been completed yet. Patient was recently hospitalized at [facility] 9/0-3/00 for complicated cholecystitis with cholecystectomy. She underwent an ERCP with stent placement and stone extraction with lap chole.  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 She was discharged from [facility] on 09/18/17 to the [facility] for rehab. Since discharge from [facility] she has been weak and fatigued with minimal PO intake. Upon arrival to [facility] pt required intubation d/t agonal respirations. Found to be guaiac positive and hgb 8.4 down from 10.2. Received 1 unit PRBCs. Lactic acid 7.5. Per CCM, patient will have abd scan later today. PMT consulted for [place].   Clinical Assessment and Goals of Care: I have reviewed medical records including EPIC notes, labs and imaging, received report from RN and Dr. Celesta Aver, assessed the patient and then met at the bedside along with  patient's niece and daughter, Kennyth Lose,  to discuss diagnosis prognosis, GOC, EOL wishes, disposition and options.  I introduced Palliative Medicine as specialized medical care for people living with serious illness. It focuses on providing relief from the symptoms and stress of a serious illness. The goal is to improve quality of life for both the patient and the family.  Patient has a spouse, son, and daughter. Patient's spouse is also ill. He is recovering from heart surgery and sepsis. Patient's son lives in [place].  Patient's daughter asks me what would've happened if patient would have remained at the facility. I explained the severity of her illness and high probability that she would have passed away due to respiratory failure.   Kennyth Lose tells me the patient has a living will and she would like to review it with me when her dad arrives. Kennyth Lose would like to wait until her dad is present to discuss [place] further. She shares her concerns about where patient will go after discharge.   I attempted to meet with the patient's space but he was unable to make it to the hospital today. We will attempt to meet tomorrow.   Questions and concerns were addressed. The family was encouraged to call with questions or concerns.   Primary Decision Maker NEXT OF KIN - spouse    SUMMARY OF RECOMMENDATIONS   - will attempt to meet with patient's spouse and daughter tomorrow - will review living will with them  - will address code status during this meeting  Code Status/Advance Care Planning:  Full code   Symptom Management:   Per primary, currently comfortable on fentanyl drip  Prognosis:   Unable  to determine  Discharge Planning: To Be Determined      Primary Diagnoses: Present on Admission: . Acute on chronic respiratory failure with hypoxia ([place])   I have reviewed the medical record, interviewed the patient and family, and examined the patient. The following aspects are  pertinent.  Past Medical History:  Diagnosis Date  . Arthritis   . Cancer (HCC)    diffuse large B cell lymphoma  . Cancer ([place])    uterine  . Depression   . Diabetes mellitus without complication ([place])   . Dysrhythmia    A-fib  . Hypercholesteremia   . Hypertension   . Stroke [facility])    Social History   Socioeconomic History  . Marital status: Married    Spouse name: Not on file  . Number of children: Not on file  . Years of education: Not on file  . Highest education level: Not on file  Occupational History  . Not on file  Social Needs  . Financial resource strain: Not on file  . Food insecurity:    Worry: Not on file    Inability: Not on file  . Transportation needs:    Medical: Not on file    Non-medical: Not on file  Tobacco Use  . Smoking status: Former Smoker    Last attempt to quit: 09/12/2007    Years since quitting: 10.0  . Smokeless tobacco: Never Used  Substance and Sexual Activity  . Alcohol use: Yes    Comment: 1-2 glass of wine a month.  . Drug use: No  . Sexual activity: Not Currently  Lifestyle  . Physical activity:    Days per week: Not on file    Minutes per session: Not on file  . Stress: Not on file  Relationships  . Social connections:    Talks on phone: Not on file    Gets together: Not on file    Attends religious service: Not on file    Active member of club or organization: Not on file    Attends meetings of clubs or organizations: Not on file    Relationship status: Not on file  Other Topics Concern  . Not on file  Social History Narrative  . Not on file   History reviewed. No pertinent family history. Scheduled Meds: . chlorhexidine gluconate (MEDLINE KIT)  15 mL Mouth Rinse BID  . fentaNYL (SUBLIMAZE) injection  50 mcg Intravenous Once  . mouth rinse  15 mL Mouth Rinse 10 times per day  . pantoprazole (PROTONIX) IV  40 mg Intravenous Q12H   Continuous Infusions: . sodium chloride 75 mL/hr at 09/24/17 0800  . amiodarone  30 mg/hr (09/24/17 0953)  . fentaNYL infusion INTRAVENOUS 25 mcg/hr (09/24/17 0800)  . piperacillin-tazobactam (ZOSYN)  IV Stopped (09/24/17 0920)  . potassium chloride 10 mEq (09/24/17 0921)   PRN Meds:.acetaminophen **OR** acetaminophen, fentaNYL, ipratropium-albuterol, midazolam, midazolam, sennosides Allergies  Allergen Reactions  . Sulfa Antibiotics Other (See Comments)    "I get real achey" Other reaction(s): Muscle Pain, Other (See Comments)  General body aches andpain.    Review of Systems  Unable to perform ROS: Intubated    Physical Exam  Constitutional: No distress. She is sedated and intubated.  HENT:  Head: Normocephalic and atraumatic.  Cardiovascular: Normal rate. An irregular rhythm present.  Pulmonary/Chest: Breath sounds normal. She is intubated.  Abdominal: Soft. Bowel sounds are normal.  Musculoskeletal: She exhibits edema.  Neurological:  Lightly sedated, opens eyes to voice, nods head appropriately  Skin: Skin is warm and dry.    Vital Signs: BP 103/85   Pulse (!) 105   Temp 98.3 F (36.8 C) (Oral)   Resp 15   Ht 5' 5"  (1.651 m)   Wt 58.7 kg (129 lb 6.6 oz)   SpO2 99%   BMI 21.53 kg/m  Pain Scale: CPOT       SpO2: SpO2: 99 % O2 Device:SpO2: 99 % O2 Flow Rate: .   IO: Intake/output summary:   Intake/Output Summary (Last 24 hours) at 09/24/2017 1025 Last data filed at 09/24/2017 7517 Gross per 24 hour  Intake 4780.45 ml  Output 910 ml  Net 3870.45 ml    LBM: Last BM Date: 09/23/17 Baseline Weight: Weight: 59 kg (130 lb) Most recent weight: Weight: 58.7 kg (129 lb 6.6 oz)     Palliative Assessment/Data: PPS 30%    Time Total: 50 minutes Greater than 50%  of this time was spent counseling and coordinating care related to the above assessment and plan.  Juel Burrow, DNP, AGNP-C Palliative Medicine Team [phone] Pager: [phone]

## 2017-09-24 NOTE — Progress Notes (Addendum)
Initial Nutrition Assessment  DOCUMENTATION CODES:   Not applicable  INTERVENTION:   If tube feeds initiated, recommend Vital 1.2 @goal  rate of 56ml/hr  Regimen provides 1440kcal/day, 90g/day, 978ml/day   Recommend Ocuvite daily for wound healing (provides zinc, vitamin A, vitamin C, Vitamin E, copper, and selenium)  Pt likely at high refeeding risk; recommend monitor K, Mg, and P labs  NUTRITION DIAGNOSIS:   Inadequate oral intake related to acute illness as evidenced by NPO status.  GOAL:   Provide needs based on ASPEN/SCCM guidelines  MONITOR:   Diet advancement, Vent status, Labs, Weight trends, Skin, I & O's  REASON FOR ASSESSMENT:   Ventilator    ASSESSMENT:   72 yo female with a PMH of Stroke, HTN, Rheumatoid Arthritis,  Hypercholesteremia, Dysrhythmia, Diabetes Mellitus, Uterine Cancer, Diffuse Large B Cell Lymphoma, Atrial Fibrillation (previously on eliquis held due to recent surgery), Chronic Diastolic CHF (EF 55 to 08% on 09/08/17) , Depression, and Arthritis.  She presented to [facility] ER on 06/19 via EMS from the [facility] after being found minimally responsive with rapid respirations and increased heart rate. She recently had a complicated hospitalization at [facility] from 65/10/8467-62/95/2841 for complicated cholecystitis with cholecystectomy. She underwent an ERCP with metal stent placement and stone extraction with lap chole.  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 She was discharged from [facility] on 09/18/17 to the [facility] for rehab.    Pt ventilated. Family at bedside reports pt "eats like a bird" at baseline. Pt does drink Ensure but not regularly. Family reports that pt is fairly weight stable but has slowly declined in her weight as she has gotten older.  OGT in place; documented side port around GEJ; radiologist recommends advancing tube by ~5cm. RN notified. Plan to possibly extubated today. Pt likely at high refeeding risk; recommend monitor K, Mg, and P labs if tube feeds initiated.   Medications reviewed and include: fentanyl, insulin, protonix, NaCl @75ml /hr, amiodarone, fentanyl, zosyn, K Phos   Labs reviewed: K 3.2(L), Ca 7.8(L), P 1.6(L), Mg 1.8 wnl BNP 1095(H)- 6/19 Hgb 9.4(L), Hct 29.0(L) cbgs- 99, 68, 192, 147, 172 x 24hrs  Patient is currently intubated on ventilator support MV: 6.7 L/min Temp (24hrs), Avg:98.6 F (37 C), Min:98 F (36.7 C), Max:99.8 F (37.7 C)  Propofol: none   MAP- >90mmHg  NUTRITION - FOCUSED PHYSICAL EXAM:    Most Recent Value  Orbital Region  No depletion  Upper Arm Region  No depletion  Thoracic and Lumbar Region  No depletion  Buccal Region  No depletion  Temple Region  No depletion  Clavicle Bone Region  No depletion  Clavicle and Acromion Bone Region  No depletion  Scapular Bone Region  No depletion  Dorsal Hand  No depletion  Patellar Region  No depletion  Anterior Thigh Region  No depletion  Posterior Calf Region  No depletion  Edema (RD Assessment)  Mild [BLE, BUE]  Hair  Reviewed  Eyes  Reviewed  Mouth  Reviewed  Skin  Reviewed- ecchymosis BUE  Nails  Reviewed     Diet Order:   Diet Order           Diet NPO time specified  Diet effective now         EDUCATION NEEDS:   Not appropriate for education at  this time  Skin:  Skin Assessment: Reviewed RN Assessment(Stage I sacrum )  Last BM:  6/19  Height:   Ht Readings from Last 1 Encounters:  09/23/17 5\' 5"  (1.651 m)    Weight:   Wt Readings from Last 1 Encounters:  09/23/17 129 lb 6.6 oz (58.7 kg)    Ideal Body Weight:  56.8 kg  BMI:  Body mass index is 21.53 kg/m.  Estimated Nutritional Needs:   Kcal:  1345kcal/day   Protein:  88-100g/day   Fluid:  >1.5L/day   Koleen Distance MS, RD, LDN Pager #-  [phone] Office#- [phone] After Hours Pager: [phone]

## 2017-09-24 NOTE — Progress Notes (Signed)
eeg completed ° °

## 2017-09-24 NOTE — Progress Notes (Addendum)
Inpatient Diabetes Program Recommendations  AACE/ADA: New Consensus Statement on Inpatient Glycemic Control (2015)  Target Ranges:  Prepandial:   less than 140 mg/dL      Peak postprandial:   less than 180 mg/dL (1-2 hours)      Critically ill patients:  140 - 180 mg/dL   Results for LEXIS, POTENZA (MRN [id number]) as of 09/24/2017 08:42  Ref. Range 09/23/2017 18:12 09/23/2017 22:30 09/23/2017 23:41  Glucose-Capillary Latest Ref Range: 65 - 99 mg/dL 286 (H) 305 (H)  10 units Novolog IV + 50 ml D50% for High K+ 336 (H)  IV Insulin Drip   Results for Nina Hudson, Nina Hudson (MRN [id number]) as of 09/24/2017 08:42  Ref. Range 09/24/2017 01:03 09/24/2017 02:05 09/24/2017 03:08 09/24/2017 04:03 09/24/2017 05:06 09/24/2017 06:02 09/24/2017 07:14 09/24/2017 08:14  Glucose-Capillary Latest Ref Range: 65 - 99 mg/dL 270 (H)  IV Insulin Drip 247 (H)  IV Insulin Drip 224 (H)  IV Insulin Drip 186 (H)  IV Insulin Drip 138 (H)  IV Insulin Drip 99  IV Insulin Drip 68  IV Insulin Drip OFF 192 (H)     Admit: Acute Resp Failure/ Pneumonia/ Sepsis  History: DM, Lymphoma  Home DM Meds: Metformin 500 mg BID         Glipizide 5 mg daily  Current Orders: None yet     Patient with mild Hypoglycemia at 6am today whie on IV Insulin drip.  Likely had Hypoglycemia from lack of addition of IVF containing Dextrose.    IV Insulin drip stopped at 6am.  Patient currently Intubated.     MD- Please consider starting Phase 1 (SQ Novolog) per the ICU Glycemic Control order set  Recommend starting with the Sensitive scale Novolog per the order set Q4 hours     --Will follow patient during hospitalization--  Wyn Quaker RN, MSN, CDE Diabetes Coordinator Inpatient Glycemic Control Team Team Pager: [phone] (8a-5p)

## 2017-09-24 NOTE — Progress Notes (Signed)
PULMONARY / CRITICAL CARE MEDICINE   Name: EZMA REHM MRN: [id number] DOB: [birth year]    ADMISSION DATE:  09/23/2017 CONSULTATION DATE: 09/23/2017  REFERRING MD: Dr. Posey Pronto   CHIEF COMPLAINT: Unresponsiveness   HISTORY OF PRESENT ILLNESS:   This is a 72 yo female with a PMH of Stroke, HTN, Rheumatoid Arthritis,  Hypercholesteremia, Dysrhythmia, Diabetes Mellitus, Uterine Cancer, Diffuse Large B Cell Lymphoma, Atrial Fibrillation (previously on eliquis held due to recent surgery), Chronic Diastolic CHF (EF 55 to 54% on 09/08/17) , Depression, and Arthritis.  She presented to [facility] ER on 06/19 via EMS from the [facility] after being found minimally responsive with rapid respirations and increased heart rate. The nurse practitioner at the [facility] spoke with pts husband regarding change in pt condition, and her concern of possible pneumonia.  She discussed plan of care options with pts husband and he decided he wanted pt transported to [facility] for aggressive treatment.  She recently had a complicated hospitalization at [facility] from [id number] for complicated cholecystitis with cholecystectomy. She underwent an ERCP with metal stent placement and stone extraction with lap chole.  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 She was discharged from [facility] on 09/18/17 to the [facility] for rehab.  According to pts daughter since discharge from [facility] she has had poor po intake, c/o weakness/fatigue, and unable to participate in rehab. Upon arrival to [facility] ER she was hypoxic O2 sats 80's on 4L O2 with agonal respirations requiring mechanical intubation.  Pt found to be guaiac positive with black stool with hgb 8.4, therefore she received 1 unit of pRBC's.  CXR concerning for pneumonia vs.  pulmonary edema and lactic acid 7.5, she received 1L fluid bolus and iv abx.  She was subsequently admitted to ICU by hospitalist team for further workup and treatment.      REVIEW OF SYSTEMS:   Unable to assess pt intubated  SUBJECTIVE:  Unable to assess pt intubated   VITAL SIGNS: BP 103/85   Pulse (!) 105   Temp 98.3 F (36.8 C) (Oral)   Resp 15   Ht 5\' 5"  (1.651 m)   Wt 129 lb 6.6 oz (58.7 kg)   SpO2 100%   BMI 21.53 kg/m   HEMODYNAMICS:  no compromise  VENTILATOR SETTINGS: Vent Mode: PRVC FiO2 (%):  [40 %-100 %] 40 % Set Rate:  [15 bmp] 15 bmp Vt Set:  [450 mL-459 mL] 450 mL PEEP:  [5 cmH20] 5 cmH20  INTAKE / OUTPUT: I/O last 3 completed shifts: In: 3999.1 [I.V.:3359.3; Blood:330; IV Piggyback:309.8] Out: 850 [Urine:730; Emesis/NG output:120]  PHYSICAL EXAMINATION: General: acutely ill appearing female, on  mechanically ventilator Neuro: sedated but followed simple commands, PERRL  HEENT: supple, no JVD Cardiovascular: irregular irregular, no R/G Lungs: ghood A/E bilateral, no rales or rhonchi Abdomen: RUQ incision site with steri strips in place with dried blood and ecchymosis with small hematoma, +BS x4, soft, non distended, tender  Musculoskeletal: 2+ bilateral lower extremity edema Skin: RUQ incision site   LABS:  BMET Recent Labs  Lab 09/23/17 1323 09/23/17 2128 09/24/17 0148 09/24/17 0540  NA 142  --  142 143  K 4.8 6.3* 3.1* 3.2*  CL 96*  --  100* 101  CO2 35*  --  33* 34*  BUN 20  --  21* 19  CREATININE 0.70  --  0.71 0.78  GLUCOSE 313*  --  285* 175*    Electrolytes Recent Labs  Lab 09/21/17 1254 09/23/17 1323 09/23/17 2128 09/24/17 0148 09/24/17 0540  CALCIUM 8.5* 8.2*  --  8.0* 7.8*  MG 2.0  --  1.9  --  1.8  PHOS  --   --  2.3*  --  1.6*    CBC Recent Labs  Lab 09/21/17 1254 09/23/17 1323 09/23/17 2306 09/24/17 0540  WBC 5.6 6.8  --  6.6  HGB 9.0* 8.4* 9.7* 9.4*  9.7*  HCT 27.8* 26.1* 29.3* 29.0*  29.0*  PLT  141* 163  --  154    Coag's Recent Labs  Lab 09/23/17 1323  APTT 32  INR 2.50    Sepsis Markers Recent Labs  Lab 09/23/17 1837  09/23/17 2306 09/24/17 0148 09/24/17 1010  LATICACIDVEN  --    < > 4.1* 4.0* 2.6*  PROCALCITON 0.19  --   --   --   --    < > = values in this interval not displayed.    ABG Recent Labs  Lab 09/23/17 1321  PHART 7.49*  PCO2ART 48  PO2ART 352*    Liver Enzymes Recent Labs  Lab 09/21/17 1254 09/23/17 1323  AST 30 39  ALT 24 24  ALKPHOS 145* 132*  BILITOT 2.2* 2.2*  ALBUMIN 2.8* 2.9*    Cardiac Enzymes Recent Labs  Lab 09/23/17 1323 09/23/17 2128  TROPONINI 0.05* 0.04*    Glucose Recent Labs  Lab 09/24/17 0506 09/24/17 0602 09/24/17 0714 09/24/17 0814 09/24/17 0953 09/24/17 1112  GLUCAP 138* 99 68 192* 147* 172*    Imaging Dg Abd 1 View  Result Date: 09/23/2017 CLINICAL DATA:  72 year old female NG tube placement. EXAM: ABDOMEN - 1 VIEW COMPARISON:  Portable chest 1310 hours today. FINDINGS: Portable AP semi upright view at 1930 hours. Enteric tube courses to the left upper quadrant, tip at the level of the gastric body. The side hole is at the level of the gastroesophageal junction and could be advanced 5 centimeters for more optimal placement. Stable lung bases. Negative visible bowel gas pattern. There is a midline metallic stent projecting over the lower lumbar spine. No acute osseous abnormality identified. IMPRESSION: NG tube tip in the proximal stomach. Advance the tube 5 centimeters to ensure side hole placement inside the stomach. Electronically Signed   By: Genevie Ann M.D.   On: 09/23/2017 19:59   Ct Head Wo Contrast  Result Date: 09/23/2017 CLINICAL DATA:  Patient found unresponsive yesterday. EXAM: CT HEAD WITHOUT CONTRAST TECHNIQUE: Contiguous axial images were obtained from the base of the skull through the vertex without intravenous contrast. COMPARISON:  Brain MRI 08/21/2017. FINDINGS: Brain: No evidence of  acute infarction, hemorrhage, hydrocephalus, extra-axial collection or mass lesion/mass effect. Cortical atrophy and chronic microvascular ischemic change are noted. Remote lacunar infarction left caudate head is identified. Vascular: No hyperdense vessel or unexpected calcification. Skull: Intact.  No focal lesion. Sinuses/Orbits: There is fairly extensive ethmoid air cell disease and marked mucosal thickening in the left maxillary sinus. Mild mucosal thickening right sphenoid sinus is present. The left frontal sinus is opacified. Other: None. IMPRESSION: No acute abnormality. Atrophy and chronic microvascular ischemic change. Sinus disease. Electronically Signed   By: Inge Rise M.D.   On: 09/23/2017 14:30   Ct Angio Chest Pe W Or Wo Contrast  Result Date: 09/23/2017 CLINICAL DATA:  Acute respiratory failure. Recent exploratory laparotomy and  cholecystectomy. History of lymphoma and uterine cancer. EXAM: CT ANGIOGRAPHY CHEST WITH CONTRAST TECHNIQUE: Multidetector CT imaging of the chest was performed using the standard protocol during bolus administration of intravenous contrast. Multiplanar CT image reconstructions and MIPs were obtained to evaluate the vascular anatomy. CONTRAST:  31mL ISOVUE-370 IOPAMIDOL (ISOVUE-370) INJECTION 76% COMPARISON:  Chest radiograph September 23, 2017 FINDINGS: CARDIOVASCULAR: Adequate contrast opacification of the pulmonary artery's. Main pulmonary artery is not enlarged. No pulmonary arterial filling defects to the level of the subsegmental branches. Heart size is normal, no right heart strain. Mild coronary artery calcifications. Trace pericardial effusion. Thoracic aorta is normal course and caliber, mild calcific atherosclerosis. MEDIASTINUM/NODES: No lymphadenopathy by CT size criteria. LUNGS/PLEURA: Moderate RIGHT and small LEFT pleural effusions. Bilateral lower lobe compressive atelectasis versus pneumonia. Scattered clusters of bilateral tree-in-bud infiltrates.  Bronchial wall thickening. Endotracheal tube tip above the carina. Mild centrilobular emphysema. No pneumothorax. UPPER ABDOMEN: Partially imaged biliary drain with pneumobilia and debris within surgical bed. MUSCULOSKELETAL: Thickened RIGHT platysma and subcutaneous fat stranding incompletely imaged. Mild T11, mild L1 and mild-to-moderate L2 old compression fractures. Mild spondylosis. Review of the MIP images confirms the above findings. IMPRESSION: 1. Moderate RIGHT and small LEFT pleural effusions. Bibasilar atelectasis and/or pneumonia. 2. Bronchial wall thickening seen with bronchitis. Scattered tree-in-bud infiltrates may be infectious or inflammatory. 3. Partially imaged biliary stent with pneumobilia and debris within gallbladder fossa. Aortic Atherosclerosis (ICD10-I70.0) and Emphysema (ICD10-J43.9). Electronically Signed   By: Elon Alas M.D.   On: 09/23/2017 22:10   Dg Chest Port 1 View  Result Date: 09/24/2017 CLINICAL DATA:  Acute respiratory failure EXAM: PORTABLE CHEST 1 VIEW COMPARISON:  Yesterday FINDINGS: Endotracheal tube tip between the clavicular heads and carina. An orogastric tube reaches the stomach. Artifact from EKG leads. Layering pleural effusions with atelectasis. There is hyperinflation. Normal heart size. IMPRESSION: 1. Unremarkable positioning of endotracheal and orogastric tubes. 2. Small bilateral layering pleural effusion. 3. Probable COPD. Electronically Signed   By: Monte Fantasia M.D.   On: 09/24/2017 07:55   Dg Chest Port 1 View  Result Date: 09/23/2017 CLINICAL DATA:  Tube placement. EXAM: PORTABLE CHEST 1 VIEW COMPARISON:  Radiographs of April 10, 2014. FINDINGS: Stable cardiomediastinal silhouette. Interval placement of endotracheal tube with distal tip 6 cm above the carina. No pneumothorax is noted. Mild bibasilar subsegmental atelectasis or edema is noted with small pleural effusions. Bony thorax is unremarkable. IMPRESSION: Endotracheal tube in grossly  good position. Nasogastric tube is seen entering stomach. Mild bibasilar subsegmental atelectasis or edema is noted with small pleural effusions. Electronically Signed   By: Marijo Conception, M.D.   On: 09/23/2017 13:55   STUDIES/SIGNIFICANT EVENTS: 06/19 CT Head revealed no acute abnormality. Atrophy and chronic microvascular ischemic change. Sinus disease.  CULTURES: Blood x2 06/19>> Respiratory 06/19>> Urine 06/19>>  ANTIBIOTICS: Zosyn 06/19>> Vancomycin 06/19 x1 dose   LINES/TUBES: ETT 06/19>>  ASSESSMENT / PLAN:  PULMONARY A: Acute hypoxic respiratory failure secondary to pneumonia vs. pulmonary edema  Bilateral pleural effusions Mechanical Intubation  P:   SBT as tolerated Prn bronchodilator therapy  VAP bundle implemented  CTA Chest has ruled out PE  CARDIOVASCULAR A:  Demand ischemia  Intermittent Vfibb/Vtach no further episodes ( A-Fib with aberrant?) Chronic Atrial Fibrillation with RVR- better controlled now  P:  Continuous telemetry monitoring  Trend troponin's Maintain map >65 Will start amiodarone gtt   RENAL A:   Lactic acidosis trending down this AM P:   Trend BMP and lactic acid  Replace  electrolytes as indicated  CT abdomen and Pelvis for etiology  of Lactic acid Monitor UOP   GASTROINTESTINAL A:   Hx Gastrointestinal Bleeding  Hx: Cholecystitis s/p ERCP with stone removal and metal stent placement along with lap chole complicated by hemoperitoneum secondary to cystic artery bleed requiring ligation-09/09/17 P:   Keep NPO for now  NG to LIS  SUP px: IV protonix   HEMATOLOGIC A:   Hx Acute blood loss anemia  P:  Serial H&H Monitor for s/sx of bleeding  Transfuse for hgb <7 and/or active bleeding  VTE px: SCD's, holding outpatient eliquis  If she shows active signs of bleeding will consult GI and order GI Bleeding Scan  INFECTIOUS A:   Pneumonia  P:   Trend WBC and monitor fever curve Trend PCT Follow cultures Continue abx as  listed above  ENDOCRINE A:   Diabetes Mellitus  P:   CBG's q4hrs  SSI   NEUROLOGIC A:   Acute metabolic encephalopathy likely secondary to hypoxia - clinically improving Mechanical Intubation Discomfort/Pain  P:   RASS goal: 0 to -1 Prn fentanyl gtt to maintain RASS goal  WUA daily    FAMILY  - Updates: Spoke with pts daughter regarding place of care and all questions answered  I have dedicated a total of 40 minutes in critical care time minus all appropriate exclusions.  Cammie Sickle, MD  Pulmonary/Critical Care Pager [phone] (please enter 7 digits) [place] Pager [phone] (please enter 7 digits)

## 2017-09-24 NOTE — Progress Notes (Signed)
[place] at [facility]                                                                                                                                                                                  Patient Demographics   Nina Hudson, is a 72 y.o. female, DOB - 1945-11-23, POL:[id number]  Admit date - 09/23/2017   Admitting Physician Fritzi Mandes, MD  Outpatient Primary MD for the patient is Derinda Late, MD   LOS - 1  Subjective: Patient is intubated and sedated    Review of Systems:   CONSTITUTIONAL: Intubated  Vitals:   Vitals:   09/24/17 1200 09/24/17 1300 09/24/17 1400 09/24/17 1500  BP: 109/76 99/79 118/86 114/79  Pulse: (!) 108 92    Resp: 15 15 15 15   Temp: 98.3 F (36.8 C)     TempSrc: Oral     SpO2: 100% 100%    Weight:      Height:        Wt Readings from Last 3 Encounters:  09/23/17 58.7 kg (129 lb 6.6 oz)  08/09/17 54.4 kg (120 lb)  09/28/15 55.8 kg (123 lb)     Intake/Output Summary (Last 24 hours) at 09/24/2017 1524 Last data filed at 09/24/2017 1400 Gross per 24 hour  Intake 3581.15 ml  Output 1015 ml  Net 2566.15 ml    Physical Exam:   GENERAL: Critically ill-appearing HEAD, EYES, EARS, NOSE AND THROAT: Atraumatic, normocephalic. Extraocular muscles are intact. Pupils equal and reactive to light. Sclerae anicteric. No conjunctival injection. No oro-pharyngeal erythema.  NECK: Supple. There is no jugular venous distention. No bruits, no lymphadenopathy, no thyromegaly.  HEART: Irregularly irregular. No murmurs, no rubs, no clicks.  LUNGS: Clear to auscultation bilaterally. No rales or rhonchi. No wheezes.  ABDOMEN: Soft, flat, nontender, nondistended. Has good bowel sounds. No hepatosplenomegaly appreciated.  EXTREMITIES: No evidence of any cyanosis, clubbing, or peripheral edema.  +2 pedal and radial pulses bilaterally.  NEUROLOGIC: T sedated  sKIN: Moist and warm with no rashes appreciated.  Psych: Sedated   LN: No inguinal LN enlargement    Antibiotics   Anti-infectives (From admission, onward)   Start     Dose/Rate Route Frequency Ordered Stop   09/23/17 2200  piperacillin-tazobactam (ZOSYN) IVPB 3.375 g     3.375 g 12.5 mL/hr over 240 Minutes Intravenous Every 8 hours 09/23/17 1935     09/23/17 1400  vancomycin (VANCOCIN) IVPB 1000 mg/200 mL premix     1,000 mg 200 mL/hr over 60 Minutes Intravenous  Once 09/23/17 1348 09/23/17 1530   09/23/17 1400  piperacillin-tazobactam (ZOSYN) IVPB 3.375 g     3.375 g 100  mL/hr over 30 Minutes Intravenous  Once 09/23/17 1348 09/23/17 1430      Medications   Scheduled Meds: . chlorhexidine gluconate (MEDLINE KIT)  15 mL Mouth Rinse BID  . fentaNYL (SUBLIMAZE) injection  50 mcg Intravenous Once  . insulin aspart  0-15 Units Subcutaneous Q4H  . mouth rinse  15 mL Mouth Rinse 10 times per day  . pantoprazole (PROTONIX) IV  40 mg Intravenous Q12H   Continuous Infusions: . sodium chloride 75 mL/hr at 09/24/17 1400  . amiodarone 30 mg/hr (09/24/17 1400)  . fentaNYL infusion INTRAVENOUS 25 mcg/hr (09/24/17 1400)  . piperacillin-tazobactam (ZOSYN)  IV 3.375 g (09/24/17 1400)  . potassium PHOSPHATE IVPB (in mmol) 85 mL/hr at 09/24/17 1400   PRN Meds:.acetaminophen **OR** acetaminophen, fentaNYL, ipratropium-albuterol, midazolam, midazolam, sennosides   Data Review:   Micro Results Recent Results (from the past 240 hour(s))  Blood Culture (routine x 2)     Status: None (Preliminary result)   Collection Time: 09/23/17  1:26 PM  Result Value Ref Range Status   Specimen Description BLOOD LEFT ANTECUBITAL  Final   Special Requests   Final    BOTTLES DRAWN AEROBIC AND ANAEROBIC Blood Culture results may not be optimal due to an excessive volume of blood received in culture bottles   Culture   Final    NO GROWTH < 24 HOURS Performed at [facility], [address] [place], [place] [address]    Report Status PENDING  Incomplete   Blood Culture (routine x 2)     Status: None (Preliminary result)   Collection Time: 09/23/17  1:27 PM  Result Value Ref Range Status   Specimen Description BLOOD RIGHT ANTECUBITAL  Final   Special Requests   Final    BOTTLES DRAWN AEROBIC AND ANAEROBIC Blood Culture adequate volume   Culture   Final    NO GROWTH < 24 HOURS Performed at [facility], [address], [place], [place] [address]    Report Status PENDING  Incomplete  Urine culture     Status: None   Collection Time: 09/23/17  1:27 PM  Result Value Ref Range Status   Specimen Description   Final    URINE, RANDOM Performed at [facility], [address], [place], [place] [address]    Special Requests   Final    NONE Performed at [facility], [address] [place], [place] [address]    Culture   Final    NO GROWTH Performed at [facility], [place] [address] [place], [place] [address]    Report Status 09/24/2017 FINAL  Final  Culture, respiratory (NON-Expectorated)     Status: None (Preliminary result)   Collection Time: 09/23/17  2:11 PM  Result Value Ref Range Status   Specimen Description   Final    TRACHEAL ASPIRATE Performed at [facility], [address], [place], [place] [address]    Special Requests   Final    NONE Performed at [facility], [address] [place], [place] [address]    Gram Stain   Final    ABUNDANT WBC PRESENT, PREDOMINANTLY PMN FEW GRAM POSITIVE COCCI RARE GRAM POSITIVE RODS RARE GRAM NEGATIVE COCCOBACILLI    Culture   Final    CULTURE REINCUBATED FOR BETTER GROWTH Performed at [facility], [place] [address], [place],  [address]    Report Status PENDING  Incomplete  MRSA PCR Screening     Status: None  Collection Time: 09/23/17  6:17 PM  Result Value Ref Range Status   MRSA by PCR NEGATIVE NEGATIVE Final    Comment:        The GeneXpert MRSA Assay (FDA approved for NASAL specimens only), is one  component of a comprehensive MRSA colonization surveillance program. It is not intended to diagnose MRSA infection nor to guide or monitor treatment for MRSA infections. Performed at [facility], [address] [place], [place] [address]   Culture, respiratory (NON-Expectorated)     Status: None (Preliminary result)   Collection Time: 09/23/17  8:59 PM  Result Value Ref Range Status   Specimen Description   Final    TRACHEAL ASPIRATE Performed at [facility], [address], [place], [place] [address]    Special Requests   Final    Normal Performed at [facility], [address] [place], [place] [address]    Gram Stain   Final    FEW WBC PRESENT, PREDOMINANTLY PMN RARE GRAM POSITIVE COCCI Performed at [facility], [place] [address], [place], [place] [address]    Culture PENDING  Incomplete   Report Status PENDING  Incomplete    Radiology Reports Ct Abdomen Pelvis Wo Contrast  Result Date: 09/24/2017 CLINICAL DATA:  S/P ERCP and stent for stone removal. Now with persistent lactic acidosis. PO contrast only pe chest done yesterday. EXAM: CT ABDOMEN AND PELVIS WITHOUT CONTRAST TECHNIQUE: Multidetector CT imaging of the abdomen and pelvis was performed following the standard protocol without IV contrast. COMPARISON:  CT chest from previous day, PET-CT 11/16/2012, and earlier studies FINDINGS: Lower chest: Bilateral pleural effusions right greater than left as before. Adjacent consolidation/atelectasis posteriorly in the lower lobes. Scattered subpleural patchy somewhat nodular opacities in the visualized lung bases as before. Hepatobiliary: Metallic CBD stent. Gas in the intrahepatic biliary tree implying stent patency. Cholecystectomy clips. No discrete liver lesion. Pancreas: Scattered coarse calcifications in the pancreatic head near the stent. No mass or ductal dilatation. Spleen: Normal in size without focal abnormality. Adrenals/Urinary Tract:  Unremarkable adrenals. Residual contrast in the renal collecting systems without hydronephrosis. No focal renal lesion is evident. Foley catheter partially decompresses the urinary bladder. Stomach/Bowel: Stomach is nondilated. Incomplete distal passage of oral contrast material. Small bowel grossly unremarkable. Multiple sigmoid diverticula. Fecal material distends the rectum. There are mild perirectal and presacral edematous/inflammatory changes. Vascular/Lymphatic: Aortoiliac atherosclerosis (ICD10-170.0). Infrarenal aorta 2.6 cm diameter. Tortuous venous collateral channels in the left retroperitoneum associated with the left renal vein, present on studies dating back to 05/22/2007. No abdominal or pelvic adenopathy localized. Reproductive: Status post hysterectomy. No adnexal masses. Other: There is a small amount of high attenuation perihepatic and pelvic ascites. No free air. Musculoskeletal: Superior endplate compression deformities of T11, L1, L2, and L3, age indeterminate. No definite acute fracture or worrisome bone lesion. IMPRESSION: 1. Small amount of perihepatic and pelvic ascites. Attenuation above simple fluid suggests blood, contrast material, or proteinaceous component. 2. Stable placement of patent metallic biliary stent. 3. Fecal distention of the rectum with regional inflammatory/edematous changes, suggesting possible stercoral proctitis. 4. Sigmoid diverticulosis. 5. Lower thoracic and lumbar mild compression deformities, age indeterminate 6. Ectatic abdominal aorta at risk for aneurysm development. Recommend followup by ultrasound in 5 years. This recommendation follows ACR consensus guidelines: White Paper of the ACR Incidental Findings Committee II on Vascular Findings. J Am Coll Radiol 2013; 10:789-794. Electronically Signed   By: Lucrezia Europe M.D.   On: 09/24/2017 12:07   Dg  Abd 1 View  Result Date: 09/23/2017 CLINICAL DATA:  72 year old female NG tube placement. EXAM: ABDOMEN - 1 VIEW  COMPARISON:  Portable chest 1310 hours today. FINDINGS: Portable AP semi upright view at 1930 hours. Enteric tube courses to the left upper quadrant, tip at the level of the gastric body. The side hole is at the level of the gastroesophageal junction and could be advanced 5 centimeters for more optimal placement. Stable lung bases. Negative visible bowel gas pattern. There is a midline metallic stent projecting over the lower lumbar spine. No acute osseous abnormality identified. IMPRESSION: NG tube tip in the proximal stomach. Advance the tube 5 centimeters to ensure side hole placement inside the stomach. Electronically Signed   By: Genevie Ann M.D.   On: 09/23/2017 19:59   Ct Head Wo Contrast  Result Date: 09/23/2017 CLINICAL DATA:  Patient found unresponsive yesterday. EXAM: CT HEAD WITHOUT CONTRAST TECHNIQUE: Contiguous axial images were obtained from the base of the skull through the vertex without intravenous contrast. COMPARISON:  Brain MRI 08/21/2017. FINDINGS: Brain: No evidence of acute infarction, hemorrhage, hydrocephalus, extra-axial collection or mass lesion/mass effect. Cortical atrophy and chronic microvascular ischemic change are noted. Remote lacunar infarction left caudate head is identified. Vascular: No hyperdense vessel or unexpected calcification. Skull: Intact.  No focal lesion. Sinuses/Orbits: There is fairly extensive ethmoid air cell disease and marked mucosal thickening in the left maxillary sinus. Mild mucosal thickening right sphenoid sinus is present. The left frontal sinus is opacified. Other: None. IMPRESSION: No acute abnormality. Atrophy and chronic microvascular ischemic change. Sinus disease. Electronically Signed   By: Inge Rise M.D.   On: 09/23/2017 14:30   Ct Angio Chest Pe W Or Wo Contrast  Result Date: 09/23/2017 CLINICAL DATA:  Acute respiratory failure. Recent exploratory laparotomy and cholecystectomy. History of lymphoma and uterine cancer. EXAM: CT  ANGIOGRAPHY CHEST WITH CONTRAST TECHNIQUE: Multidetector CT imaging of the chest was performed using the standard protocol during bolus administration of intravenous contrast. Multiplanar CT image reconstructions and MIPs were obtained to evaluate the vascular anatomy. CONTRAST:  10m ISOVUE-370 IOPAMIDOL (ISOVUE-370) INJECTION 76% COMPARISON:  Chest radiograph September 23, 2017 FINDINGS: CARDIOVASCULAR: Adequate contrast opacification of the pulmonary artery's. Main pulmonary artery is not enlarged. No pulmonary arterial filling defects to the level of the subsegmental branches. Heart size is normal, no right heart strain. Mild coronary artery calcifications. Trace pericardial effusion. Thoracic aorta is normal course and caliber, mild calcific atherosclerosis. MEDIASTINUM/NODES: No lymphadenopathy by CT size criteria. LUNGS/PLEURA: Moderate RIGHT and small LEFT pleural effusions. Bilateral lower lobe compressive atelectasis versus pneumonia. Scattered clusters of bilateral tree-in-bud infiltrates. Bronchial wall thickening. Endotracheal tube tip above the carina. Mild centrilobular emphysema. No pneumothorax. UPPER ABDOMEN: Partially imaged biliary drain with pneumobilia and debris within surgical bed. MUSCULOSKELETAL: Thickened RIGHT platysma and subcutaneous fat stranding incompletely imaged. Mild T11, mild L1 and mild-to-moderate L2 old compression fractures. Mild spondylosis. Review of the MIP images confirms the above findings. IMPRESSION: 1. Moderate RIGHT and small LEFT pleural effusions. Bibasilar atelectasis and/or pneumonia. 2. Bronchial wall thickening seen with bronchitis. Scattered tree-in-bud infiltrates may be infectious or inflammatory. 3. Partially imaged biliary stent with pneumobilia and debris within gallbladder fossa. Aortic Atherosclerosis (ICD10-I70.0) and Emphysema (ICD10-J43.9). Electronically Signed   By: CElon AlasM.D.   On: 09/23/2017 22:10   Dg Chest Port 1 View  Result Date:  09/24/2017 CLINICAL DATA:  Acute respiratory failure EXAM: PORTABLE CHEST 1 VIEW COMPARISON:  Yesterday FINDINGS: Endotracheal tube tip between the clavicular  heads and carina. An orogastric tube reaches the stomach. Artifact from EKG leads. Layering pleural effusions with atelectasis. There is hyperinflation. Normal heart size. IMPRESSION: 1. Unremarkable positioning of endotracheal and orogastric tubes. 2. Small bilateral layering pleural effusion. 3. Probable COPD. Electronically Signed   By: Monte Fantasia M.D.   On: 09/24/2017 07:55   Dg Chest Port 1 View  Result Date: 09/23/2017 CLINICAL DATA:  Tube placement. EXAM: PORTABLE CHEST 1 VIEW COMPARISON:  Radiographs of April 10, 2014. FINDINGS: Stable cardiomediastinal silhouette. Interval placement of endotracheal tube with distal tip 6 cm above the carina. No pneumothorax is noted. Mild bibasilar subsegmental atelectasis or edema is noted with small pleural effusions. Bony thorax is unremarkable. IMPRESSION: Endotracheal tube in grossly good position. Nasogastric tube is seen entering stomach. Mild bibasilar subsegmental atelectasis or edema is noted with small pleural effusions. Electronically Signed   By: Marijo Conception, M.D.   On: 09/23/2017 13:55     CBC Recent Labs  Lab 09/21/17 1254 09/23/17 1323 09/23/17 2306 09/24/17 0540  WBC 5.6 6.8  --  6.6  HGB 9.0* 8.4* 9.7* 9.4*  9.7*  HCT 27.8* 26.1* 29.3* 29.0*  29.0*  PLT 141* 163  --  154  MCV 94.7 96.5  --  92.1  MCH 30.6 31.1  --  30.8  MCHC 32.3 32.3  --  33.4  RDW 20.9* 21.9*  --  20.8*  LYMPHSABS 1.0 1.2  --   --   MONOABS 0.6 0.4  --   --   EOSABS 0.0 0.0  --   --   BASOSABS 0.0 0.0  --   --     Chemistries  Recent Labs  Lab 09/21/17 1254 09/23/17 1323 09/23/17 2128 09/24/17 0148 09/24/17 0540  NA 142 142  --  142 143  K 3.2* 4.8 6.3* 3.1* 3.2*  CL 96* 96*  --  100* 101  CO2 35* 35*  --  33* 34*  GLUCOSE 256* 313*  --  285* 175*  BUN 20 20  --  21* 19   CREATININE 0.70 0.70  --  0.71 0.78  CALCIUM 8.5* 8.2*  --  8.0* 7.8*  MG 2.0  --  1.9  --  1.8  AST 30 39  --   --   --   ALT 24 24  --   --   --   ALKPHOS 145* 132*  --   --   --   BILITOT 2.2* 2.2*  --   --   --    ------------------------------------------------------------------------------------------------------------------ estimated creatinine clearance is 56.4 mL/min (by C-G formula based on SCr of 0.78 mg/dL). ------------------------------------------------------------------------------------------------------------------ No results for input(s): HGBA1C in the last 72 hours. ------------------------------------------------------------------------------------------------------------------ No results for input(s): CHOL, HDL, LDLCALC, TRIG, CHOLHDL, LDLDIRECT in the last 72 hours. ------------------------------------------------------------------------------------------------------------------ No results for input(s): TSH, T4TOTAL, T3FREE, THYROIDAB in the last 72 hours.  Invalid input(s): FREET3 ------------------------------------------------------------------------------------------------------------------ No results for input(s): VITAMINB12, FOLATE, FERRITIN, TIBC, IRON, RETICCTPCT in the last 72 hours.  Coagulation profile Recent Labs  Lab 09/23/17 1323  INR 2.50    No results for input(s): DDIMER in the last 72 hours.  Cardiac Enzymes Recent Labs  Lab 09/23/17 1323 09/23/17 2128  TROPONINI 0.05* 0.04*   ------------------------------------------------------------------------------------------------------------------ Invalid input(s): POCBNP    Assessment & Plan   Nina Hudson  is a 72 y.o. female with a known history of diffuse large B cell lymphoma, uterine cancer, history of a fib on eliquis, diabetes, comes to the emergency room  from [place] where she was admitted on [phone]. Patient is being admitted type for acute on chronic hypoxic respiratory  failure secondary to suspected pneumonia/G.I. bleed/acute on chronic anemia.   1. acute on chronic hypoxic respiratory failure secondary to possible right lower lobe pneumonia -PE has been ruled out -Possible pneumonia positive tracheal aspirate Gram stain continue IV antibiotic   2. acute on chronic anemia -patient on eliquis -holding it at present -Hemoglobin stable this morning -Status post 1 unit of transfusion -GI consulted for further bleeding  3. History of ERCP with stone removal and stent placement along with lap [place] complicated by cystic artery bleed requiring exploratory laparotomy -this was done at [facility] on 65 2019 -surgical consultation if needed  4. Diabetes sliding scale insulin for now  5.   A. fib with RVR continue amiodarone drip   DVT prophylaxis SCD I andTed's patient has low hemoglobin hands holding eliquis for now as long as hemoglobin remains stable no evidence of G.I. bleeding recommend resuming        Code Status Orders  (From admission, onward)        Start     Ordered   09/23/17 1815  Full code  Continuous     09/23/17 1814    Code Status History    Date Active Date Inactive Code Status Order ID Comments User Context   09/28/2015 0939 09/28/2015 1325 Full Code [id number]  Dereck Leep, MD Inpatient    Advance Directive Documentation     Most Recent Value  Type of Advance Directive  Living will  Pre-existing out of facility DNR order (yellow form or pink MOST form)  -  "MOST" Form in Place?  -           Consults  intesivist  DVT Prophylaxis  scd's  Lab Results  Component Value Date   PLT 154 09/24/2017     Time Spent in minutes   34mn Greater than 50% of time spent in care coordination and counseling patient regarding the condition and plan of care.   SDustin FlockM.D on 09/24/2017 at 3:24 PM  Between 7am to 6pm - Pager - [phone]  After 6pm go to [email or web address] - pProofreader [employer]    Office  [phone]

## 2017-09-24 NOTE — Procedures (Signed)
ELECTROENCEPHALOGRAM REPORT   Patient: Nina Hudson       Room #: IC13A-AA EEG No. ID: [id number] Age: 72 y.o.        Sex: female Referring Physician: Posey Pronto Report Date:  09/24/2017        Interpreting Physician: Alexis Goodell  History: Nina Hudson is an 72 y.o. female with multiple medical problems presenting unresponsive.    Medications:  Peridex, Fentanyl, Protonix, Zosyn, Fentanyl, Versed, Amiodarone  Conditions of Recording:  This is a 16 channel EEG carried out with the patient in the intubated and sedated state.  Description:  The background activity is slow and poorly organized and consists of a polymorphic delta activity that is diffusely distributed and continuous.  Centrally is noted alpha and beta activity that is superimposed on this polymorphic delta activity and at times resembles sleep spindles.  Some vertex central sharp transients are noted as well.   No epileptiform activity is noted.   Hyperventilation and intermittent photic stimulation were not performed   IMPRESSION: This EEG is characterized by slowing which often resembles normal drowse.  What is felt to be normal sleep activity is noted as well.  Can not rule out the possibility of slowing related to general cerebral disturbance such as a metabolic encephalopathy.  Clinical correlation recommended.  No epileptiform activity is noted.     Alexis Goodell, MD Neurology [phone] 09/24/2017, 2:38 PM

## 2017-09-24 NOTE — Progress Notes (Signed)
Pharmacy Electrolyte Monitoring Consult:  Pharmacy consulted to assist in monitoring and replacing electrolytes in this 72 y.o. female admitted on 09/23/2017 with Unresponsive   Labs:  Sodium (mmol/Hudson)  Date Value  09/24/2017 143  04/11/2014 138   Potassium (mmol/Hudson)  Date Value  09/24/2017 3.2 (Hudson)  04/11/2014 3.9   Magnesium (mg/dL)  Date Value  09/24/2017 1.8  06/07/2012 1.8   Phosphorus (mg/dL)  Date Value  09/24/2017 1.6 (Hudson)  01/16/2012 3.0   Calcium (mg/dL)  Date Value  09/24/2017 7.8 (Hudson)   Calcium, Total (mg/dL)  Date Value  04/11/2014 8.1 (Hudson)   Albumin (g/dL)  Date Value  09/23/2017 2.9 (Hudson)  03/01/2013 3.4    Assessment/Plan: Will order potassium phosphate 30 mmol IV x 1. Will recheck electrolytes with am labs.   Pharmacy will continue to monitor and adjust per consult.   Nina Hudson 09/24/2017 8:48 PM

## 2017-09-25 ENCOUNTER — Inpatient Hospital Stay: Payer: Medicare Other

## 2017-09-25 LAB — COMPREHENSIVE METABOLIC PANEL
ALBUMIN: 2.3 g/dL — AB (ref 3.5–5.0)
ALK PHOS: 111 U/L (ref 38–126)
ALT: 18 U/L (ref 14–54)
AST: 32 U/L (ref 15–41)
Anion gap: 10 (ref 5–15)
BILIRUBIN TOTAL: 1.7 mg/dL — AB (ref 0.3–1.2)
BUN: 17 mg/dL (ref 6–20)
CALCIUM: 7.6 mg/dL — AB (ref 8.9–10.3)
CO2: 33 mmol/L — ABNORMAL HIGH (ref 22–32)
Chloride: 97 mmol/L — ABNORMAL LOW (ref 101–111)
Creatinine, Ser: 0.65 mg/dL (ref 0.44–1.00)
GFR calc Af Amer: >60 mL/min (ref >60)
GLUCOSE: 199 mg/dL — AB (ref 65–99)
Potassium: 3.7 mmol/L (ref 3.5–5.1)
Sodium: 140 mmol/L (ref 135–145)
TOTAL PROTEIN: 4.8 g/dL — AB (ref 6.5–8.1)

## 2017-09-25 LAB — BLOOD GAS, ARTERIAL
Acid-Base Excess: 14.2 mmol/L — ABNORMAL HIGH (ref 0.0–2.0)
Bicarbonate: 40 mmol/L — ABNORMAL HIGH (ref 20.0–28.0)
FIO2: 0.4
MECHANICAL RATE: 15
MECHVT: 450 mL
O2 Saturation: 98.9 %
PEEP: 5 cmH2O
Patient temperature: 37
pCO2 arterial: 55 mmHg — ABNORMAL HIGH (ref 32.0–48.0)
pH, Arterial: 7.47 — ABNORMAL HIGH (ref 7.350–7.450)
pO2, Arterial: 122 mmHg — ABNORMAL HIGH (ref 83.0–108.0)

## 2017-09-25 LAB — BASIC METABOLIC PANEL
ANION GAP: 11 (ref 5–15)
BUN: 17 mg/dL (ref 6–20)
CHLORIDE: 95 mmol/L — AB (ref 101–111)
CO2: 33 mmol/L — ABNORMAL HIGH (ref 22–32)
CREATININE: 0.76 mg/dL (ref 0.44–1.00)
Calcium: 7.6 mg/dL — ABNORMAL LOW (ref 8.9–10.3)
GFR calc non Af Amer: >60 mL/min (ref >60)
Glucose, Bld: 252 mg/dL — ABNORMAL HIGH (ref 65–99)
Potassium: 3.5 mmol/L (ref 3.5–5.1)
SODIUM: 139 mmol/L (ref 135–145)

## 2017-09-25 LAB — GLUCOSE, CAPILLARY
GLUCOSE-CAPILLARY: 171 mg/dL — AB (ref 65–99)
GLUCOSE-CAPILLARY: 176 mg/dL — AB (ref 65–99)
Glucose-Capillary: 162 mg/dL — ABNORMAL HIGH (ref 65–99)
Glucose-Capillary: 184 mg/dL — ABNORMAL HIGH (ref 65–99)
Glucose-Capillary: 195 mg/dL — ABNORMAL HIGH (ref 65–99)
Glucose-Capillary: 199 mg/dL — ABNORMAL HIGH (ref 65–99)

## 2017-09-25 LAB — MAGNESIUM: MAGNESIUM: 1.7 mg/dL (ref 1.7–2.4)

## 2017-09-25 LAB — LACTIC ACID, PLASMA: Lactic Acid, Venous: 2.7 mmol/L (ref 0.5–1.9)

## 2017-09-25 LAB — PHOSPHORUS: PHOSPHORUS: 3.4 mg/dL (ref 2.5–4.6)

## 2017-09-25 LAB — PROLACTIN: Prolactin: 50.9 ng/mL — ABNORMAL HIGH (ref 4.8–23.3)

## 2017-09-25 MED ORDER — OCUVITE-LUTEIN PO CAPS
1.0000 | ORAL_CAPSULE | Freq: Every day | ORAL | Status: DC
  Administered 2017-09-26 – 2017-09-28 (×3): 1
  Filled 2017-09-25 (×4): qty 1

## 2017-09-25 MED ORDER — PRO-STAT SUGAR FREE PO LIQD
30.0000 mL | Freq: Two times a day (BID) | ORAL | Status: DC
  Administered 2017-09-25 – 2017-09-28 (×7): 30 mL

## 2017-09-25 MED ORDER — DILTIAZEM HCL 30 MG PO TABS
30.0000 mg | ORAL_TABLET | Freq: Four times a day (QID) | ORAL | Status: DC
  Administered 2017-09-25 – 2017-10-01 (×22): 30 mg via ORAL
  Filled 2017-09-25 (×24): qty 1

## 2017-09-25 MED ORDER — VITAL 1.5 CAL PO LIQD
1000.0000 mL | ORAL | Status: DC
  Administered 2017-09-25 – 2017-09-27 (×4): 1000 mL

## 2017-09-25 MED ORDER — MAGNESIUM SULFATE IN D5W 1-5 GM/100ML-% IV SOLN
1.0000 g | Freq: Once | INTRAVENOUS | Status: AC
  Administered 2017-09-25: 1 g via INTRAVENOUS
  Filled 2017-09-25: qty 100

## 2017-09-25 MED ORDER — FENTANYL BOLUS VIA INFUSION
10.0000 ug | INTRAVENOUS | Status: DC | PRN
  Filled 2017-09-25: qty 10

## 2017-09-25 MED ORDER — MAGNESIUM SULFATE 2 GM/50ML IV SOLN
2.0000 g | Freq: Once | INTRAVENOUS | Status: AC
  Administered 2017-09-25: 2 g via INTRAVENOUS
  Filled 2017-09-25: qty 50

## 2017-09-25 MED ORDER — ORAL CARE MOUTH RINSE
15.0000 mL | Freq: Two times a day (BID) | OROMUCOSAL | Status: DC
  Administered 2017-09-25 – 2017-10-06 (×18): 15 mL via OROMUCOSAL

## 2017-09-25 MED ORDER — ADULT MULTIVITAMIN LIQUID CH
15.0000 mL | Freq: Every day | ORAL | Status: DC
  Administered 2017-09-27 – 2017-09-28 (×2): 15 mL
  Filled 2017-09-25 (×3): qty 15

## 2017-09-25 MED ORDER — APIXABAN 5 MG PO TABS
5.0000 mg | ORAL_TABLET | Freq: Two times a day (BID) | ORAL | Status: DC
  Administered 2017-09-25 – 2017-09-28 (×8): 5 mg
  Filled 2017-09-25 (×9): qty 1

## 2017-09-25 MED ORDER — ONDANSETRON HCL 4 MG/2ML IJ SOLN
INTRAMUSCULAR | Status: AC
Start: 2017-09-25 — End: 2017-09-26
  Filled 2017-09-25: qty 2

## 2017-09-25 MED ORDER — FUROSEMIDE 10 MG/ML IJ SOLN
20.0000 mg | Freq: Once | INTRAMUSCULAR | Status: AC
Start: 2017-09-25 — End: 2017-09-25
  Administered 2017-09-25: 20 mg via INTRAVENOUS
  Filled 2017-09-25: qty 2

## 2017-09-25 MED ORDER — ALBUMIN HUMAN 25 % IV SOLN
12.5000 g | Freq: Once | INTRAVENOUS | Status: AC
  Administered 2017-09-25: 12.5 g via INTRAVENOUS
  Filled 2017-09-25 (×2): qty 50

## 2017-09-25 MED ORDER — ALBUMIN HUMAN 5 % IV SOLN
25.0000 g | Freq: Once | INTRAVENOUS | Status: DC
  Filled 2017-09-25 (×2): qty 500

## 2017-09-25 MED ORDER — METOPROLOL TARTRATE 5 MG/5ML IV SOLN
2.5000 mg | Freq: Once | INTRAVENOUS | Status: AC
  Administered 2017-09-25: 2.5 mg via INTRAVENOUS
  Filled 2017-09-25: qty 5

## 2017-09-25 NOTE — Progress Notes (Signed)
PULMONARY / CRITICAL CARE MEDICINE   Name: Nina Hudson MRN: [id number] DOB: [birth year]    ADMISSION DATE:  09/23/2017 CONSULTATION DATE: 09/23/2017  REFERRING MD: Dr. Posey Pronto   CHIEF COMPLAINT: Unresponsiveness   HISTORY OF PRESENT ILLNESS:   This is a 72 yo female with a PMH of Stroke, HTN, Rheumatoid Arthritis,  Hypercholesteremia, Dysrhythmia, Diabetes Mellitus, Uterine Cancer, Diffuse Large B Cell Lymphoma, Atrial Fibrillation (previously on eliquis held due to recent surgery), Chronic Diastolic CHF (EF 55 to 56% on 09/08/17) , Depression, and Arthritis.  She presented to [facility] ER on 06/19 via EMS from the [place] after being found minimally responsive with rapid respirations and increased heart rate. The nurse practitioner at the [facility] spoke with pts husband regarding change in pt condition, and her concern of possible pneumonia.  She discussed plan of care options with pts husband and he decided he wanted pt transported to [facility] for aggressive treatment.  She recently had a complicated hospitalization at [facility] from 31/07/9700-63/78/5885 for complicated cholecystitis with cholecystectomy. She underwent an ERCP with metal stent placement and stone extraction with lap chole.  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 She was discharged from [facility] on 09/18/17 to the [facility] for rehab.  According to pts daughter since discharge from [facility] she has had poor po intake, c/o weakness/fatigue, and unable to participate in rehab. Upon arrival to [facility] ER she was hypoxic O2 sats 80's on 4L O2 with agonal respirations requiring mechanical intubation.  Pt found to be guaiac positive with black stool with hgb 8.4, therefore she received 1 unit of pRBC's.  CXR concerning for pneumonia vs.  pulmonary edema and lactic acid 7.5, she received 1L fluid bolus and iv abx.  She was subsequently admitted to ICU by hospitalist team for further workup and treatment.      REVIEW OF SYSTEMS:   Unable to assess pt intubated  SUBJECTIVE:  Unable to assess pt intubated   VITAL SIGNS: BP 105/75   Pulse (!) 127   Temp 99.3 F (37.4 C) (Oral)   Resp 15   Ht 5\' 5"  (1.651 m)   Wt 129 lb 6.6 oz (58.7 kg)   SpO2 100%   BMI 21.53 kg/m   HEMODYNAMICS:  no compromise  VENTILATOR SETTINGS: Vent Mode: PRVC FiO2 (%):  [40 %] 40 % Set Rate:  [15 bmp] 15 bmp Vt Set:  [450 mL] 450 mL PEEP:  [5 cmH20] 5 cmH20  INTAKE / OUTPUT: I/O last 3 completed shifts: In: 4183.6 [I.V.:2799.5; NG/GT:723; IV Piggyback:661.1] Out: 1510 [Urine:1390; Emesis/NG output:120]  PHYSICAL EXAMINATION: General: comfortable on  mechanically ventilator Neuro: sedated but followed simple commands as per nurse, PERRL  HEENT: supple, no JVD Cardiovascular: irregular irregular, no R/G Lungs: ghood A/E bilateral, no rales or rhonchi Abdomen: RUQ incision site with steri strips in place with dried blood and ecchymosis with small hematoma, +BS x4, soft, non distended, tender  Musculoskeletal: 2+ bilateral lower extremity edema Skin: RUQ incision site   LABS:  BMET Recent Labs  Lab 09/24/17 0540 09/24/17 2336 09/25/17 0513  NA 143 139 140  K 3.2* 3.5 3.7  CL 101 95* 97*  CO2 34* 33* 33*  BUN 19 17 17   CREATININE 0.78 0.76 0.65  GLUCOSE 175* 252* 199*    Electrolytes  Recent Labs  Lab 09/23/17 2128  09/24/17 0540 09/24/17 2336 09/25/17 0513  CALCIUM  --    < > 7.8* 7.6* 7.6*  MG 1.9  --  1.8 1.7  --   PHOS 2.3*  --  1.6* 3.4  --    < > = values in this interval not displayed.    CBC Recent Labs  Lab 09/21/17 1254 09/23/17 1323  09/24/17 0540 09/24/17 1715 09/24/17 2241  WBC 5.6 6.8  --  6.6  --   --   HGB 9.0* 8.4*   < > 9.4*  9.7* 9.9* 11.5*  HCT 27.8* 26.1*   < > 29.0*  29.0* 29.8*  33.6*  PLT 141* 163  --  154  --   --    < > = values in this interval not displayed.    Coag's Recent Labs  Lab 09/23/17 1323  APTT 32  INR 2.50    Sepsis Markers Recent Labs  Lab 09/23/17 1837  09/24/17 1010 09/24/17 1425 09/25/17 0513  LATICACIDVEN  --    < > 2.6* 2.6* 2.7*  PROCALCITON 0.19  --   --   --   --    < > = values in this interval not displayed.    ABG Recent Labs  Lab 09/23/17 1321 09/25/17 0400  PHART 7.49* 7.47*  PCO2ART 48 55*  PO2ART 352* 122*    Liver Enzymes Recent Labs  Lab 09/21/17 1254 09/23/17 1323 09/25/17 0513  AST 30 39 32  ALT 24 24 18   ALKPHOS 145* 132* 111  BILITOT 2.2* 2.2* 1.7*  ALBUMIN 2.8* 2.9* 2.3*    Cardiac Enzymes Recent Labs  Lab 09/23/17 1323 09/23/17 2128  TROPONINI 0.05* 0.04*    Glucose Recent Labs  Lab 09/24/17 1112 09/24/17 1512 09/24/17 1923 09/24/17 2330 09/25/17 0340 09/25/17 0757  GLUCAP 172* 144* 123* 117* 184* 199*    Imaging Ct Abdomen Pelvis Wo Contrast  Result Date: 09/24/2017 CLINICAL DATA:  S/P ERCP and stent for stone removal. Now with persistent lactic acidosis. PO contrast only pe chest done yesterday. EXAM: CT ABDOMEN AND PELVIS WITHOUT CONTRAST TECHNIQUE: Multidetector CT imaging of the abdomen and pelvis was performed following the standard protocol without IV contrast. COMPARISON:  CT chest from previous day, PET-CT 11/16/2012, and earlier studies FINDINGS: Lower chest: Bilateral pleural effusions right greater than left as before. Adjacent consolidation/atelectasis posteriorly in the lower lobes. Scattered subpleural patchy somewhat nodular opacities in the visualized lung bases as before. Hepatobiliary: Metallic CBD stent. Gas in the intrahepatic biliary tree implying stent patency. Cholecystectomy clips. No discrete liver lesion. Pancreas: Scattered coarse calcifications in the pancreatic head near the stent. No mass or ductal dilatation. Spleen: Normal in size without focal  abnormality. Adrenals/Urinary Tract: Unremarkable adrenals. Residual contrast in the renal collecting systems without hydronephrosis. No focal renal lesion is evident. Foley catheter partially decompresses the urinary bladder. Stomach/Bowel: Stomach is nondilated. Incomplete distal passage of oral contrast material. Small bowel grossly unremarkable. Multiple sigmoid diverticula. Fecal material distends the rectum. There are mild perirectal and presacral edematous/inflammatory changes. Vascular/Lymphatic: Aortoiliac atherosclerosis (ICD10-170.0). Infrarenal aorta 2.6 cm diameter. Tortuous venous collateral channels in the left retroperitoneum associated with the left renal vein, present on studies dating back to 05/22/2007. No abdominal or pelvic adenopathy localized. Reproductive: Status post hysterectomy. No adnexal masses. Other: There is a small amount of high attenuation perihepatic and pelvic ascites. No free air. Musculoskeletal: Superior endplate compression deformities of T11, L1, L2, and L3, age indeterminate.  No definite acute fracture or worrisome bone lesion. IMPRESSION: 1. Small amount of perihepatic and pelvic ascites. Attenuation above simple fluid suggests blood, contrast material, or proteinaceous component. 2. Stable placement of patent metallic biliary stent. 3. Fecal distention of the rectum with regional inflammatory/edematous changes, suggesting possible stercoral proctitis. 4. Sigmoid diverticulosis. 5. Lower thoracic and lumbar mild compression deformities, age indeterminate 6. Ectatic abdominal aorta at risk for aneurysm development. Recommend followup by ultrasound in 5 years. This recommendation follows ACR consensus guidelines: White Paper of the ACR Incidental Findings Committee II on Vascular Findings. J Am Coll Radiol 2013; 10:789-794. Electronically Signed   By: Lucrezia Europe M.D.   On: 09/24/2017 12:07   Dg Chest Port 1 View  Result Date: 09/25/2017 CLINICAL DATA:  Respiratory  failure. EXAM: PORTABLE CHEST 1 VIEW COMPARISON:  09/24/2017, 09/23/2017.  CT 09/23/2017. FINDINGS: Endotracheal tube and NG tube in stable position. Heart size normal. Mild bibasilar atelectasis. Interim improvement of previously identified small bilateral pleural effusions. No pneumothorax. IMPRESSION: 1.  Lines and tubes in stable position. 2. Mild bibasilar atelectasis. Interim improvement of previously identified small bilateral pleural effusions. Electronically Signed   By: Marcello Moores  Register   On: 09/25/2017 07:14   STUDIES/SIGNIFICANT EVENTS: 06/19 CT Head revealed no acute abnormality. Atrophy and chronic microvascular ischemic change. Sinus disease.  CULTURES: Blood x2 06/19>> Respiratory 06/19>> Urine 06/19>>  ANTIBIOTICS: Zosyn 06/19>> Vancomycin 06/19 x1 dose   LINES/TUBES: ETT 06/19>>  ASSESSMENT / PLAN:  PULMONARY A: Acute hypoxic respiratory failure secondary to pneumonia vs. pulmonary edema  Bilateral pleural effusions stable Mechanical Intubation  P:   SBT as tolerated Prn bronchodilator therapy  VAP bundle implemented  CTA Chest has ruled out PE  CARDIOVASCULAR A:  Demand ischemia  Intermittent Vfibb/Vtach no further episodes ( A-Fib with aberrant?) Chronic Atrial Fibrillation with RVR- better controlled now  P:  Continuous telemetry monitoring  Trend troponin's Maintain map >65 Continue amiodarone gtt  Will restart patient's home med- Cardizem for HR control  RENAL A:   Lactic acidosis trending down this AM P:   Trend BMP and lactic acid  Replace electrolytes as indicated  CT abdomen and Pelvis results noted Monitor UOP   GASTROINTESTINAL A:   Hx Gastrointestinal Bleeding  Hx: Cholecystitis s/p ERCP with stone removal and metal stent placement along with lap chole complicated by hemoperitoneum secondary to cystic artery bleed requiring ligation-09/09/17 P:   Continue TF but hold for  SBT SUP px: IV protonix   HEMATOLOGIC A:   Hx Acute  blood loss anemia  P:  Serial H&H Monitor for s/sx of bleeding  Transfuse for hgb <7 and/or active bleeding  VTE px: SCD's,  outpatient eliquis restarted If she shows active signs of bleeding will consult GI and order GI Bleeding Scan  INFECTIOUS A:   Pneumonia cultures negative thus far P:   Trend WBC and monitor fever curve Trend PCT Follow cultures Continue abx as listed above  ENDOCRINE A:   Diabetes Mellitus  P:   CBG's q4hrs  SSI for target Glucose management  NEUROLOGIC A:   Acute metabolic encephalopathy likely secondary to hypoxia - clinically improving Mechanical Intubation Discomfort/Pain  P:   D/C sedatives SBT for extubation   FAMILY  - Updates: Spoke with pts daughter regarding place of care and all questions answered  I have dedicated a total of 38 minutes in critical care time minus all appropriate exclusions.  Cammie Sickle, MD  Pulmonary/Critical Care Pager [phone] (please enter 7 digits) PCCM  Consult Pager [phone] (please enter 7 digits)

## 2017-09-25 NOTE — Progress Notes (Signed)
Pharmacy Electrolyte Monitoring Consult:  Pharmacy consulted to assist in monitoring and replacing electrolytes in this 72 y.o. female admitted on 09/23/2017 with Unresponsive  Patient received potassium phosphate 36mmol IV x 1 on 6/21.   Labs:  Sodium (mmol/L)  Date Value  09/25/2017 140  04/11/2014 138   Potassium (mmol/L)  Date Value  09/25/2017 3.7  04/11/2014 3.9   Magnesium (mg/dL)  Date Value  09/24/2017 1.7  06/07/2012 1.8   Phosphorus (mg/dL)  Date Value  09/24/2017 3.4  01/16/2012 3.0   Calcium (mg/dL)  Date Value  09/25/2017 7.6 (L)   Calcium, Total (mg/dL)  Date Value  04/11/2014 8.1 (L)   Albumin (g/dL)  Date Value  09/25/2017 2.3 (L)  03/01/2013 3.4    Assessment/Plan: Magnesium 2g IV x 1. Will recheck electrolytes with am labs.   Pharmacy will continue to monitor and adjust per consult.   Simpson,Michael L 09/25/2017 4:25 PM

## 2017-09-25 NOTE — Clinical Social Work Note (Signed)
CSW attempted to reach patient's husband Nina Hudson: [phone]) this afternoon. A woman answered the phone and Nina Hudson was sitting beside the woman who answered. When CSW asked to speak with him, the woman let him know who I was and he asked "tell her to call me back in a little over an hour." CSW informed the woman that this may not be possible and if not, the weekend CSW will attempt to reach him further. Patient has been at [facility] for short term rehab. Lovena Le at [facility] has stated that they can take patient back at discharge. Patient was admitted to [facility] from [facility]. Patient is currently on a vent. Shela Leff MSW,LCSW [phone]

## 2017-09-25 NOTE — Progress Notes (Signed)
Patient extubated and placed on 4lpm [place].  Patient tolerated well.  HR 120 RR 18 oxygen saturations 100%.

## 2017-09-25 NOTE — Progress Notes (Signed)
Pharmacy Antibiotic Note  Nina Hudson is a 72 y.o. female admitted on 09/23/2017 with pneumonia.  Pharmacy has been consulted for Zosyn dosing.  Plan: Continue Zosyn EI 3.375g IV Q8hr.   Height: 5\' 5"  (165.1 cm) Weight: 129 lb 6.6 oz (58.7 kg) IBW/kg (Calculated) : 57  Temp (24hrs), Avg:99 F (37.2 C), Min:98.6 F (37 C), Max:99.3 F (37.4 C)  Recent Labs  Lab 09/21/17 1254 09/23/17 1323  09/23/17 2306 09/24/17 0148 09/24/17 0540 09/24/17 1010 09/24/17 1425 09/24/17 2336 09/25/17 0513  WBC 5.6 6.8  --   --   --  6.6  --   --   --   --   CREATININE 0.70 0.70  --   --  0.71 0.78  --   --  0.76 0.65  LATICACIDVEN  --   --    < > 4.1* 4.0*  --  2.6* 2.6*  --  2.7*   < > = values in this interval not displayed.    Estimated Creatinine Clearance: 56.4 mL/min (by C-G formula based on SCr of 0.65 mg/dL).    Allergies  Allergen Reactions  . Sulfa Antibiotics Other (See Comments)    "I get real achey" Other reaction(s): Muscle Pain, Other (See Comments)  General body aches andpain.     Antimicrobials this admission: Vancomycin 6/19 x 1  Zosyn 6/19 >>   Dose adjustments this admission: N/A  Microbiology results: 6/19 BCx: no growth x 2 days  6/19 UCx: no growth  6/19 Sputum: reincubated for better growth  6/19 MRSA PCR: negative   Thank you for allowing pharmacy to be a part of this patient's care.  Simpson,Michael L 09/25/2017 4:27 PM

## 2017-09-25 NOTE — Progress Notes (Signed)
[place] at [facility]                                                                                                                                                                                  Patient Demographics   Nina Hudson, is a 72 y.o. female, DOB - 09/13/1945, UYQ:[id number]  Admit date - 09/23/2017   Admitting Physician Fritzi Mandes, MD  Outpatient Primary MD for the patient is Derinda Late, MD   LOS - 2  Subjective: Patient is intubated and sedated    Review of Systems:   CONSTITUTIONAL: Intubated  Vitals:   Vitals:   09/25/17 1200 09/25/17 1300 09/25/17 1400 09/25/17 1500  BP: 125/86 115/70 127/75 123/66  Pulse: (!) 137 (!) 119 (!) 120 (!) 132  Resp: 14 11 19 18   Temp: 99.3 F (37.4 C)     TempSrc: Oral     SpO2: 99% 100% 100% 99%  Weight:      Height:        Wt Readings from Last 3 Encounters:  09/23/17 58.7 kg (129 lb 6.6 oz)  08/09/17 54.4 kg (120 lb)  09/28/15 55.8 kg (123 lb)     Intake/Output Summary (Last 24 hours) at 09/25/2017 1550 Last data filed at 09/25/2017 1503 Gross per 24 hour  Intake 1850 ml  Output 690 ml  Net 1160 ml    Physical Exam:   GENERAL: Critically ill-appearing HEAD, EYES, EARS, NOSE AND THROAT: Atraumatic, normocephalic. Extraocular muscles are intact. Pupils equal and reactive to light. Sclerae anicteric. No conjunctival injection. No oro-pharyngeal erythema.  NECK: Supple. There is no jugular venous distention. No bruits, no lymphadenopathy, no thyromegaly.  HEART: Irregularly irregular. No murmurs, no rubs, no clicks.  LUNGS: Clear to auscultation bilaterally. No rales or rhonchi. No wheezes.  ABDOMEN: Soft, flat, nontender, nondistended. Has good bowel sounds. No hepatosplenomegaly appreciated.  EXTREMITIES: No evidence of any cyanosis, clubbing, or peripheral edema.  +2 pedal and radial pulses bilaterally.  NEUROLOGIC:  sedated  sKIN: Moist and warm with no rashes appreciated.   Psych: Sedated  LN: No inguinal LN enlargement    Antibiotics   Anti-infectives (From admission, onward)   Start     Dose/Rate Route Frequency Ordered Stop   09/23/17 2200  piperacillin-tazobactam (ZOSYN) IVPB 3.375 g     3.375 g 12.5 mL/hr over 240 Minutes Intravenous Every 8 hours 09/23/17 1935     09/23/17 1400  vancomycin (VANCOCIN) IVPB 1000 mg/200 mL premix     1,000 mg 200 mL/hr over 60 Minutes Intravenous  Once 09/23/17 1348 09/23/17 1530   09/23/17 1400  piperacillin-tazobactam (ZOSYN) IVPB 3.375 g  3.375 g 100 mL/hr over 30 Minutes Intravenous  Once 09/23/17 1348 09/23/17 1430      Medications   Scheduled Meds: . apixaban  5 mg Per Tube BID  . diltiazem  30 mg Oral Q6H  . feeding supplement (PRO-STAT SUGAR FREE 64)  30 mL Per Tube BID  . fentaNYL (SUBLIMAZE) injection  50 mcg Intravenous Once  . furosemide  20 mg Intravenous Once  . insulin aspart  0-15 Units Subcutaneous Q4H  . mouth rinse  15 mL Mouth Rinse BID  . multivitamin  15 mL Per Tube Daily  . multivitamin-lutein  1 capsule Per Tube Daily  . ondansetron      . pantoprazole (PROTONIX) IV  40 mg Intravenous Q12H   Continuous Infusions: . albumin human    . amiodarone 30 mg/hr (09/25/17 1400)  . feeding supplement (VITAL 1.5 CAL) 1,000 mL (09/25/17 1405)  . piperacillin-tazobactam (ZOSYN)  IV 3.375 g (09/25/17 1503)   PRN Meds:.acetaminophen **OR** acetaminophen, ipratropium-albuterol, sennosides   Data Review:   Micro Results Recent Results (from the past 240 hour(s))  Blood Culture (routine x 2)     Status: None (Preliminary result)   Collection Time: 09/23/17  1:26 PM  Result Value Ref Range Status   Specimen Description BLOOD LEFT ANTECUBITAL  Final   Special Requests   Final    BOTTLES DRAWN AEROBIC AND ANAEROBIC Blood Culture results may not be optimal due to an excessive volume of blood received in culture bottles   Culture   Final    NO GROWTH 2 DAYS Performed at [facility], [address] [place], [place] [address]    Report Status PENDING  Incomplete  Blood Culture (routine x 2)     Status: None (Preliminary result)   Collection Time: 09/23/17  1:27 PM  Result Value Ref Range Status   Specimen Description BLOOD RIGHT ANTECUBITAL  Final   Special Requests   Final    BOTTLES DRAWN AEROBIC AND ANAEROBIC Blood Culture adequate volume   Culture   Final    NO GROWTH 2 DAYS Performed at [facility], [address] [place], [place] [address]    Report Status PENDING  Incomplete  Urine culture     Status: None   Collection Time: 09/23/17  1:27 PM  Result Value Ref Range Status   Specimen Description   Final    URINE, RANDOM Performed at [facility], [address] [place], [place] [address]    Special Requests   Final    NONE Performed at [facility], [address], [place], [place] [address]    Culture   Final    NO GROWTH Performed at [facility], [place] [address], [place], [place] [address]    Report Status 09/24/2017 FINAL  Final  Culture, respiratory (NON-Expectorated)     Status: None (Preliminary result)   Collection Time: 09/23/17  2:11 PM  Result Value Ref Range Status   Specimen Description   Final    TRACHEAL ASPIRATE Performed at [facility], [address] [place], [place] [address]    Special Requests   Final    NONE Performed at [facility], [address] [place],  [address]    Gram Stain   Final    ABUNDANT WBC PRESENT, PREDOMINANTLY PMN FEW GRAM POSITIVE COCCI RARE GRAM POSITIVE RODS RARE GRAM NEGATIVE COCCOBACILLI    Culture   Final    CULTURE REINCUBATED FOR BETTER  GROWTH Performed at [facility], [place] [address], [place], [place] [address]    Report Status PENDING  Incomplete  MRSA PCR Screening     Status: None   Collection Time: 09/23/17  6:17 PM  Result Value Ref Range Status   MRSA by PCR NEGATIVE NEGATIVE Final    Comment:         The GeneXpert MRSA Assay (FDA approved for NASAL specimens only), is one component of a comprehensive MRSA colonization surveillance program. It is not intended to diagnose MRSA infection nor to guide or monitor treatment for MRSA infections. Performed at [facility], [address] [place], [place] [address]   Culture, respiratory (NON-Expectorated)     Status: None (Preliminary result)   Collection Time: 09/23/17  8:59 PM  Result Value Ref Range Status   Specimen Description   Final    TRACHEAL ASPIRATE Performed at [facility], [address], [place], [place] [address]    Special Requests   Final    Normal Performed at [facility], [address] [place], [place] [address]    Gram Stain   Final    FEW WBC PRESENT, PREDOMINANTLY PMN RARE GRAM POSITIVE COCCI    Culture   Final    CULTURE REINCUBATED FOR BETTER GROWTH Performed at [facility], [place] [address], [place], [place] [address]    Report Status PENDING  Incomplete    Radiology Reports Ct Abdomen Pelvis Wo Contrast  Result Date: 09/24/2017 CLINICAL DATA:  S/P ERCP and stent for stone removal. Now with persistent lactic acidosis. PO contrast only pe chest done yesterday. EXAM: CT ABDOMEN AND PELVIS WITHOUT CONTRAST TECHNIQUE: Multidetector CT imaging of the abdomen and pelvis was performed following the standard protocol without IV contrast. COMPARISON:  CT chest from previous day, PET-CT 11/16/2012, and earlier studies FINDINGS: Lower chest: Bilateral pleural effusions right greater than left as before. Adjacent consolidation/atelectasis posteriorly in the lower lobes. Scattered subpleural patchy somewhat nodular opacities in the visualized lung bases as before. Hepatobiliary: Metallic CBD stent. Gas in the intrahepatic biliary tree implying stent patency. Cholecystectomy clips. No discrete liver lesion. Pancreas: Scattered coarse calcifications in the pancreatic head near the  stent. No mass or ductal dilatation. Spleen: Normal in size without focal abnormality. Adrenals/Urinary Tract: Unremarkable adrenals. Residual contrast in the renal collecting systems without hydronephrosis. No focal renal lesion is evident. Foley catheter partially decompresses the urinary bladder. Stomach/Bowel: Stomach is nondilated. Incomplete distal passage of oral contrast material. Small bowel grossly unremarkable. Multiple sigmoid diverticula. Fecal material distends the rectum. There are mild perirectal and presacral edematous/inflammatory changes. Vascular/Lymphatic: Aortoiliac atherosclerosis (ICD10-170.0). Infrarenal aorta 2.6 cm diameter. Tortuous venous collateral channels in the left retroperitoneum associated with the left renal vein, present on studies dating back to 05/22/2007. No abdominal or pelvic adenopathy localized. Reproductive: Status post hysterectomy. No adnexal masses. Other: There is a small amount of high attenuation perihepatic and pelvic ascites. No free air. Musculoskeletal: Superior endplate compression deformities of T11, L1, L2, and L3, age indeterminate. No definite acute fracture or worrisome bone lesion. IMPRESSION: 1. Small amount of perihepatic and pelvic ascites. Attenuation above simple fluid suggests blood, contrast material, or proteinaceous component. 2. Stable placement of patent metallic biliary stent. 3. Fecal distention of the rectum with regional inflammatory/edematous changes, suggesting possible stercoral proctitis. 4. Sigmoid diverticulosis. 5. Lower thoracic and lumbar mild compression deformities, age indeterminate 6. Ectatic abdominal aorta at risk for aneurysm development. Recommend followup by ultrasound in  5 years. This recommendation follows ACR consensus guidelines: White Paper of the ACR Incidental Findings Committee II on Vascular Findings. J Am Coll Radiol 2013; 10:789-794. Electronically Signed   By: Lucrezia Europe M.D.   On: 09/24/2017 12:07   Dg Abd  1 View  Result Date: 09/23/2017 CLINICAL DATA:  72 year old female NG tube placement. EXAM: ABDOMEN - 1 VIEW COMPARISON:  Portable chest 1310 hours today. FINDINGS: Portable AP semi upright view at 1930 hours. Enteric tube courses to the left upper quadrant, tip at the level of the gastric body. The side hole is at the level of the gastroesophageal junction and could be advanced 5 centimeters for more optimal placement. Stable lung bases. Negative visible bowel gas pattern. There is a midline metallic stent projecting over the lower lumbar spine. No acute osseous abnormality identified. IMPRESSION: NG tube tip in the proximal stomach. Advance the tube 5 centimeters to ensure side hole placement inside the stomach. Electronically Signed   By: Genevie Ann M.D.   On: 09/23/2017 19:59   Ct Head Wo Contrast  Result Date: 09/23/2017 CLINICAL DATA:  Patient found unresponsive yesterday. EXAM: CT HEAD WITHOUT CONTRAST TECHNIQUE: Contiguous axial images were obtained from the base of the skull through the vertex without intravenous contrast. COMPARISON:  Brain MRI 08/21/2017. FINDINGS: Brain: No evidence of acute infarction, hemorrhage, hydrocephalus, extra-axial collection or mass lesion/mass effect. Cortical atrophy and chronic microvascular ischemic change are noted. Remote lacunar infarction left caudate head is identified. Vascular: No hyperdense vessel or unexpected calcification. Skull: Intact.  No focal lesion. Sinuses/Orbits: There is fairly extensive ethmoid air cell disease and marked mucosal thickening in the left maxillary sinus. Mild mucosal thickening right sphenoid sinus is present. The left frontal sinus is opacified. Other: None. IMPRESSION: No acute abnormality. Atrophy and chronic microvascular ischemic change. Sinus disease. Electronically Signed   By: Inge Rise M.D.   On: 09/23/2017 14:30   Ct Angio Chest Pe W Or Wo Contrast  Result Date: 09/23/2017 CLINICAL DATA:  Acute respiratory  failure. Recent exploratory laparotomy and cholecystectomy. History of lymphoma and uterine cancer. EXAM: CT ANGIOGRAPHY CHEST WITH CONTRAST TECHNIQUE: Multidetector CT imaging of the chest was performed using the standard protocol during bolus administration of intravenous contrast. Multiplanar CT image reconstructions and MIPs were obtained to evaluate the vascular anatomy. CONTRAST:  40mL ISOVUE-370 IOPAMIDOL (ISOVUE-370) INJECTION 76% COMPARISON:  Chest radiograph September 23, 2017 FINDINGS: CARDIOVASCULAR: Adequate contrast opacification of the pulmonary artery's. Main pulmonary artery is not enlarged. No pulmonary arterial filling defects to the level of the subsegmental branches. Heart size is normal, no right heart strain. Mild coronary artery calcifications. Trace pericardial effusion. Thoracic aorta is normal course and caliber, mild calcific atherosclerosis. MEDIASTINUM/NODES: No lymphadenopathy by CT size criteria. LUNGS/PLEURA: Moderate RIGHT and small LEFT pleural effusions. Bilateral lower lobe compressive atelectasis versus pneumonia. Scattered clusters of bilateral tree-in-bud infiltrates. Bronchial wall thickening. Endotracheal tube tip above the carina. Mild centrilobular emphysema. No pneumothorax. UPPER ABDOMEN: Partially imaged biliary drain with pneumobilia and debris within surgical bed. MUSCULOSKELETAL: Thickened RIGHT platysma and subcutaneous fat stranding incompletely imaged. Mild T11, mild L1 and mild-to-moderate L2 old compression fractures. Mild spondylosis. Review of the MIP images confirms the above findings. IMPRESSION: 1. Moderate RIGHT and small LEFT pleural effusions. Bibasilar atelectasis and/or pneumonia. 2. Bronchial wall thickening seen with bronchitis. Scattered tree-in-bud infiltrates may be infectious or inflammatory. 3. Partially imaged biliary stent with pneumobilia and debris within gallbladder fossa. Aortic Atherosclerosis (ICD10-I70.0) and Emphysema (ICD10-J43.9).  Electronically Signed  By: Elon Alas M.D.   On: 09/23/2017 22:10   Dg Chest Port 1 View  Result Date: 09/25/2017 CLINICAL DATA:  Respiratory failure. EXAM: PORTABLE CHEST 1 VIEW COMPARISON:  09/24/2017, 09/23/2017.  CT 09/23/2017. FINDINGS: Endotracheal tube and NG tube in stable position. Heart size normal. Mild bibasilar atelectasis. Interim improvement of previously identified small bilateral pleural effusions. No pneumothorax. IMPRESSION: 1.  Lines and tubes in stable position. 2. Mild bibasilar atelectasis. Interim improvement of previously identified small bilateral pleural effusions. Electronically Signed   By: Marcello Moores  Register   On: 09/25/2017 07:14   Dg Chest Port 1 View  Result Date: 09/24/2017 CLINICAL DATA:  Acute respiratory failure EXAM: PORTABLE CHEST 1 VIEW COMPARISON:  Yesterday FINDINGS: Endotracheal tube tip between the clavicular heads and carina. An orogastric tube reaches the stomach. Artifact from EKG leads. Layering pleural effusions with atelectasis. There is hyperinflation. Normal heart size. IMPRESSION: 1. Unremarkable positioning of endotracheal and orogastric tubes. 2. Small bilateral layering pleural effusion. 3. Probable COPD. Electronically Signed   By: Monte Fantasia M.D.   On: 09/24/2017 07:55   Dg Chest Port 1 View  Result Date: 09/23/2017 CLINICAL DATA:  Tube placement. EXAM: PORTABLE CHEST 1 VIEW COMPARISON:  Radiographs of April 10, 2014. FINDINGS: Stable cardiomediastinal silhouette. Interval placement of endotracheal tube with distal tip 6 cm above the carina. No pneumothorax is noted. Mild bibasilar subsegmental atelectasis or edema is noted with small pleural effusions. Bony thorax is unremarkable. IMPRESSION: Endotracheal tube in grossly good position. Nasogastric tube is seen entering stomach. Mild bibasilar subsegmental atelectasis or edema is noted with small pleural effusions. Electronically Signed   By: Marijo Conception, M.D.   On: 09/23/2017  13:55   Dg Abd Portable 1v  Result Date: 09/25/2017 CLINICAL DATA:  Encounter for nasogastric tube placement EXAM: PORTABLE ABDOMEN - 1 VIEW COMPARISON:  Chest x-ray from earlier today FINDINGS: A nasogastric tube has been replaced by a feeding tube with tip at the fundus. Haziness at the bases from atelectasis and pleural fluid based on prior abdominal CT. Metallic biliary stent in place. Previously seen oral contrast has reached the colon. IMPRESSION: When compared to chest x-ray earlier today the orogastric tube has been replaced by a feeding tube with tip at the gastric fundus. Electronically Signed   By: Monte Fantasia M.D.   On: 09/25/2017 13:22     CBC Recent Labs  Lab 09/21/17 1254 09/23/17 1323 09/23/17 2306 09/24/17 0540 09/24/17 1715 09/24/17 2241  WBC 5.6 6.8  --  6.6  --   --   HGB 9.0* 8.4* 9.7* 9.4*  9.7* 9.9* 11.5*  HCT 27.8* 26.1* 29.3* 29.0*  29.0* 29.8* 33.6*  PLT 141* 163  --  154  --   --   MCV 94.7 96.5  --  92.1  --   --   MCH 30.6 31.1  --  30.8  --   --   MCHC 32.3 32.3  --  33.4  --   --   RDW 20.9* 21.9*  --  20.8*  --   --   LYMPHSABS 1.0 1.2  --   --   --   --   MONOABS 0.6 0.4  --   --   --   --   EOSABS 0.0 0.0  --   --   --   --   BASOSABS 0.0 0.0  --   --   --   --     Chemistries  Recent  Labs  Lab 09/21/17 1254 09/23/17 1323 09/23/17 2128 09/24/17 0148 09/24/17 0540 09/24/17 2336 09/25/17 0513  NA 142 142  --  142 143 139 140  K 3.2* 4.8 6.3* 3.1* 3.2* 3.5 3.7  CL 96* 96*  --  100* 101 95* 97*  CO2 35* 35*  --  33* 34* 33* 33*  GLUCOSE 256* 313*  --  285* 175* 252* 199*  BUN 20 20  --  21* 19 17 17   CREATININE 0.70 0.70  --  0.71 0.78 0.76 0.65  CALCIUM 8.5* 8.2*  --  8.0* 7.8* 7.6* 7.6*  MG 2.0  --  1.9  --  1.8 1.7  --   AST 30 39  --   --   --   --  32  ALT 24 24  --   --   --   --  18  ALKPHOS 145* 132*  --   --   --   --  111  BILITOT 2.2* 2.2*  --   --   --   --  1.7*    ------------------------------------------------------------------------------------------------------------------ estimated creatinine clearance is 56.4 mL/min (by C-G formula based on SCr of 0.65 mg/dL). ------------------------------------------------------------------------------------------------------------------ No results for input(s): HGBA1C in the last 72 hours. ------------------------------------------------------------------------------------------------------------------ No results for input(s): CHOL, HDL, LDLCALC, TRIG, CHOLHDL, LDLDIRECT in the last 72 hours. ------------------------------------------------------------------------------------------------------------------ No results for input(s): TSH, T4TOTAL, T3FREE, THYROIDAB in the last 72 hours.  Invalid input(s): FREET3 ------------------------------------------------------------------------------------------------------------------ No results for input(s): VITAMINB12, FOLATE, FERRITIN, TIBC, IRON, RETICCTPCT in the last 72 hours.  Coagulation profile Recent Labs  Lab 09/23/17 1323  INR 2.50    No results for input(s): DDIMER in the last 72 hours.  Cardiac Enzymes Recent Labs  Lab 09/23/17 1323 09/23/17 2128  TROPONINI 0.05* 0.04*   ------------------------------------------------------------------------------------------------------------------ Invalid input(s): POCBNP    Assessment & Plan   Nina Hudson  is a 72 y.o. female with a known history of diffuse large B cell lymphoma, uterine cancer, history of a fib on eliquis, diabetes, comes to the emergency room from [facility] where she was admitted on [id number]. Patient is being admitted type for acute on chronic hypoxic respiratory failure secondary to suspected pneumonia/G.I. bleed/acute on chronic anemia.   1. acute on chronic hypoxic respiratory failure secondary to possible right lower lobe pneumonia -PE has been ruled out -Possible pneumonia  positive tracheal aspirate Gram stain continue IV antibiotic Continue IV antibiotic with Zosyn  2.   Acute encephalopathy EEG shows slowing with sleep state Likely due to pneumonia  3. acute on chronic anemia -patient on eliquis -holding it at present -Hemoglobin stable this morning -Status post 1 unit of transfusion -Hemoglobin stable  3. History of ERCP with stone removal and stent placement along with lap Coley complicated by cystic artery bleed requiring exploratory laparotomy -this was done at [facility] on 65 2019 -surgical consultation if needed  4. Diabetes sliding scale insulin for now  5.   A. fib with RVR continue amiodarone drip   DVT prophylaxis SCD I andTed's       Code Status Orders  (From admission, onward)        Start     Ordered   09/23/17 1815  Full code  Continuous     09/23/17 1814    Code Status History    Date Active Date Inactive Code Status Order ID Comments User Context   09/28/2015 0939 09/28/2015 1325 Full Code [id number]  Dereck Leep, MD Inpatient    Advance  Directive Documentation     Most Recent Value  Type of Advance Directive  Living will  Pre-existing out of facility DNR order (yellow form or pink MOST form)  -  "MOST" Form in Place?  -           Consults  intesivist  DVT Prophylaxis  scd's  Lab Results  Component Value Date   PLT 154 09/24/2017     Time Spent in minutes   42min Greater than 50% of time spent in care coordination and counseling patient regarding the condition and plan of care.   Dustin Flock M.D on 09/25/2017 at 3:50 PM  Between 7am to 6pm - Pager - [phone]  After 6pm go to [email or web address] - Proofreader  [employer]   Office  [phone]

## 2017-09-25 NOTE — Progress Notes (Addendum)
Brief Nutrition Follow Up Note   DOCUMENTATION CODES:   Not applicable  INTERVENTION:   Vital 1.5 @ goal rate of 15ml/hr  Prostat liquid protein PO 30 ml BID via tube, each supplement provides 100 kcal, 15 grams protein.  Regimen provides 1460kcal/day, 87g/day protein, 683ml/day   MVI daily via tube as tube feed regimen will not meet micronutrient needs.   Ocuvite daily for wound healing (provides zinc, vitamin A, vitamin C, Vitamin E, copper, and selenium)  Pt likely at high refeeding risk; recommend monitor K, Mg, and P labs  ASSESSMENT:   72 yo female with a PMH of Stroke, HTN, Rheumatoid Arthritis,  Hypercholesteremia, Dysrhythmia, Diabetes Mellitus, Uterine Cancer, Diffuse Large B Cell Lymphoma, Atrial Fibrillation (previously on eliquis held due to recent surgery), Chronic Diastolic CHF (EF 55 to 42% on 09/08/17) , Depression, and Arthritis.  She presented to [facility] ER on 06/19 via EMS from the [facility] after being found minimally responsive with rapid respirations and increased heart rate. She recently had a complicated hospitalization at [facility] from [id number]/6385-56/43/3295 for complicated cholecystitis with cholecystectomy. She underwent an ERCP with metal stent placement and stone extraction with lap chole.  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 She was discharged from [facility] on 09/18/17 to the [facility] for rehab.    Pt initiated on Vital 1.5 overnight at rate of 24ml/hr. Pt to possibly extubate today. Will change tube feed regimen to just continue vital 1.5 and add Prostat. MVI to help pt meet estimated micronutrient needs.   Estimated Nutritional Needs:   Kcal:  1345kcal/day   Protein:  88-100g/day   Fluid:  >1.5L/day   Koleen Distance MS, RD, LDN Pager #-  [phone] Office#- [phone] After Hours Pager: [phone]

## 2017-09-25 NOTE — NC FL2 (Signed)
[place] LEVEL OF CARE SCREENING TOOL     IDENTIFICATION  Patient Name: Nina Hudson Birthdate: 1945-12-17 Sex: female Admission Date (Current Location): 09/23/2017  [facility] and [place] Number:  Engineering geologist and Address:  [facility], [address] [place], [place] [address]      Provider Number: [id number]  Attending Physician Name and Address:  Dustin Flock, MD  Relative Name and Phone Number:       Current Level of Care: Hospital Recommended Level of Care: [place] Prior Approval Number:    Date Approved/Denied:   PASRR Number:    Discharge Plan: SNF    Current Diagnoses: Patient Active Problem List   Diagnosis Date Noted  . Goals of care, counseling/discussion   . Palliative care by specialist   . Pneumonia 09/23/2017  . Altered mental status 09/23/2017  . Acute respiratory failure with hypoxia ([place]) 09/23/2017  . Pressure injury of skin 09/23/2017    Orientation RESPIRATION BLADDER Height & Weight          Continent Weight: 129 lb 6.6 oz (58.7 kg) Height:  5\' 5"  (165.1 cm)  BEHAVIORAL SYMPTOMS/MOOD NEUROLOGICAL BOWEL NUTRITION STATUS  (none)   Continent    AMBULATORY STATUS COMMUNICATION OF NEEDS Skin   Total Care   PU Stage and Appropriate Care                       Personal Care Assistance Level of Assistance  Total care       Total Care Assistance: Maximum assistance   Functional Limitations Info             SPECIAL CARE FACTORS FREQUENCY  PT (By licensed PT)                    Contractures Contractures Info: Not present    Additional Factors Info  Code Status Code Status Info: full             Current Medications (09/25/2017):  This is the current hospital active medication list Current Facility-Administered Medications  Medication Dose Route Frequency Provider Last Rate Last Dose  . acetaminophen (TYLENOL) tablet 650 mg  650 mg Oral Q6H PRN  Fritzi Mandes, MD       Or  . acetaminophen (TYLENOL) suppository 650 mg  650 mg Rectal Q6H PRN Fritzi Mandes, MD      . albumin human 25 % solution 12.5 g  12.5 g Intravenous Once Lafayette Dragon, MD      . amiodarone (NEXTERONE PREMIX) 360-4.14 MG/200ML-% (1.8 mg/mL) IV infusion  30 mg/hr Intravenous Continuous Awilda Bill, NP 16.7 mL/hr at 09/25/17 1200 30 mg/hr at 09/25/17 1200  . apixaban (ELIQUIS) tablet 5 mg  5 mg Per Tube BID Lafayette Dragon, MD   5 mg at 09/25/17 1217  . diltiazem (CARDIZEM) tablet 30 mg  30 mg Oral Q6H Lafayette Dragon, MD   30 mg at 09/25/17 1219  . feeding supplement (PRO-STAT SUGAR FREE 64) liquid 30 mL  30 mL Per Tube BID Lafayette Dragon, MD   30 mL at 09/25/17 1217  . feeding supplement (VITAL 1.5 CAL) liquid 1,000 mL  1,000 mL Per Tube Continuous Lafayette Dragon, MD 35 mL/hr at 09/25/17 1405 1,000 mL at 09/25/17 1405  . fentaNYL (SUBLIMAZE) injection 50 mcg  50 mcg Intravenous Once Awilda Bill, NP      . furosemide (LASIX) injection 20  mg  20 mg Intravenous Once Lafayette Dragon, MD      . insulin aspart (novoLOG) injection 0-15 Units  0-15 Units Subcutaneous Q4H Lafayette Dragon, MD   3 Units at 09/25/17 1217  . ipratropium-albuterol (DUONEB) 0.5-2.5 (3) MG/3ML nebulizer solution 3 mL  3 mL Nebulization Q6H PRN Awilda Bill, NP      . MEDLINE mouth rinse  15 mL Mouth Rinse BID Lafayette Dragon, MD      . metoprolol tartrate (LOPRESSOR) injection 2.5 mg  2.5 mg Intravenous Once Lafayette Dragon, MD      . multivitamin liquid 15 mL  15 mL Per Tube Daily Lafayette Dragon, MD      . multivitamin-lutein (OCUVITE-LUTEIN) capsule 1 capsule  1 capsule Per Tube Daily Lafayette Dragon, MD      . pantoprazole (PROTONIX) injection 40 mg  40 mg Intravenous Q12H Awilda Bill, NP   40 mg at 09/25/17 0835  . piperacillin-tazobactam (ZOSYN) IVPB 3.375 g  3.375 g Intravenous Q8H Coffee, Donna Christen, [facility]   Stopped at 09/25/17 1103  . sennosides (SENOKOT) 8.8  MG/5ML syrup 5 mL  5 mL Per Tube BID PRN Awilda Bill, NP         Discharge Medications: Please see discharge summary for a list of discharge medications.  Relevant Imaging Results:  Relevant Lab Results:   Additional Information    Shela Leff, LCSW

## 2017-09-25 NOTE — Progress Notes (Signed)
Daily Progress Note   Patient Name: Nina Hudson       Date: 09/25/2017 DOB: 02/05/1946  Age: 72 y.o. MRN#: [id number] Attending Physician: Dustin Flock, MD Primary Care Physician: Derinda Late, MD Admit Date: 09/23/2017  Reason for Consultation/Follow-up: Establishing goals of care and Psychosocial/spiritual support  Subjective: Intubated, moves extremities to command, opens eyes to command. Family at bedside.   Length of Stay: 2  Current Medications: Scheduled Meds:  . apixaban  5 mg Per Tube BID  . diltiazem  30 mg Oral Q6H  . feeding supplement (PRO-STAT SUGAR FREE 64)  30 mL Per Tube BID  . fentaNYL (SUBLIMAZE) injection  50 mcg Intravenous Once  . furosemide  20 mg Intravenous Once  . insulin aspart  0-15 Units Subcutaneous Q4H  . mouth rinse  15 mL Mouth Rinse BID  . multivitamin  15 mL Per Tube Daily  . multivitamin-lutein  1 capsule Per Tube Daily  . pantoprazole (PROTONIX) IV  40 mg Intravenous Q12H    Continuous Infusions: . albumin human    . amiodarone 30 mg/hr (09/25/17 1200)  . feeding supplement (VITAL 1.5 CAL) 1,000 mL (09/25/17 1405)  . piperacillin-tazobactam (ZOSYN)  IV Stopped (09/25/17 1103)    PRN Meds: acetaminophen **OR** acetaminophen, ipratropium-albuterol, sennosides  Physical Exam   Constitutional: No distress. She is sedated and intubated.  HENT:  Head: Normocephalic and atraumatic.  Cardiovascular: Tachycardia. An irregular rhythm present.  Pulmonary/Chest: Breath sounds normal. She is intubated.  Abdominal: Soft. Bowel sounds are normal.  Musculoskeletal: She exhibits edema.  Neurological:  Lightly sedated, opens eyes to voice, nods head appropriately  Skin: Skin is warm and dry.          Vital Signs: BP 125/86 (BP Location: Left Arm)    Pulse (!) 137   Temp 99.3 F (37.4 C) (Oral)   Resp 14   Ht 5' 5"  (1.651 m)   Wt 58.7 kg (129 lb 6.6 oz)   SpO2 99%   BMI 21.53 kg/m  SpO2: SpO2: 99 % O2 Device: O2 Device: Ventilator(PSV) O2 Flow Rate:    Intake/output summary:   Intake/Output Summary (Last 24 hours) at 09/25/2017 1418 Last data filed at 09/25/2017 1218 Gross per 24 hour  Intake 1767 ml  Output 690 ml  Net 1077 ml   LBM: Last BM Date: 09/23/17 Baseline Weight: Weight: 59 kg (130 lb) Most recent weight: Weight: 58.7 kg (129 lb 6.6 oz)       Palliative Assessment/Data: PPS 30%    Flowsheet Rows     Most Recent Value  Intake Tab  Referral Department  Critical care  Unit at Time of Referral  ICU  Palliative Care Primary Diagnosis  Sepsis/Infectious Disease  Date Notified  09/23/17  Palliative Care Type  New Palliative care  Reason for referral  Clarify Goals of Care  Date of Admission  09/23/17  Date first seen by Palliative Care  09/24/17  # of days Palliative referral response time  1 Day(s)  # of days IP prior to Palliative referral  0  Clinical Assessment  Palliative Performance Scale Score  30%  Psychosocial & Spiritual Assessment  Palliative Care Outcomes  Patient Active Problem List   Diagnosis Date Noted  . Goals of care, counseling/discussion   . Palliative care by specialist   . Pneumonia 09/23/2017  . Altered mental status 09/23/2017  . Acute respiratory failure with hypoxia ([place]) 09/23/2017  . Pressure injury of skin 09/23/2017    Palliative Care Assessment & Plan   HPI: 72 y.o. 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 Per NP note from facility, patient was found minimally responsive  with tachypnea and tachycardia, mottled legs. CXR at facility revealed pna vs edema. Husband was called and he wanted full code and transfer to hospital. Patient was supposed to be seen by palliative care at the facility but the visit had not been completed yet. Patient was recently hospitalized at [facility] 0[facility]7-6/[facility] complicated cholecystitis with cholecystectomy. She underwent an ERCP with stent placement and stone extraction with lap chole. She developed hypotension and bleeding postop requiring emergent transport back to the OR the same day, and wasfound to have hemoperitoneum due to clip falling off cystic artery resulting in cystic artery stump bleed requiring ligation. She was discharged from [facility] on 09/18/17 to the [facility] for rehab. Since discharge from [facility] she has been weak and fatigued with minimal PO intake. Upon arrival to [facility] pt required intubation d/t agonal respirations. Found to be guaiac positive and hgb 8.4 down from 10.2. Received 1 unit PRBCs. Lactic acid 7.5. CT abd from 6/20 revealed small amount of perihepatic and pelvic ascites and stable placement of patent metallic biliary stent. PMT consulted for [facility].   Assessment: Follow up with patient and family today - I assessed the patient and then met at the bedside along with patient's son and daughter to discuss diagnosis prognosis, GOC, EOL wishes, disposition and options.  They tell me their father is reluctant to meet with me because he is overwhelmed. He is also ill - recovering from surgery and sepsis. They want to discuss her care thoroughly and will relay messages to their father. Throughout different points in our meeting they called their father to answer some of my questions and to update him.  As far as functional and nutritional status, they describe a steady decline over the past year. They tell me that baseline, their mother is not healthy. She had a stroke 1-2 years ago that impacted her functional status and  since then she has been declining. They describe her as tired and depressed most of the time and feel she has "no will to live". After her hospitalization at [facility] this month she significantly declined. They tell me at rehab she would not eat or get out of bed and seemed exhausted everyday. They feel that she is "ready to die".    We discussed her current illness and what it means in the larger context of her on-going co-morbidities. We discussed potential outcomes. I shared with them my concerns about her long term prognosis given her severe debility prior to this hospitalization.   I attempted to elicit values and goals of care important to the patient.  They share with me that patient has a living will and if she would have been able to make decisions she would not have agreed to  come to the hospital at all. They tell me she would not have wanted to be intubated.   They tell me that their father is in denial about how sick the patient is. He does not want to have a goals of care discussion because it is overwhelming to him.   Advanced directives, concepts specific to code status, artifical feeding and hydration, and rehospitalization were considered and discussed. Patients husband is documented HCPOA followed by son and daughter. Patient has a living will - I reviewed this and placed copy on chart. We addressed code status - daughter and son feel she would want to be DNR but understand father is Media planner. We called the patient's spouse and he avoids making a decision about code status. Will remain full code for now. Needs further discussion.   Hospice and Palliative Care services outpatient were explained and offered. If patient discharges to rehab, should be followed by palliative care.   Questions and concerns were addressed. The family was encouraged to call with questions or concerns.   Recommendations/Plan:  Will follow up with family/patient to complete MOST form  Need to readdress  code status - hopefully can address with patient once extubated  If discharged, will need outpatient palliative  PMT will see next week if she is still here  Code Status:  Full code  Prognosis:   Unable to determine poor long-term prognosis d/t functional declines and failure to thrive  Discharge Planning:  To Be Determined - likely SNF rehab with palliative if no acute decompensation  Care plan was discussed with RN, Dr. Celesta Aver, patients son and daughter  Thank you for allowing the Palliative Medicine Team to assist in the care of this patient.   Time In: 1000 Time Out: 1200 Total Time 120 minutes  Prolonged Time Billed  yes       Greater than 50%  of this time was spent counseling and coordinating care related to the above assessment and plan.  Nina Burrow, DNP, [facility] Palliative Medicine Team Team Phone # [phone]  Pager [phone]

## 2017-09-25 NOTE — Progress Notes (Signed)
Pt extubated to 4 liters [place] approx 1350 this afternoon.  Tolerating well and [place] is now at 2 liters.  HR has improved slightly to 95-110.  Pt verbal and appropriate, responses are slow.  She appears tired and declined a bath this afternoon. Albumin and 20 mg lasix administered to address pitting edema and low UOP.  Family at bedside most of shift.

## 2017-09-25 NOTE — Progress Notes (Addendum)
Per Dr Celesta Aver OG tube [facility] and [place] placed to right nare at 65 cms in anticipation of extubation this afternoon.  ABD XRay ordered tp confirm placement.   Pt tolerating SBT very well.  Fentanyl is off at this time, pt reports no discomfort, attempts to hold head up off pillow

## 2017-09-26 DIAGNOSIS — J90 Pleural effusion, not elsewhere classified: Secondary | ICD-10-CM

## 2017-09-26 DIAGNOSIS — R601 Generalized edema: Secondary | ICD-10-CM

## 2017-09-26 DIAGNOSIS — E118 Type 2 diabetes mellitus with unspecified complications: Secondary | ICD-10-CM

## 2017-09-26 LAB — BASIC METABOLIC PANEL
Anion gap: 9 (ref 5–15)
BUN: 20 mg/dL (ref 6–20)
CHLORIDE: 94 mmol/L — AB (ref 101–111)
CO2: 33 mmol/L — ABNORMAL HIGH (ref 22–32)
Calcium: 7.3 mg/dL — ABNORMAL LOW (ref 8.9–10.3)
Creatinine, Ser: 0.52 mg/dL (ref 0.44–1.00)
GFR calc Af Amer: >60 mL/min (ref >60)
GLUCOSE: 248 mg/dL — AB (ref 65–99)
POTASSIUM: 3.7 mmol/L (ref 3.5–5.1)
SODIUM: 136 mmol/L (ref 135–145)

## 2017-09-26 LAB — CULTURE, RESPIRATORY W GRAM STAIN
Culture: NORMAL
Special Requests: NORMAL

## 2017-09-26 LAB — CBC WITH DIFFERENTIAL/PLATELET
BASOS ABS: 0 10*3/uL (ref 0–0.1)
Basophils Relative: 0 %
Eosinophils Absolute: 0.1 10*3/uL (ref 0–0.7)
Eosinophils Relative: 2 %
HCT: 29.4 % — ABNORMAL LOW (ref 35.0–47.0)
Hemoglobin: 9.7 g/dL — ABNORMAL LOW (ref 12.0–16.0)
LYMPHS ABS: 1 10*3/uL (ref 1.0–3.6)
Lymphocytes Relative: 21 %
MCH: 30.5 pg (ref 26.0–34.0)
MCHC: 32.9 g/dL (ref 32.0–36.0)
MCV: 92.7 fL (ref 80.0–100.0)
MONO ABS: 0.4 10*3/uL (ref 0.2–0.9)
Monocytes Relative: 9 %
NEUTROS ABS: 3.1 10*3/uL (ref 1.4–6.5)
Neutrophils Relative %: 68 %
PLATELETS: 108 10*3/uL — AB (ref 150–440)
RBC: 3.17 MIL/uL — ABNORMAL LOW (ref 3.80–5.20)
RDW: 21.6 % — AB (ref 11.5–14.5)
WBC: 4.6 10*3/uL (ref 3.6–11.0)

## 2017-09-26 LAB — GLUCOSE, CAPILLARY
GLUCOSE-CAPILLARY: 227 mg/dL — AB (ref 65–99)
GLUCOSE-CAPILLARY: 248 mg/dL — AB (ref 65–99)
GLUCOSE-CAPILLARY: 191 mg/dL — AB (ref 65–99)
Glucose-Capillary: 244 mg/dL — ABNORMAL HIGH (ref 65–99)
Glucose-Capillary: 225 mg/dL — ABNORMAL HIGH (ref 65–99)
Glucose-Capillary: 215 mg/dL — ABNORMAL HIGH (ref 65–99)

## 2017-09-26 LAB — PHOSPHORUS: PHOSPHORUS: 3.1 mg/dL (ref 2.5–4.6)

## 2017-09-26 LAB — MAGNESIUM: Magnesium: 2.1 mg/dL (ref 1.7–2.4)

## 2017-09-26 LAB — CULTURE, RESPIRATORY

## 2017-09-26 LAB — LACTIC ACID, PLASMA: LACTIC ACID, VENOUS: 1.2 mmol/L (ref 0.5–1.9)

## 2017-09-26 MED ORDER — GLIPIZIDE 5 MG PO TABS
5.0000 mg | ORAL_TABLET | Freq: Every day | ORAL | Status: DC
  Administered 2017-09-27 – 2017-10-06 (×9): 5 mg via ORAL
  Filled 2017-09-26 (×10): qty 1

## 2017-09-26 MED ORDER — POTASSIUM CHLORIDE 20 MEQ/15ML (10%) PO SOLN
20.0000 meq | Freq: Every day | ORAL | Status: DC
  Administered 2017-09-26 – 2017-09-27 (×2): 20 meq via ORAL
  Filled 2017-09-26 (×3): qty 15

## 2017-09-26 MED ORDER — FUROSEMIDE 10 MG/ML IJ SOLN
20.0000 mg | Freq: Once | INTRAMUSCULAR | Status: AC
  Administered 2017-09-26: 20 mg via INTRAVENOUS
  Filled 2017-09-26: qty 2

## 2017-09-26 MED ORDER — ALBUMIN HUMAN 25 % IV SOLN
12.5000 g | Freq: Once | INTRAVENOUS | Status: AC
  Administered 2017-09-26: 12.5 g via INTRAVENOUS
  Filled 2017-09-26: qty 50

## 2017-09-26 NOTE — Progress Notes (Signed)
PULMONARY / CRITICAL CARE MEDICINE   Name: Nina Hudson MRN: [id number] DOB: 12-Feb-1946    ADMISSION DATE:  09/23/2017 CONSULTATION DATE: 09/23/2017  REFERRING MD: Dr. Posey Pronto   CHIEF COMPLAINT: Unresponsiveness   HISTORY OF PRESENT ILLNESS:   This is a 72 yo female with a PMH of Stroke, HTN, Rheumatoid Arthritis,  Hypercholesteremia, Dysrhythmia, Diabetes Mellitus, Uterine Cancer, Diffuse Large B Cell Lymphoma, Atrial Fibrillation (previously on eliquis held due to recent surgery), Chronic Diastolic CHF (EF 55 to 73% on 09/08/17) , Depression, and Arthritis.  She presented to [facility] ER on 06/19 via EMS from the [facility] after being found minimally responsive with rapid respirations and increased heart rate. The nurse practitioner at the [facility] spoke with pts husband regarding change in pt condition, and her concern of possible pneumonia.  She discussed plan of care options with pts husband and he decided he wanted pt transported to [facility] for aggressive treatment.  She recently had a complicated hospitalization at [facility] from 41/12/3788-24/12/7351 for complicated cholecystitis with cholecystectomy. She underwent an ERCP with metal stent placement and stone extraction with lap chole.  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 She was discharged from [facility] on 09/18/17 to the [facility] for rehab.  According to pts daughter since discharge from [facility] she has had poor po intake, c/o weakness/fatigue, and unable to participate in rehab. Upon arrival to [facility] ER she was hypoxic O2 sats 80's on 4L O2 with agonal respirations requiring mechanical intubation.  Pt found to be guaiac positive with black stool with hgb 8.4, therefore she received 1 unit of pRBC's.  CXR concerning for pneumonia vs.  pulmonary edema and lactic acid 7.5, she received 1L fluid bolus and iv abx.  She was subsequently admitted to ICU by hospitalist team for further workup and treatment.      REVIEW OF SYSTEMS:   Review of Systems  Constitutional: Negative.   HENT: Negative.   Eyes: Negative.   Respiratory: Negative.   Cardiovascular: Positive for leg swelling.  Gastrointestinal: Negative.   Genitourinary: Negative.   Musculoskeletal: Negative.   Skin: Negative.   Neurological: Negative.   Endo/Heme/Allergies: Negative.   Psychiatric/Behavioral: Negative.     SUBJECTIVE:  Unable to assess pt intubated   VITAL SIGNS: BP 129/78 (BP Location: Left Arm)   Pulse 87   Temp 98.3 F (36.8 C) (Oral)   Resp (!) 22   Ht 5\' 5"  (1.651 m)   Wt 129 lb 6.6 oz (58.7 kg)   SpO2 95%   BMI 21.53 kg/m   HEMODYNAMICS:  no compromise  OXYGEN:  2 litres [place]  INTAKE / OUTPUT: I/O last 3 completed shifts: In: 2410.5 [I.V.:777.7; NG/GT:1222.1; IV Piggyback:410.6] Out: 2155 [Urine:2155]  PHYSICAL EXAMINATION: General: comfortable on  mechanically ventilator Neuro: sedated but followed simple commands as per nurse, PERRL  HEENT: supple, no JVD Cardiovascular: irregular irregular, no R/G Lungs: ghood A/E bilateral, no rales or rhonchi Abdomen: RUQ incision site with steri strips in place with dried blood and ecchymosis with small hematoma, +BS x4, soft, non distended, tender  Musculoskeletal: 2+ bilateral lower extremity edema, 3+ upper extremities Skin: RUQ incision site   LABS:  BMET Recent Labs  Lab 09/24/17 2336 09/25/17 0513 09/26/17 0641  NA 139 140 136  K 3.5 3.7 3.7  CL 95* 97* 94*  CO2 33* 33* 33*  BUN 17 17 20   CREATININE 0.76 0.65 0.52  GLUCOSE 252* 199* 248*    Electrolytes Recent Labs  Lab 09/24/17 0540 09/24/17 2336 09/25/17 0513 09/26/17 0641  CALCIUM 7.8* 7.6* 7.6* 7.3*  MG 1.8 1.7  --  2.1  PHOS 1.6* 3.4  --  3.1    CBC Recent Labs  Lab 09/23/17 1323   09/24/17 0540 09/24/17 1715 09/24/17 2241 09/26/17 0746  WBC 6.8  --  6.6  --   --  4.6  HGB 8.4*   < > 9.4*  9.7* 9.9* 11.5* 9.7*  HCT 26.1*   < > 29.0*  29.0* 29.8* 33.6* 29.4*  PLT 163  --  154  --   --  108*   < > = values in this interval not displayed.    Coag's Recent Labs  Lab 09/23/17 1323  APTT 32  INR 2.50    Sepsis Markers Recent Labs  Lab 09/23/17 1837  09/24/17 1425 09/25/17 0513 09/26/17 0641  LATICACIDVEN  --    < > 2.6* 2.7* 1.2  PROCALCITON 0.19  --   --   --   --    < > = values in this interval not displayed.    ABG Recent Labs  Lab 09/23/17 1321 09/25/17 0400  PHART 7.49* 7.47*  PCO2ART 48 55*  PO2ART 352* 122*    Liver Enzymes Recent Labs  Lab 09/21/17 1254 09/23/17 1323 09/25/17 0513  AST 30 39 32  ALT 24 24 18   ALKPHOS 145* 132* 111  BILITOT 2.2* 2.2* 1.7*  ALBUMIN 2.8* 2.9* 2.3*    Cardiac Enzymes Recent Labs  Lab 09/23/17 1323 09/23/17 2128  TROPONINI 0.05* 0.04*    Glucose Recent Labs  Lab 09/25/17 2011 09/25/17 2339 09/26/17 0400 09/26/17 0716 09/26/17 0819 09/26/17 1143  GLUCAP 162* 195* 244* 227* 248* 225*    Imaging No results found. STUDIES/SIGNIFICANT EVENTS: 06/19 CT Head revealed no acute abnormality. Atrophy and chronic microvascular ischemic change. Sinus disease.  CULTURES: Blood x2 06/19>> Respiratory 06/19>> Urine 06/19>>  ANTIBIOTICS: Zosyn 06/19>> Vancomycin 06/19 x1 dose   LINES/TUBES: ETT 06/19>>  ASSESSMENT / PLAN:  PULMONARY A: Acute hypoxic respiratory failure secondary to pneumonia vs. pulmonary edema  Bilateral pleural effusions stable Doing well post extubation P:   Prn bronchodilator therapy  Incentive spirometry  CARDIOVASCULAR A:  Demand ischemia  Intermittent Vfibb/Vtach no further episodes ( A-Fib with aberrant?) Chronic Atrial Fibrillation with RVR- better controlled now  P:  Continuous telemetry monitoring  Maintain map >65 Continue on Cardizem and  wean off Amiodarone Continue on Eliquis   RENAL A:   Anasarca P:   Will give Albumin & Lasix Monitor UOP   GASTROINTESTINAL A:   Hx Gastrointestinal Bleeding  Hx: Cholecystitis s/p ERCP with stone removal and metal stent placement along with lap chole complicated by hemoperitoneum secondary to cystic artery bleed requiring ligation-09/09/17 P:   Continue TF, transition to oral intable SUP px: IV protonix   HEMATOLOGIC A:   Hx Acute blood loss anemia  P:   H&H remains stable Monitor for s/sx of bleeding  Transfuse for hgb <7 and/or active bleeding  VTE px: SCD's,  outpatient eliquis restarted   INFECTIOUS A:   Pneumonia cultures negative thus far P:   Trend WBC and monitor fever curve Trend PCT Follow cultures Continue abx as listed above Will De-escalate ABX  ENDOCRINE A:   Diabetes Mellitus  P:  CBG's q4hrs  SSI for target Glucose management  NEUROLOGIC A:   Acute metabolic encephalopathy likely secondary to hypoxia - resolved P:   PT/OT consult   FAMILY  - Updates: Spoke with pts daughter regarding place of care and all questions answered   Cammie Sickle, MD  Pulmonary/Critical Care Pager [phone] (please enter 7 digits) PCCM Consult Pager [phone] (please enter 7 digits)

## 2017-09-26 NOTE — Progress Notes (Signed)
[facility] [place] NAME: Nina Hudson    MR#:  [id number]  DATE OF BIRTH:  05-Apr-1946  SUBJECTIVE:  patient extubated. He appears week. Continue tube feeding secondary to poor PO intake. History of trouble swallowing speech eval pending.  REVIEW OF SYSTEMS:   Review of Systems  Unable to perform ROS: Acuity of condition   Tolerating Diet:ng feeding Tolerating PT: pending  DRUG ALLERGIES:   Allergies  Allergen Reactions  . Sulfa Antibiotics Other (See Comments)    "I get real achey" Other reaction(s): Muscle Pain, Other (See Comments)  General body aches andpain.     VITALS:  Blood pressure 129/78, pulse 87, temperature 98.3 F (36.8 C), temperature source Oral, resp. rate (!) 22, height 5\' 5"  (1.651 m), weight 58.7 kg (129 lb 6.6 oz), SpO2 95 %.  PHYSICAL EXAMINATION:   Physical Exam  GENERAL:  72 y.o.-year-old patient lying in the bed with no acute distress. Very deconditioned EYES: Pupils equal, round, reactive to light and accommodation. No scleral icterus. Extraocular muscles intact.  HEENT: Head atraumatic, normocephalic. Oropharynx and nasopharynx clear. Ng+ NECK:  Supple, no jugular venous distention. No thyroid enlargement, no tenderness.  LUNGS: Normal breath sounds bilaterally, no wheezing, rales, rhonchi. No use of accessory muscles of respiration.  CARDIOVASCULAR: S1, S2 normal. No murmurs, rubs, or gallops.  ABDOMEN: Soft, nontender, nondistended. Bowel sounds present. No organomegaly or mass.  EXTREMITIES: No cyanosis, clubbing or edema b/l.    NEUROLOGIC: Cranial nerves II through XII are intact. No focal Motor or sensory deficits b/l.   PSYCHIATRIC:  patient is alert and oriented x 3.  SKIN: No obvious rash, lesion, or ulcer. Bruises+  LABORATORY PANEL:  CBC Recent Labs  Lab 09/26/17 0746  WBC 4.6  HGB 9.7*  HCT 29.4*  PLT 108*    Chemistries  Recent Labs  Lab 09/25/17 0513  09/26/17 0641  NA 140 136  K 3.7 3.7  CL 97* 94*  CO2 33* 33*  GLUCOSE 199* 248*  BUN 17 20  CREATININE 0.65 0.52  CALCIUM 7.6* 7.3*  MG  --  2.1  AST 32  --   ALT 18  --   ALKPHOS 111  --   BILITOT 1.7*  --    Cardiac Enzymes Recent Labs  Lab 09/23/17 2128  TROPONINI 0.04*   RADIOLOGY:  Dg Chest Port 1 View  Result Date: 09/25/2017 CLINICAL DATA:  Respiratory failure. EXAM: PORTABLE CHEST 1 VIEW COMPARISON:  09/24/2017, 09/23/2017.  CT 09/23/2017. FINDINGS: Endotracheal tube and NG tube in stable position. Heart size normal. Mild bibasilar atelectasis. Interim improvement of previously identified small bilateral pleural effusions. No pneumothorax. IMPRESSION: 1.  Lines and tubes in stable position. 2. Mild bibasilar atelectasis. Interim improvement of previously identified small bilateral pleural effusions. Electronically Signed   By: Marcello Moores  Register   On: 09/25/2017 07:14   Dg Abd Portable 1v  Result Date: 09/25/2017 CLINICAL DATA:  Encounter for nasogastric tube placement EXAM: PORTABLE ABDOMEN - 1 VIEW COMPARISON:  Chest x-ray from earlier today FINDINGS: A nasogastric tube has been replaced by a feeding tube with tip at the fundus. Haziness at the bases from atelectasis and pleural fluid based on prior abdominal CT. Metallic biliary stent in place. Previously seen oral contrast has reached the colon. IMPRESSION: When compared to chest x-ray earlier today the orogastric tube has been replaced by a feeding tube with tip at the gastric fundus. Electronically Signed   By: Angelica Chessman  Watts M.D.   On: 09/25/2017 13:22   ASSESSMENT AND PLAN:   Nina Hudson a59 y.o.femalewith a known history of diffuse large B cell lymphoma, uterine cancer, history of a fib on eliquis, diabetes, comes to the emergency room from [facility] where she was admitted on 614 2019. Patient is being admitted type for acute on chronic hypoxic respiratory failure secondary to suspected pneumonia/G.I.  bleed/acute on chronic anemia.  1.acute on chronic hypoxic respiratory failure secondary to possible right lower lobe pneumonia -PE has been ruled out -Possible pneumonia positive tracheal aspirate Gram stain continue IV antibiotic Continue IV antibiotic with Zosyn  2.  Acute encephalopathy EEG shows slowing with sleep state Likely due to pneumonia  3. acute on chronic anemia -patient on eliquis -holding it at present -Hemoglobin stable this morning -Status post 1 unit of transfusion -Hemoglobin stable  3.History of ERCP with stone removal and stent placement along with lap Coley complicated by cystic artery bleed requiring exploratory laparotomy -this was done at [facility] on 6/5 /2019 -surgicalconsultation if needed  4.Diabetes sliding scale insulin for now  5.  A. fib with RVR continue amiodarone drip  6.DVT prophylaxis SCD  and Ted's and eliquis   Case discussed with Care Management/Social Worker. Management plans discussed with the patient, family and they are in agreement.  CODE STATUS: full  DVT Prophylaxis: eliquis  TOTAL TIME TAKING CARE OF THIS PATIENT: *30* minutes.  >50% time spent on counselling and coordination of care  POSSIBLE D/C IN few DAYS, DEPENDING ON CLINICAL CONDITION.  Note: This dictation was prepared with Dragon dictation along with smaller phrase technology. Any transcriptional errors that result from this process are unintentional.  Fritzi Mandes M.D on 09/26/2017 at 1:12 PM  Between 7am to 6pm - Pager - [phone]  After 6pm go to [email or web address] - password [email or web address] [place] [facility]  Office  [phone]  CC: Primary care physician; Derinda Late, MDPatient ID: Nina Hudson, female   DOB: [birth year], 72 y.o.   MRN: [id number]

## 2017-09-26 NOTE — Progress Notes (Signed)
   09/26/17 2010  Clinical Encounter Type  Visited With Patient  Visit Type Follow-up   While rounding on unit, staff suggested chaplain check on patient.  Patient appeared to be sleeping, chaplain spoke and patient responded but did not open her eyes.  Patient asked for chaplain to come back another time.

## 2017-09-26 NOTE — Progress Notes (Signed)
Pharmacy Electrolyte Monitoring Consult:  Pharmacy consulted to assist in monitoring and replacing electrolytes in this 72 y.o. female admitted on 09/23/2017 with Unresponsive  Patient received potassium phosphate 25mmol IV x 1 and Magnesium Sulfate 2g IV x 1 on 6/21.   Labs:  Sodium (mmol/L)  Date Value  09/26/2017 136  04/11/2014 138   Potassium (mmol/L)  Date Value  09/26/2017 3.7  04/11/2014 3.9   Magnesium (mg/dL)  Date Value  09/26/2017 2.1  06/07/2012 1.8   Phosphorus (mg/dL)  Date Value  09/26/2017 3.1  01/16/2012 3.0   Calcium (mg/dL)  Date Value  09/26/2017 7.3 (L)   Calcium, Total (mg/dL)  Date Value  04/11/2014 8.1 (L)   Albumin (g/dL)  Date Value  09/25/2017 2.3 (L)  03/01/2013 3.4    Assessment/Plan: Electrolytes within range, no additional supplementation. Will recheck electrolytes with am labs.   Pharmacy will continue to monitor and adjust per consult.   Paulina Fusi, PharmD, BCPS 09/26/2017 11:23 AM

## 2017-09-26 NOTE — Progress Notes (Signed)
Patient did well this shift. Maintained on 2L post extubation yesterday. Has had no indications of pain or discomfort. Remains afib on the monitor. Continue on IV Amiodarone and NG Cardizem. Good urine output, 100 ml this shift. Tolerating tube feeding. No concerns at this time.

## 2017-09-27 DIAGNOSIS — R29898 Other symptoms and signs involving the musculoskeletal system: Secondary | ICD-10-CM

## 2017-09-27 DIAGNOSIS — E43 Unspecified severe protein-calorie malnutrition: Secondary | ICD-10-CM

## 2017-09-27 LAB — BASIC METABOLIC PANEL
Anion gap: 7 (ref 5–15)
BUN: 22 mg/dL — AB (ref 6–20)
CALCIUM: 7.8 mg/dL — AB (ref 8.9–10.3)
CO2: 38 mmol/L — ABNORMAL HIGH (ref 22–32)
Chloride: 94 mmol/L — ABNORMAL LOW (ref 101–111)
Creatinine, Ser: 0.63 mg/dL (ref 0.44–1.00)
GFR calc Af Amer: >60 mL/min (ref >60)
GLUCOSE: 222 mg/dL — AB (ref 65–99)
Potassium: 3.5 mmol/L (ref 3.5–5.1)
Sodium: 139 mmol/L (ref 135–145)

## 2017-09-27 LAB — GLUCOSE, CAPILLARY
GLUCOSE-CAPILLARY: 136 mg/dL — AB (ref 65–99)
GLUCOSE-CAPILLARY: 116 mg/dL — AB (ref 65–99)
GLUCOSE-CAPILLARY: 170 mg/dL — AB (ref 65–99)
Glucose-Capillary: 179 mg/dL — ABNORMAL HIGH (ref 65–99)
Glucose-Capillary: 183 mg/dL — ABNORMAL HIGH (ref 65–99)
Glucose-Capillary: 211 mg/dL — ABNORMAL HIGH (ref 65–99)
Glucose-Capillary: 229 mg/dL — ABNORMAL HIGH (ref 65–99)

## 2017-09-27 LAB — CULTURE, RESPIRATORY W GRAM STAIN

## 2017-09-27 LAB — MAGNESIUM: MAGNESIUM: 1.9 mg/dL (ref 1.7–2.4)

## 2017-09-27 LAB — CULTURE, RESPIRATORY

## 2017-09-27 MED ORDER — METOPROLOL TARTRATE 25 MG PO TABS
12.5000 mg | ORAL_TABLET | Freq: Two times a day (BID) | ORAL | Status: DC
  Administered 2017-09-27 (×2): 12.5 mg
  Filled 2017-09-27 (×3): qty 1

## 2017-09-27 MED ORDER — METOPROLOL TARTRATE 25 MG/10 ML ORAL SUSPENSION: 12.5000 mg | Freq: Two times a day (BID) | ORAL | Status: DC

## 2017-09-27 MED ORDER — INSULIN GLARGINE 100 UNIT/ML ~~LOC~~ SOLN
5.0000 [IU] | Freq: Every day | SUBCUTANEOUS | Status: DC
  Administered 2017-09-27: 5 [IU] via SUBCUTANEOUS
  Filled 2017-09-27 (×2): qty 0.05

## 2017-09-27 NOTE — Progress Notes (Signed)
Patient more alert and oriented this shift. Answering all questions and thankful when treated. Family was at bedside at the beginning of the shift. CBG had to be covered q 4 hours. To start glucotrol today. Tolerating tube feeding, and has been pain free.

## 2017-09-27 NOTE — Evaluation (Signed)
Physical Therapy Evaluation Patient Details Name: Nina Hudson MRN: [id number] DOB: [birth year] Today's Date: 09/27/2017   History of Present Illness  presented to ER secondary to SOB, weakness, lethargy, noted with guaic positive stools; admitted with acute/chronic respiratory failure secondary to R LL PNA (requiring intubation 6/19-6/21), now on 2L supplemental O2. Of note, patient with recent hospitalization at outside hospital (6/2-6/14) due to acute cholecystitis/choledocholethiasis, status post ERCP with stent and stone removal and laprascopic cholecystectomy.  Course complicated by cystic artery bleed requiring exploratory laparotomy.  Discharge to STR due to significant weakness/deconditioning after extended hospitalization.  Clinical Impression  Upon evaluation, patient alert and oriented; follows simple commands, but requires increased encouragement for alertness and active participation with session. Maintains eyes closed majority of session, opening only when directly asked to do so.  Globally weak and deconditioned throughout all extremities (2+ to 3-/5) with significant edema bilat UEs, elbows distally.  Currently requiring max/dep assist for bed mobility; min/mod assist for unsupported sitting balance.  Maintains forward flexed, kyphotic posture with limited attempts at postural or balance correction.  Refused further OOB attempts, insisting on return to bed. Would benefit from skilled PT to address above deficits and promote optimal return to PLOF; recommend transition to STR upon discharge from acute hospitalization.     Follow Up Recommendations SNF    Equipment Recommendations       Recommendations for Other Services       Precautions / Restrictions Precautions Precautions: Fall Precaution Comments: NPO/NGT Restrictions Weight Bearing Restrictions: No      Mobility  Bed Mobility Overal bed mobility: Needs Assistance Bed Mobility: Supine to Sit;Sit to Supine      Supine to sit: Max assist Sit to supine: Total assist   General bed mobility comments: hand-over-hand assist to initiate and guide movement of bilat UEs, max/dep assist for truncal elevation  Transfers                 General transfer comment: unsafe/unable  Ambulation/Gait             General Gait Details: unsafe/unable  Stairs            Wheelchair Mobility    Modified Rankin (Stroke Patients Only)       Balance Overall balance assessment: Needs assistance Sitting-balance support: No upper extremity supported;Feet supported Sitting balance-Leahy Scale: Fair Sitting balance - Comments: min/mod assist to maintain static sitting balance edge of bed; kyphotic posture Postural control: Posterior lean     Standing balance comment: unsafe/unable                             Pertinent Vitals/Pain Pain Assessment: Faces Faces Pain Scale: Hurts a little bit Pain Location: abdomen Pain Descriptors / Indicators: Aching;Grimacing;Guarding Pain Intervention(s): Limited activity within patient's tolerance;Monitored during session;Repositioned    Home Living Family/patient expects to be discharged to:: Private residence Living Arrangements: Spouse/significant other Available Help at Discharge: Family Type of Home: House Home Access: Stairs to enter Entrance Stairs-Rails: Right Entrance Stairs-Number of Steps: 2 Home Layout: One level        Prior Function Level of Independence: Independent with assistive device(s)         Comments: Mod indep with 4WRW for ADLs, household and limited community mobilization; denies fall history.  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     Hand Dominance  Extremity/Trunk Assessment   Upper Extremity Assessment Upper Extremity Assessment: Generalized weakness(grossly 3-/5 throughout;  significant edema elbows distally.  Propped on pillows/elevated for edema management)    Lower Extremity Assessment Lower Extremity Assessment: Generalized weakness(grossly 2+ to 3-/5 throughout; notably atrophied)       Communication   Communication: No difficulties  Cognition Arousal/Alertness: Awake/alert Behavior During Therapy: WFL for tasks assessed/performed;Flat affect Overall Cognitive Status: Within Functional Limits for tasks assessed                                        General Comments      Exercises Other Exercises Other Exercises: Bilat LE therex, 1x10, act assist ROM: ankle pumps, SAQs, heel slides and hip abduct/adduct.  Extensive encouagement throughout   Assessment/Plan    PT Assessment Patient needs continued PT services  PT Problem List Decreased strength;Decreased range of motion;Decreased activity tolerance;Decreased balance;Decreased mobility;Decreased knowledge of use of DME;Decreased safety awareness;Decreased knowledge of precautions;Cardiopulmonary status limiting activity;Pain;Decreased skin integrity       PT Treatment Interventions DME instruction;Gait training;Stair training;Functional mobility training;Therapeutic activities;Therapeutic exercise;Balance training;Patient/family education    PT Goals (Current goals can be found in the Care Plan section)  Acute Rehab PT Goals Patient Stated Goal: to try PT Goal Formulation: With patient Time For Goal Achievement: 10/11/17 Potential to Achieve Goals: Fair    Frequency Min 2X/week   Barriers to discharge Decreased caregiver support      Co-evaluation               AM-PAC PT "6 Clicks" Daily Activity  Outcome Measure Difficulty turning over in bed (including adjusting bedclothes, sheets and blankets)?: Unable Difficulty moving from lying on back to sitting on the side of the bed? : Unable Difficulty sitting down on and standing up from a chair with arms (e.g.,  wheelchair, bedside commode, etc,.)?: Unable Help needed moving to and from a bed to chair (including a wheelchair)?: Total Help needed walking in hospital room?: Total Help needed climbing 3-5 steps with a railing? : Total 6 Click Score: 6    End of Session Equipment Utilized During Treatment: Oxygen Activity Tolerance: Patient limited by fatigue Patient left: in bed;with call bell/phone within reach;with nursing/sitter in room;with bed alarm set Nurse Communication: Mobility status PT Visit Diagnosis: Unsteadiness on feet (R26.81);Muscle weakness (generalized) (M62.81);Difficulty in walking, not elsewhere classified (R26.2)    Time: 7622-6333 PT Time Calculation (min) (ACUTE ONLY): 26 min   Charges:   PT Evaluation $PT Eval High Complexity: 1 High PT Treatments $Therapeutic Exercise: 8-22 mins   PT G Codes:        Teddie Curd H. Owens Shark, PT, DPT, NCS 09/27/17, 4:33 PM [phone]

## 2017-09-27 NOTE — Progress Notes (Signed)
Pharmacy Electrolyte Monitoring Consult:  Pharmacy consulted to assist in monitoring and replacing electrolytes in this 72 y.o. female admitted on 09/23/2017 with Unresponsive  Patient received potassium phosphate 58mmol IV x 1 and Magnesium Sulfate 2g IV x 1 on 6/21.   Labs:  Sodium (mmol/L)  Date Value  09/27/2017 139  04/11/2014 138   Potassium (mmol/L)  Date Value  09/27/2017 3.5  04/11/2014 3.9   Magnesium (mg/dL)  Date Value  09/27/2017 1.9  06/07/2012 1.8   Phosphorus (mg/dL)  Date Value  09/26/2017 3.1  01/16/2012 3.0   Calcium (mg/dL)  Date Value  09/27/2017 7.8 (L)   Calcium, Total (mg/dL)  Date Value  04/11/2014 8.1 (L)   Albumin (g/dL)  Date Value  09/25/2017 2.3 (L)  03/01/2013 3.4    Assessment/Plan: Electrolytes within range, no additional supplementation. Will recheck electrolytes with am labs.   Pharmacy will continue to monitor and adjust per consult.   Paulina Fusi, PharmD, BCPS 09/27/2017 10:50 AM

## 2017-09-27 NOTE — Progress Notes (Addendum)
[place] at [place] NAME: Nina Hudson    MR#:  [id number]  DATE OF BIRTH:  1945/04/18  SUBJECTIVE:  patient extubated. He appears week. Continue tube feeding secondary to poor PO intake. History of trouble swallowing speech eval pending. Patient extremity deconditioned. And [place].  REVIEW OF SYSTEMS:   Review of Systems  Constitutional: Negative for chills, fever and weight loss.  HENT: Negative for ear discharge, ear pain and nosebleeds.   Eyes: Negative for blurred vision, pain and discharge.  Respiratory: Negative for sputum production, shortness of breath, wheezing and stridor.   Cardiovascular: Negative for chest pain, palpitations, orthopnea and PND.  Gastrointestinal: Negative for abdominal pain, diarrhea, nausea and vomiting.  Genitourinary: Negative for frequency and urgency.  Musculoskeletal: Negative for back pain and joint pain.  Neurological: Positive for weakness. Negative for sensory change, speech change and focal weakness.  Psychiatric/Behavioral: Negative for depression and hallucinations. The patient is not nervous/anxious.    Tolerating Diet:ng feeding Tolerating PT: pending  DRUG ALLERGIES:   Allergies  Allergen Reactions  . Sulfa Antibiotics Other (See Comments)    "I get real achey" Other reaction(s): Muscle Pain, Other (See Comments)  General body aches andpain.     VITALS:  Blood pressure 136/84, pulse (!) 117, temperature 97.9 F (36.6 C), temperature source Axillary, resp. rate 19, height 5\' 5"  (1.651 m), weight 58.7 kg (129 lb 6.6 oz), SpO2 97 %.  PHYSICAL EXAMINATION:   Physical Exam  GENERAL:  72 y.o.-year-old patient lying in the bed with no acute distress. Very deconditioned, anasarca EYES: Pupils equal, round, reactive to light and accommodation. No scleral icterus. Extraocular muscles intact.  HEENT: Head atraumatic, normocephalic. Oropharynx and nasopharynx clear. Ng+ NECK:   Supple, no jugular venous distention. No thyroid enlargement, no tenderness.  LUNGS: Normal breath sounds bilaterally, no wheezing, rales, rhonchi. No use of accessory muscles of respiration.  CARDIOVASCULAR: S1, S2 normal. No murmurs, rubs, or gallops.  ABDOMEN: Soft, nontender, nondistended. Bowel sounds present. No organomegaly or mass.  EXTREMITIES: No cyanosis, clubbing Upper extremity edema.    NEUROLOGIC: Cranial nerves II through XII are intact. No focal Motor or sensory deficits b/l.very weak   PSYCHIATRIC:  patient is alert and oriented x 2.  SKIN: No obvious rash, lesion, or ulcer. Bruises+  LABORATORY PANEL:  CBC Recent Labs  Lab 09/26/17 0746  WBC 4.6  HGB 9.7*  HCT 29.4*  PLT 108*    Chemistries  Recent Labs  Lab 09/25/17 0513  09/27/17 0646  NA 140   < > 139  K 3.7   < > 3.5  CL 97*   < > 94*  CO2 33*   < > 38*  GLUCOSE 199*   < > 222*  BUN 17   < > 22*  CREATININE 0.65   < > 0.63  CALCIUM 7.6*   < > 7.8*  MG  --    < > 1.9  AST 32  --   --   ALT 18  --   --   ALKPHOS 111  --   --   BILITOT 1.7*  --   --    < > = values in this interval not displayed.   Cardiac Enzymes Recent Labs  Lab 09/23/17 2128  TROPONINI 0.04*   RADIOLOGY:  Dg Abd Portable 1v  Result Date: 09/25/2017 CLINICAL DATA:  Encounter for nasogastric tube placement EXAM: PORTABLE ABDOMEN - 1 VIEW COMPARISON:  Chest x-ray from  earlier today FINDINGS: A nasogastric tube has been replaced by a feeding tube with tip at the fundus. Haziness at the bases from atelectasis and pleural fluid based on prior abdominal CT. Metallic biliary stent in place. Previously seen oral contrast has reached the colon. IMPRESSION: When compared to chest x-ray earlier today the orogastric tube has been replaced by a feeding tube with tip at the gastric fundus. Electronically Signed   By: Monte Fantasia M.D.   On: 09/25/2017 13:22   ASSESSMENT AND PLAN:   [name] a known  history of diffuse large B cell lymphoma, uterine cancer, history of a fib on eliquis, diabetes, comes to the emergency room from [facility] where she was admitted on 614 2019. Patient is being admitted type for acute on chronic hypoxic respiratory failure secondary to suspected pneumonia/G.I. bleed/acute on chronic anemia.  1.acute on chronic hypoxic respiratory failure secondary to possible right lower lobe pneumonia -PE has been ruled out -Possible pneumonia positive tracheal aspirate Gram stain --growing pseudomonas -Continue IV antibiotic with Zosyn--consider po abx once sensitivities are back  2.  Acute encephalopathy EEG shows slowing with sleep state Likely due to pneumonia  3. acute on chronic anemia -patient on eliquis -holding it at present -Hemoglobin stable this morning -Status post 1 unit of transfusion -Hemoglobin stable  3.History of ERCP with stone removal and stent placement along with lap Coley complicated by cystic artery bleed requiring exploratory laparotomy -this was done at [facility] on 6/5 /2019 -surgicalconsultation if needed  4.Diabetes sliding scale insulin for now  5.  A. fib with RVR - continue amiodarone drip, still fluctuating in the 110's  6.DVT prophylaxis SCD  and Ted's and eliquis  7. Poor PO intake with malnutrition -currently getting NG tube feeding. -Therapy to evaluate swallow and start oral diet per recommendation thereafter    Case discussed with Care Management/Social Worker. Management plans discussed with the patient, family and they are in agreement.  CODE STATUS: full  DVT Prophylaxis: eliquis  TOTAL TIME TAKING CARE OF THIS PATIENT: *30* minutes.  >50% time spent on counselling and coordination of care  POSSIBLE D/C IN few DAYS, DEPENDING ON CLINICAL CONDITION.  Note: This dictation was prepared with Dragon dictation along with smaller phrase technology. Any transcriptional errors that result from this process are  unintentional.  Fritzi Mandes M.D on 09/27/2017 at 11:47 AM  Between 7am to 6pm - Pager - [phone]  After 6pm go to [email or web address] - password [email or web address] [place] [facility]  Office  [phone]  CC: Primary care physician; Nina Hudson, MDPatient ID: Nina Hudson, female   DOB: 09-Jul-1945, 72 y.o.   MRN: [id number]

## 2017-09-27 NOTE — Progress Notes (Signed)
PULMONARY / CRITICAL CARE MEDICINE   Name: Nina Hudson MRN: [id number] DOB: 08-13-1945    ADMISSION DATE:  09/23/2017 CONSULTATION DATE: 09/23/2017  REFERRING MD: Dr. Posey Pronto   CHIEF COMPLAINT: Unresponsiveness   HISTORY OF PRESENT ILLNESS:   This is a 72 yo female with a PMH of Stroke, HTN, Rheumatoid Arthritis,  Hypercholesteremia, Dysrhythmia, Diabetes Mellitus, Uterine Cancer, Diffuse Large B Cell Lymphoma, Atrial Fibrillation (previously on eliquis held due to recent surgery), Chronic Diastolic CHF (EF 55 to 18% on 09/08/17) , Depression, and Arthritis.  She presented to [facility] ER on 06/19 via EMS from the [place] after being found minimally responsive with rapid respirations and increased heart rate. The nurse practitioner at the [facility] spoke with pts husband regarding change in pt condition, and her concern of possible pneumonia.  She discussed plan of care options with pts husband and he decided he wanted pt transported to [facility] for aggressive treatment.  She recently had a complicated hospitalization at [facility] from 84/04/6604-30/16/0109 for complicated cholecystitis with cholecystectomy. She underwent an ERCP with metal stent placement and stone extraction with lap chole.  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 She was discharged from [facility] on 09/18/17 to the [place] for rehab.  According to pts daughter since discharge from [facility] she has had poor po intake, c/o weakness/fatigue, and unable to participate in rehab. Upon arrival to [facility] ER she was hypoxic O2 sats 80's on 4L O2 with agonal respirations requiring mechanical intubation.  Pt found to be guaiac positive with black stool with hgb 8.4, therefore she received 1 unit of pRBC's.  CXR concerning for pneumonia vs.  pulmonary edema and lactic acid 7.5, she received 1L fluid bolus and iv abx.  She was subsequently admitted to ICU by hospitalist team for further workup and treatment.      REVIEW OF SYSTEMS:   Review of Systems  Constitutional: Negative.   HENT: Negative.   Eyes: Negative.   Respiratory: Positive for sputum production.        Non productive  Cardiovascular: Positive for leg swelling.       3+  Gastrointestinal: Negative.   Genitourinary: Negative.   Musculoskeletal: Negative.   Skin: Negative for rash.  Neurological: Negative.   Endo/Heme/Allergies: Negative.   Psychiatric/Behavioral: Negative.    SUBJECTIVE:  Unable to assess pt intubated   VITAL SIGNS: BP 128/83   Pulse 98   Temp 97.7 F (36.5 C) (Oral)   Resp (!) 22   Ht 5\' 5"  (1.651 m)   Wt 129 lb 6.6 oz (58.7 kg)   SpO2 100%   BMI 21.53 kg/m   HEMODYNAMICS:  no compromise  OXYGEN: FiO2 (%):  [28 %] 28 %2 litres [place]  INTAKE / OUTPUT: I/O last 3 completed shifts: In: 2264.6 [I.V.:565.8; NG/GT:1555; IV Piggyback:143.8] Out: 3020 [Urine:3020]  PHYSICAL EXAMINATION: General: comfortable without distress Neuro: awake, alert and appropriate, PERRL,  HEENT: supple, no JVD Cardiovascular: irregular irregular, no R/G Lungs: ghood A/E bilateral, few crackles Abdomen: RUQ incision site with steri strips in place with dried blood and ecchymosis with small hematoma, +BS x4, soft, non distended, tender  Musculoskeletal: 2+ bilateral lower extremity edema, 3+ upper extremities Skin: RUQ incision site   LABS:  BMET Recent Labs  Lab 09/25/17 0513 09/26/17 3235 09/27/17 5732  NA 140 136 139  K 3.7 3.7 3.5  CL 97* 94* 94*  CO2 33* 33* 38*  BUN 17 20 22*  CREATININE 0.65 0.52 0.63  GLUCOSE 199* 248* 222*    Electrolytes Recent Labs  Lab 09/24/17 0540 09/24/17 2336 09/25/17 0513 09/26/17 0641 09/27/17 0646  CALCIUM 7.8* 7.6* 7.6* 7.3* 7.8*  MG 1.8 1.7  --  2.1 1.9  PHOS 1.6* 3.4  --  3.1  --      CBC Recent Labs  Lab 09/23/17 1323  09/24/17 0540 09/24/17 1715 09/24/17 2241 09/26/17 0746  WBC 6.8  --  6.6  --   --  4.6  HGB 8.4*   < > 9.4*  9.7* 9.9* 11.5* 9.7*  HCT 26.1*   < > 29.0*  29.0* 29.8* 33.6* 29.4*  PLT 163  --  154  --   --  108*   < > = values in this interval not displayed.    Coag's Recent Labs  Lab 09/23/17 1323  APTT 32  INR 2.50    Sepsis Markers Recent Labs  Lab 09/23/17 1837  09/24/17 1425 09/25/17 0513 09/26/17 0641  LATICACIDVEN  --    < > 2.6* 2.7* 1.2  PROCALCITON 0.19  --   --   --   --    < > = values in this interval not displayed.    ABG Recent Labs  Lab 09/23/17 1321 09/25/17 0400  PHART 7.49* 7.47*  PCO2ART 48 55*  PO2ART 352* 122*    Liver Enzymes Recent Labs  Lab 09/21/17 1254 09/23/17 1323 09/25/17 0513  AST 30 39 32  ALT 24 24 18   ALKPHOS 145* 132* 111  BILITOT 2.2* 2.2* 1.7*  ALBUMIN 2.8* 2.9* 2.3*    Cardiac Enzymes Recent Labs  Lab 09/23/17 1323 09/23/17 2128  TROPONINI 0.05* 0.04*    Glucose Recent Labs  Lab 09/26/17 1143 09/26/17 1633 09/26/17 1955 09/26/17 2359 09/27/17 0419 09/27/17 0819  GLUCAP 225* 215* 191* 183* 211* 229*    Imaging No results found. STUDIES/SIGNIFICANT EVENTS: 06/19 CT Head revealed no acute abnormality. Atrophy and chronic microvascular ischemic change. Sinus disease.  CULTURES: Blood x2 06/19>> Respiratory 06/19>> Urine 06/19>>  ANTIBIOTICS: Zosyn 06/19>> Vancomycin 06/19 x1 dose   LINES/TUBES: ETT 06/19>>  ASSESSMENT / PLAN:  PULMONARY A: Acute hypoxic respiratory failure secondary to pneumonia vs. pulmonary edema  Bilateral pleural effusions stable Doing well post extubation P:   Prn bronchodilator therapy  Incentive spirometry/ flutter valve  CARDIOVASCULAR A:  Demand ischemia  Intermittent Vfibb/Vtach no further episodes ( A-Fib with aberrant?) Chronic Atrial Fibrillation with RVR- better controlled now  P:  Continuous  telemetry monitoring  Maintain map >65 Continue on Cardizem and wean off Amiodarone Continue on Eliquis Restart Lopressor today   RENAL A:   Anasarca P:   Monitor UOP   GASTROINTESTINAL A:   Hx Gastrointestinal Bleeding  Hx: Cholecystitis s/p ERCP with stone removal and metal stent placement along with lap chole complicated by hemoperitoneum secondary to cystic artery bleed requiring ligation-09/09/17 Severe Protein Malnutrition P:   Continue TF, transition to oral intable SUP px: IV protonix   HEMATOLOGIC A:   Hx Acute blood loss anemia  P:   H&H remains stable Monitor for s/sx of bleeding  Transfuse for hgb <7 and/or active bleeding  VTE px: SCD's,  outpatient eliquis restarted   INFECTIOUS A:   Pneumonia cultures negative thus far P:   Trend WBC and monitor fever curve Trend PCT  Follow cultures Continue abx as listed above Will De-escalate ABX  ENDOCRINE A:   Diabetes Mellitus  P:   CBG's q4hrs  SSI for target Glucose management Add low dose Lantus for better control  NEUROLOGIC A:   Acute metabolic encephalopathy likely secondary to hypoxia - resolved Severe Muscle Deconditioning P:   PT/OT consult Disp: change to step down status  FAMILY  - Updates: Son has been updated   Cammie Sickle, MD  Pulmonary/Critical Care Pager [phone] (please enter 7 digits) PCCM Consult Pager [phone] (please enter 7 digits)

## 2017-09-28 DIAGNOSIS — E876 Hypokalemia: Secondary | ICD-10-CM

## 2017-09-28 LAB — BASIC METABOLIC PANEL
ANION GAP: 9 (ref 5–15)
BUN: 23 mg/dL — ABNORMAL HIGH (ref 6–20)
CALCIUM: 7.9 mg/dL — AB (ref 8.9–10.3)
CO2: 36 mmol/L — AB (ref 22–32)
Chloride: 93 mmol/L — ABNORMAL LOW (ref 101–111)
Creatinine, Ser: 0.46 mg/dL (ref 0.44–1.00)
GFR calc Af Amer: >60 mL/min (ref >60)
GFR calc non Af Amer: >60 mL/min (ref >60)
Glucose, Bld: 211 mg/dL — ABNORMAL HIGH (ref 65–99)
POTASSIUM: 3.5 mmol/L (ref 3.5–5.1)
SODIUM: 138 mmol/L (ref 135–145)

## 2017-09-28 LAB — CULTURE, BLOOD (ROUTINE X 2)
CULTURE: NO GROWTH
CULTURE: NO GROWTH
Special Requests: ADEQUATE

## 2017-09-28 LAB — GLUCOSE, CAPILLARY
GLUCOSE-CAPILLARY: 235 mg/dL — AB (ref 65–99)
GLUCOSE-CAPILLARY: 206 mg/dL — AB (ref 65–99)
GLUCOSE-CAPILLARY: 203 mg/dL — AB (ref 65–99)
GLUCOSE-CAPILLARY: 161 mg/dL — AB (ref 65–99)
Glucose-Capillary: 189 mg/dL — ABNORMAL HIGH (ref 65–99)

## 2017-09-28 MED ORDER — INSULIN GLARGINE 100 UNIT/ML ~~LOC~~ SOLN
10.0000 [IU] | Freq: Every day | SUBCUTANEOUS | Status: DC
  Administered 2017-10-01: 10 [IU] via SUBCUTANEOUS
  Filled 2017-09-28 (×5): qty 0.1

## 2017-09-28 MED ORDER — POLYETHYLENE GLYCOL 3350 17 G PO PACK
17.0000 g | PACK | Freq: Every day | ORAL | Status: DC | PRN
  Filled 2017-09-28: qty 1

## 2017-09-28 MED ORDER — OSMOLITE 1.5 CAL PO LIQD
1000.0000 mL | ORAL | Status: DC
  Administered 2017-09-28 – 2017-09-29 (×2): 1000 mL

## 2017-09-28 MED ORDER — POTASSIUM CHLORIDE 20 MEQ PO PACK
20.0000 meq | PACK | Freq: Every day | ORAL | Status: DC
  Administered 2017-09-29 – 2017-10-02 (×4): 20 meq via ORAL
  Filled 2017-09-28 (×4): qty 1

## 2017-09-28 MED ORDER — METOPROLOL TARTRATE 50 MG PO TABS
50.0000 mg | ORAL_TABLET | Freq: Two times a day (BID) | ORAL | Status: DC
  Administered 2017-09-28 – 2017-09-30 (×5): 50 mg
  Filled 2017-09-28 (×6): qty 1

## 2017-09-28 MED ORDER — POTASSIUM CHLORIDE 20 MEQ PO PACK
40.0000 meq | PACK | Freq: Once | ORAL | Status: AC
  Administered 2017-09-28: 40 meq via ORAL
  Filled 2017-09-28: qty 2

## 2017-09-28 MED ORDER — PRO-STAT SUGAR FREE PO LIQD
30.0000 mL | Freq: Every day | ORAL | Status: DC
Start: 2017-09-29 — End: 2017-10-01
  Administered 2017-09-29 – 2017-10-01 (×2): 30 mL

## 2017-09-28 MED ORDER — FREE WATER
100.0000 mL | Freq: Four times a day (QID) | Status: DC
  Administered 2017-09-28 – 2017-09-30 (×7): 100 mL

## 2017-09-28 NOTE — Progress Notes (Signed)
Inpatient Diabetes Program Recommendations  AACE/ADA: New Consensus Statement on Inpatient Glycemic Control (2015)  Target Ranges:  Prepandial:   less than 140 mg/dL      Peak postprandial:   less than 180 mg/dL (1-2 hours)      Critically ill patients:  140 - 180 mg/dL   Results for Nina Hudson, Nina Hudson (MRN [id number]) as of 09/28/2017 09:05  Ref. Range 09/26/2017 23:59 09/27/2017 04:19 09/27/2017 08:19 09/27/2017 11:49 09/27/2017 16:33 09/27/2017 19:40  Glucose-Capillary Latest Ref Range: 65 - 99 mg/dL 183 (H) 211 (H) 229 (H) 179 (H) 136 (H) 116 (H)   Results for Nina Hudson, Nina Hudson (MRN [id number]) as of 09/28/2017 09:05  Ref. Range 09/27/2017 23:27 09/28/2017 03:40 09/28/2017 07:29  Glucose-Capillary Latest Ref Range: 65 - 99 mg/dL 170 (H) 189 (H) 235 (H)    Home DM Meds: Glipizide 5 mg daily       Metformin 500 mg BID  Current Insulin Orders: Lantus 5 units QHS         Novolog Moderate Correction Scale/ SSI (0-15 units) Q4 hours      Glipizide 5 mg daily      Note Lantus started last PM.  Patient has been Extubated but still getting Tube Feedings at 35cc/hour.   MD- Please consider the following in-hospital insulin adjustments:  1. May want to Stop Glipizide for now until taking solid PO diet  2. Increase Lantus slightly to 8 units QHS     --Will follow patient during hospitalization--  Wyn Quaker RN, MSN, CDE Diabetes Coordinator Inpatient Glycemic Control Team Team Pager: [phone] (8a-5p)

## 2017-09-28 NOTE — Progress Notes (Signed)
Patient woke up before noon and asked if she was getting up to the chair and getting something to eat. Had to reorient patient. She was also calling out for her children. This happened last pm and this AM. Has been pain free. Remains Afib but metoprolol was added to the scheduled Cardizem.

## 2017-09-28 NOTE — Progress Notes (Signed)
Received patient from CCU ,alert and oriented,generalized edema,hypertensive,afib,dobhoff tubed with osmolite infusing,denies pain.

## 2017-09-28 NOTE — Progress Notes (Signed)
PULMONARY / CRITICAL CARE MEDICINE   Name: Nina Hudson MRN: [id number] DOB: 1945/12/16    ADMISSION DATE:  09/23/2017 CONSULTATION DATE: 09/23/2017  REFERRING MD: Dr. Posey Pronto   CHIEF COMPLAINT: Unresponsiveness   HISTORY OF PRESENT ILLNESS:   This is a 72 yo female with a PMH of Stroke, HTN, Rheumatoid Arthritis,  Hypercholesteremia, Dysrhythmia, Diabetes Mellitus, Uterine Cancer, Diffuse Large B Cell Lymphoma, Atrial Fibrillation (previously on eliquis held due to recent surgery), Chronic Diastolic CHF (EF 55 to 32% on 09/08/17) , Depression, and Arthritis.  She presented to [facility] ER on 06/19 via EMS from the [facility] after being found minimally responsive with rapid respirations and increased heart rate. The nurse practitioner at the [place] spoke with pts husband regarding change in pt condition, and her concern of possible pneumonia.  She discussed plan of care options with pts husband and he decided he wanted pt transported to [facility] for aggressive treatment.  She recently had a complicated hospitalization at [facility] from 99/05/4266-34/19/6222 for complicated cholecystitis with cholecystectomy. She underwent an ERCP with metal stent placement and stone extraction with lap chole.  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 She was discharged from [facility] on 09/18/17 to the [place] for rehab.  According to pts daughter since discharge from [facility] she has had poor po intake, c/o weakness/fatigue, and unable to participate in rehab. Upon arrival to [facility] ER she was hypoxic O2 sats 80's on 4L O2 with agonal respirations requiring mechanical intubation.  Pt found to be guaiac positive with black stool with hgb 8.4, therefore she received 1 unit of pRBC's.  CXR concerning for pneumonia vs.  pulmonary edema and lactic acid 7.5, she received 1L fluid bolus and iv abx.  She was subsequently admitted to ICU by hospitalist team for further workup and treatment.      REVIEW OF SYSTEMS:   Review of Systems  Constitutional: Negative.   HENT: Negative.   Eyes: Negative.   Respiratory: Positive for sputum production.        Non productive  Cardiovascular: Positive for leg swelling.       3+  Gastrointestinal: Negative.   Genitourinary: Negative.   Musculoskeletal: Negative.   Skin: Negative for rash.  Neurological: Negative.   Endo/Heme/Allergies: Negative.   Psychiatric/Behavioral: Negative.    SUBJECTIVE:  Unable to assess pt intubated   VITAL SIGNS: BP 117/86   Pulse 75   Temp 97.9 F (36.6 C) (Oral)   Resp (!) 22   Ht 5\' 5"  (1.651 m)   Wt 129 lb 6.6 oz (58.7 kg)   SpO2 99%   BMI 21.53 kg/m   HEMODYNAMICS:  no compromise  OXYGEN:  2 litres [place]  INTAKE / OUTPUT: I/O last 3 completed shifts: In: 1708.3 [I.V.:296.4; NG/GT:1190; IV Piggyback:221.9] Out: 9798 [Urine:3825]  PHYSICAL EXAMINATION: General: comfortable without distress Neuro: awake, alert and appropriate, PERRL,  HEENT: supple, no JVD Cardiovascular: irregular irregular, no R/G Lungs: ghood A/E bilateral, few crackles Abdomen: RUQ incision site with steri strips in place with dried blood and ecchymosis with small hematoma, +BS x4, soft, non distended, tender  Musculoskeletal: 2+ bilateral lower extremity edema, 2+ upper extremities Skin: RUQ incision site   LABS:  BMET Recent Labs  Lab 09/26/17 0641 09/27/17 0646 09/28/17 0416  NA 136 139 138  K 3.7 3.5 3.5  CL 94* 94* 93*  CO2 33* 38* 36*  BUN 20 22* 23*  CREATININE 0.52 0.63 0.46  GLUCOSE 248* 222* 211*    Electrolytes Recent Labs  Lab 09/24/17 0540 09/24/17 2336  09/26/17 0641 09/27/17 0646 09/28/17 0416  CALCIUM 7.8* 7.6*   < > 7.3* 7.8* 7.9*  MG 1.8 1.7  --  2.1 1.9  --   PHOS 1.6* 3.4  --  3.1  --   --    < > =  values in this interval not displayed.    CBC Recent Labs  Lab 09/23/17 1323  09/24/17 0540 09/24/17 1715 09/24/17 2241 09/26/17 0746  WBC 6.8  --  6.6  --   --  4.6  HGB 8.4*   < > 9.4*  9.7* 9.9* 11.5* 9.7*  HCT 26.1*   < > 29.0*  29.0* 29.8* 33.6* 29.4*  PLT 163  --  154  --   --  108*   < > = values in this interval not displayed.    Coag's Recent Labs  Lab 09/23/17 1323  APTT 32  INR 2.50    Sepsis Markers Recent Labs  Lab 09/23/17 1837  09/24/17 1425 09/25/17 0513 09/26/17 0641  LATICACIDVEN  --    < > 2.6* 2.7* 1.2  PROCALCITON 0.19  --   --   --   --    < > = values in this interval not displayed.    ABG Recent Labs  Lab 09/23/17 1321 09/25/17 0400  PHART 7.49* 7.47*  PCO2ART 48 55*  PO2ART 352* 122*    Liver Enzymes Recent Labs  Lab 09/21/17 1254 09/23/17 1323 09/25/17 0513  AST 30 39 32  ALT 24 24 18   ALKPHOS 145* 132* 111  BILITOT 2.2* 2.2* 1.7*  ALBUMIN 2.8* 2.9* 2.3*    Cardiac Enzymes Recent Labs  Lab 09/23/17 1323 09/23/17 2128  TROPONINI 0.05* 0.04*    Glucose Recent Labs  Lab 09/27/17 1149 09/27/17 1633 09/27/17 1940 09/27/17 2327 09/28/17 0340 09/28/17 0729  GLUCAP 179* 136* 116* 170* 189* 235*    Imaging No results found. STUDIES/SIGNIFICANT EVENTS: 06/19 CT Head revealed no acute abnormality. Atrophy and chronic microvascular ischemic change. Sinus disease.  CULTURES: Blood x2 06/19>> Respiratory 06/19>> Urine 06/19>>  ANTIBIOTICS: Zosyn 06/19>> Vancomycin 06/19 x1 dose   LINES/TUBES: ETT 06/19>>  ASSESSMENT / PLAN:  PULMONARY A: Acute hypoxic respiratory failure secondary to pneumonia vs. pulmonary edema  Bilateral pleural effusions stable Doing well post extubation P:   Prn bronchodilator therapy  Incentive spirometry/ flutter valve  CARDIOVASCULAR A:  Demand ischemia  Intermittent Vfibb/Vtach no further episodes ( A-Fib with aberrant?) Chronic Atrial Fibrillation with RVR- better  controlled now  P:  Continuous telemetry monitoring  Maintain map >65 Continue on Cardizem and wean off Amiodarone Continue on Eliquis Will increase Lopressor to home/ maintenance dose   RENAL A:   Anasarca improving P:   Monitor UOP   GASTROINTESTINAL A:   Hx Gastrointestinal Bleeding  Hx: Cholecystitis s/p ERCP with stone removal and metal stent placement along with lap chole complicated by hemoperitoneum secondary to cystic artery bleed requiring ligation-09/09/17 Severe Protein Malnutrition P:   Continue TF, transition to oral intable, may need G tube if unable to have adequate caloric intake SUP px: IV protonix   HEMATOLOGIC A:   Hx Acute blood loss anemia  P:   H&H remains stable Monitor for s/sx of bleeding  Transfuse for hgb <7  and/or active bleeding  VTE px: SCD's,  outpatient eliquis restarted   INFECTIOUS A:   Pneumonia cultures negative thus far P:   Trend WBC and monitor fever curve Trend PCT Follow cultures Continue abx as listed above Will D/C Zosyn after today's dose as all cultures are negative  ENDOCRINE A:   Diabetes Mellitus  P:   CBG's q4hrs  SSI for target Glucose management Will increase Lantus for better control  NEUROLOGIC A:   Acute metabolic encephalopathy likely secondary to hypoxia - resolved Severe Muscle Deconditioning P:   PT/OT consult Disp: Transfer to telemetry  FAMILY  - Updates: Son has been updated   Cammie Sickle, MD  Pulmonary/Critical Care Pager [phone] (please enter 7 digits) PCCM Consult Pager [phone] (please enter 7 digits)

## 2017-09-28 NOTE — Progress Notes (Signed)
Nutrition Follow-up  DOCUMENTATION CODES:   Not applicable  INTERVENTION:  Recommend continuing tube feeds via Dobbhoff tube until patient is able to meet >60% needs from intake of meals and oral nutrition supplements on trays. RD will continue to follow and adjust TFs pending intake.  Initiate new goal TF regimen of Osmolite 1.5 Cal at 45 mL/hr + Pro-Stat 30 mL once daily via NGT. Provides 1720 kcal, 83 grams of protein, 821 mL H2O daily.  Provide free water flush of 100 mL Q6hrs. Provides total of 1221 mL H2O daily including water in tube feeding. Patient will also get some fluids PO.  Will discontinue liquid MVI and Ocuvite.  Provide Magic cup TID with meals, each supplement provides 290 kcal and 9 grams of protein.  Provide Vital Cuisine (Hormel Shake) TID with meals, each supplement provides 520 kcal and 22 grams of protein.  NUTRITION DIAGNOSIS:   Inadequate oral intake related to acute illness as evidenced by NPO status.  Ongoing inadequate intake though patient was started on diet today. Patient reports she does not want to eat.  GOAL:   Patient will meet greater than or equal to 90% of their needs  Met with TF regimen.  MONITOR:   Diet advancement, Vent status, Labs, Weight trends, Skin, I & O's  REASON FOR ASSESSMENT:   Ventilator    ASSESSMENT:   72 yo female with a PMH of Stroke, HTN, Rheumatoid Arthritis,  Hypercholesteremia, Dysrhythmia, Diabetes Mellitus, Uterine Cancer, Diffuse Large B Cell Lymphoma, Atrial Fibrillation (previously on eliquis held due to recent surgery), Chronic Diastolic CHF (EF 55 to 33% on 09/08/17) , Depression, and Arthritis.  She presented to [facility] ER on 06/19 via EMS from the [place] after being found minimally responsive with rapid respirations and increased heart rate. She recently had a complicated hospitalization at [facility] from 29/08/1882-16/60/6301 for complicated cholecystitis with cholecystectomy. She underwent an ERCP  with metal stent placement and stone extraction with lap chole.  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 She was discharged from [facility] on 09/18/17 to the [place] for rehab.    -On 6/21 OGT was removed and Dobbhoff tube was placed in right nare. -Patient was later extubated on 6/21. -Patient was evaluated by SLP today and placed on dysphagia 2 diet with nectar-thick liquids.  Met with patient at bedside. No family members present at time of RD assessment. Discussed that she had been started on a diet so she would be able to eat lunch today. Patient then became upset stating that she did not want to eat anything today. After some discussion she is amenable to trying lunch later. She is also amenable to drinking some oral nutrition supplements. Per discussion on rounds patient had poor PO intake at baseline.  Enteral Access: Dobbhoff tube placed in right nare on 6/21; terminates at gastric fundus per abdominal x-ray 6/21; 65 cm at right nare  TF: pt tolerating Vital 1.5 Cal at 35 mL/hr + Pro-Stat 30 mL BID  Medications reviewed and include: glipizide 5 mg daily before breakfast, Novolog 0-15 units Q4hrs, Lantus 10 units daily at bedtime, liquid MVI daily per tube, Ocuvite daily per tube, pantoprazole, potassium chloride 20 mEq daily, Zosyn.  Labs reviewed: CBG 170-235, Chloride 93, CO2 36, BUN 23.  I/O: 2375 mL UOP yesterday (1.7 mL/kg/hr)  No weight to trend since admission.  Discussed with RN and on rounds. Plan is for patient to transfer out of ICU today. Starting Miralax today as patient has not had a documented bowel movement this admission.  Diet Order:   Diet Order           DIET DYS 2 Room service appropriate? Yes; Fluid consistency: Nectar Thick  Diet effective now           EDUCATION NEEDS:   Not appropriate for education at this time  Skin:  Skin Assessment: Skin Integrity Issues:(closed incision to abdomen; scattered ecchymosis; left arm weeping)  Last BM:  09/23/2017  Height:   Ht Readings from Last 1 Encounters:  09/23/17 5' 5" (1.651 m)    Weight:   Wt Readings from Last 1 Encounters:  09/23/17 129 lb 6.6 oz (58.7 kg)    Ideal Body Weight:  56.8 kg  BMI:  Body mass index is 21.53 kg/m.  Estimated Nutritional Needs:   Kcal:  1465-1760 (25-30 kcal/kg)  Protein:  75-90 grams (1.3-1.5 grams/kg)  Fluid:  1.5 L/day (25 mL/kg)  Willey Blade, MS, RD, LDN Office: [phone] Pager: [phone] After Hours/Weekend Pager: [phone]

## 2017-09-28 NOTE — Progress Notes (Signed)
Please note patient has a pending outpatient PALLIATIVE referral at [facility]. She was hospitalized prior to being seen. CSW Ong made aware. Per chart note review patient does have a hospital Palliative consult in place.  Flo Shanks RN, BSN, [facility] [place], hospital liaison [phone]

## 2017-09-28 NOTE — Evaluation (Signed)
Clinical/Bedside Swallow Evaluation Patient Details  Name: Nina Hudson MRN: [id number] Date of Birth: 23-Oct-1945  Today's Date: 09/28/2017 Time: SLP Start Time (ACUTE ONLY): 0930 SLP Stop Time (ACUTE ONLY): 1018 SLP Time Calculation (min) (ACUTE ONLY): 48 min  Past Medical History:  Past Medical History:  Diagnosis Date  . Arthritis   . Cancer (HCC)    diffuse large B cell lymphoma  . Cancer ([place])    uterine  . Depression   . Diabetes mellitus without complication ([place])   . Dysrhythmia    A-fib  . Hypercholesteremia   . Hypertension   . Stroke [facility])    Past Surgical History:  Past Surgical History:  Procedure Laterality Date  . ABDOMINAL HYSTERECTOMY    . APPENDECTOMY    . BUNIONECTOMY Bilateral   . FRACTURE SURGERY Right    ankle  . MASS EXCISION N/A 09/28/2015   Procedure: EXCISION OF 2 CYSTIC MASSES FROM MEDIAL STERNUM;  Surgeon: Dereck Leep, MD;  Location: [facility] ORS;  Service: Orthopedics;  Laterality: N/A;  . rheumatoid nodual removal    . TUBAL LIGATION     HPI: Per admitting H&P   Nina Hudson  is a 72 y.o. female with a known history of diffuse large B cell lymphoma, uterine cancer, history of a fib on eliquis, diabetes, comes to the emergency room from [facility] where she was admitted on [phone]. Patient is being admitted type for acute on chronic hypoxic respiratory failure secondary to suspected pneumonia/G.I. bleed/acute on chronic anemia. Patient was intubated after admission.   Assessment / Plan / Recommendation Clinical Impression  Patient presents with functional swallowing abilities at bedside with NG tube in place; however is at risk for aspiration secondary to fatigue, SOB, and overall deconditioning. Patient tolerated all consistencies without s/s aspiration; however exam was limited due to fatigue. Oral mech exam revealed structures functioning adequately for PO intake. Slight oral transit delay secondary to fatigue, mild oral residue after the  swallow which was easily cleared with follow up swallows of thin liquid. Laryngeal elevation appeared adequate. Vocal quality remained clear after swallow. It should be noted patient has a wet congested cough prior to any PO's. In light of patient fatigue and overall deconditioning, recommend Dysphagia II diet with Nectar Thick liquids with strict aspiration precautions, may have ice chips and small sips of water when alert and mouth is clear. Recommend crush large meds in applesauce. SLP to f/u to advance and adjust as needed. SLP Visit Diagnosis: Dysphagia, oropharyngeal phase (R13.12)    Aspiration Risk    Mild to moderate   Diet Recommendation Dysphagia 2 (Fine chop);Nectar-thick liquid   Liquid Administration via: Cup Medication Administration: Crushed with puree Supervision: Full supervision/cueing for compensatory strategies Compensations: Minimize environmental distractions;Slow rate;Small sips/bites;Other (Comment)(allow periods of rest between bites; feed throughout the day) Postural Changes: Seated upright at 90 degrees;Remain upright for at least 30 minutes after po intake    Other  Recommendations Oral Care Recommendations: Oral care BID   Follow up Recommendations Other (comment)(to be determined)      Frequency and Duration min 2x/week  2 weeks       Prognosis Prognosis for Safe Diet Advancement: Good Barriers to Reach Goals: Other (Comment)(fatigue) Barriers/Prognosis Comment: Fatigue      Swallow Study   General Date of Onset: 09/23/17 Type of Study: Bedside Swallow Evaluation Diet Prior to this Study: NPO Respiratory Status: Nasal cannula History of Recent Intubation: Yes Length of Intubations (days): 4 days Behavior/Cognition: Alert;Cooperative;Pleasant  mood Oral Cavity Assessment: Within Functional Limits Oral Cavity - Dentition: Adequate natural dentition Vision: Functional for self-feeding Self-Feeding Abilities: Needs assist Patient Positioning:  Upright in bed Baseline Vocal Quality: Normal Volitional Cough: Wet;Congested    Oral/Motor/Sensory Function Overall Oral Motor/Sensory Function: Within functional limits   Ice Chips Ice chips: Within functional limits Presentation: Spoon   Thin Liquid Thin Liquid: Within functional limits Presentation: Cup;Spoon;Straw    Nectar Thick Nectar Thick Liquid: Within functional limits Presentation: Cup   Honey Thick     Puree     Solid   GO   Solid: Within functional limits Presentation: Self Fed        [name] 09/28/2017,10:19 AM

## 2017-09-28 NOTE — Progress Notes (Signed)
Pt being transferred to room 246. Report called to [place], Therapist, sports. Pt and belongings transferred to room 246 without incident.

## 2017-09-28 NOTE — Evaluation (Signed)
Occupational Therapy Evaluation Patient Details Name: Nina Hudson MRN: [id number] DOB: [birth year] Today's Date: 09/28/2017    History of Present Illness 72yo female pt presented to ER secondary to SOB, weakness, lethargy, noted with guaic positive stools; admitted with acute/chronic respiratory failure secondary to R LL PNA (requiring intubation 6/19-6/21), now on 2L supplemental O2. Of note, patient with recent hospitalization at outside hospital (6/2-6/14) due to acute cholecystitis/choledocholethiasis, status post ERCP with stent and stone removal and laprascopic cholecystectomy.  Course complicated by cystic artery bleed requiring exploratory laparotomy.  Discharge to STR due to significant weakness/deconditioning after extended hospitalization.   Clinical Impression   Pt seen for OT evaluation this date. Prior to hospital admission, pt was modified indep using 4WRW for mobility and requiring minimal assist for washing back during seated showers and able to perform toileting, dressing, and med mgt independently. Pt reports cousin drives and that a caregiver, Hassan Rowan, assists with cooking/cleaning. No family present to verify information provided. Pt lives with her spouse in a 1 story home with walk in shower 2 steps to enter w/ R rail.  Currently pt demonstrates impairments in activity tolerance, strength BUE/BLE, cognition, and balance requiring mod to max assist for ADL tasks from bed level. Pt declined bed mobility but agreeable to there-ex at bed level: Pt instructed in 1x10 act assist ROM BUE: shoulder flexion, shoulder abd, elbow flex/ext, grip; act assist ROM BLE heel slides; moderate encouragement required t/o to support participation.  Pt would benefit from skilled OT to address noted impairments and functional limitations (see below for any additional details) in order to maximize safety and independence while minimizing falls risk and caregiver burden.  Upon hospital discharge, recommend  pt discharge to [place].    Follow Up Recommendations  SNF    Equipment Recommendations  3 in 1 bedside commode    Recommendations for Other Services       Precautions / Restrictions Precautions Precautions: Fall Precaution Comments: NPO/NGT Restrictions Weight Bearing Restrictions: No      Mobility Bed Mobility Overal bed mobility: Needs Assistance             General bed mobility comments: pt declined, required max to dep assist previous date per chart review  Transfers                 General transfer comment: unsafe/unable    Balance                                           ADL either performed or assessed with clinical judgement   ADL Overall ADL's : Needs assistance/impaired Eating/Feeding: NPO Eating/Feeding Details (indicate cue type and reason): G tube Grooming: Moderate assistance;Minimal assistance   Upper Body Bathing: Maximal assistance;Bed level   Lower Body Bathing: Maximal assistance;Bed level   Upper Body Dressing : Bed level;Maximal assistance   Lower Body Dressing: Maximal assistance;Bed level       Toileting- Clothing Manipulation and Hygiene: Total assistance               Vision Baseline Vision/History: Wears glasses Wears Glasses: At all times Patient Visual Report: No change from baseline       Perception     Praxis      Pertinent Vitals/Pain Pain Assessment: No/denies pain Pain Intervention(s): Monitored during session     Hand Dominance Right   Extremity/Trunk Assessment Upper Extremity Assessment  Upper Extremity Assessment: Generalized weakness(grossly 3-/5 throughout; edema elbows distally, L worse than R.  BUE continue to be propped on pillows/elevated for edema management)   Lower Extremity Assessment Lower Extremity Assessment: Generalized weakness((grossly 2+ to 3-/5 throughout; notably atrophied)       Communication Communication Communication: No difficulties   Cognition  Arousal/Alertness: Awake/alert Behavior During Therapy: WFL for tasks assessed/performed;Flat affect Overall Cognitive Status: Within Functional Limits for tasks assessed                                 General Comments: Pt keeps eyes closed t/o session unless verbally requested. Follows commands. Oriented to self and remotely to situation. States we are at [facility].   General Comments       Exercises Other Exercises Other Exercises: Pt instructed in 1x10 act assist ROM BUE: shoulder flexion, shoulder abd, elbow flex/ext, grip; act assist ROM BLE heel slides; moderate encouragement required t/o to support participation   Shoulder Instructions      Home Living Family/patient expects to be discharged to:: Private residence Living Arrangements: Spouse/significant other Available Help at Discharge: Family Type of Home: House Home Access: Stairs to enter Technical brewer of Steps: 2 Entrance Stairs-Rails: Right Home Layout: One level     Bathroom Shower/Tub: Occupational psychologist: Handicapped height     [place]: Shower seat;Grab bars - tub/shower;Adaptive equipment Adaptive Equipment: [place]acher        Prior Functioning/Environment Level of Independence: Independent with assistive device(s);Needs assistance  Gait / Transfers Assistance Needed: Mod indep with 4WRW for ADLs, household and limited community mobilization; denies fall history. Per PT evaluation, family members in/out during PT evaluation suggest significant functional decline in previous year (after CVA), with very limited walking distances.  ADL's / Homemaking Assistance Needed: Pt endorses receiving assist from a caregiver Hassan Rowan for cooking/cleaning, cousin Jana Half does the driving, and gets assist to wash her back during seated showers from [place]. Pt reports being independent with med mgt, dressing, and toileting.            OT Problem List: Decreased strength;Decreased activity  tolerance;Decreased cognition;Impaired UE functional use;Cardiopulmonary status limiting activity;Decreased knowledge of use of DME or AE;Impaired balance (sitting and/or standing)      OT Treatment/Interventions: Self-care/ADL training;Balance training;Therapeutic exercise;Therapeutic activities;DME and/or AE instruction;Patient/family education;Cognitive remediation/compensation    OT Goals(Current goals can be found in the care plan section) Acute Rehab OT Goals Patient Stated Goal: to get stronger for Kennyth Lose (husband) OT Goal Formulation: With patient Time For Goal Achievement: 10/12/17 Potential to Achieve Goals: Good ADL Goals Pt Will Perform Grooming: sitting;with min assist(min assist for sitting balance, min assist for grooming) Pt Will Transfer to Toilet: with max assist;stand pivot transfer;bedside commode;with mod assist(LRAD for transfer)  OT Frequency: Min 1X/week   Barriers to D/C:            Co-evaluation              AM-PAC PT "6 Clicks" Daily Activity     Outcome Measure Help from another person eating meals?: Total(NPO, Gtube) Help from another person taking care of personal grooming?: A Lot Help from another person toileting, which includes using toliet, bedpan, or urinal?: Total Help from another person bathing (including washing, rinsing, drying)?: A Lot Help from another person to put on and taking off regular upper body clothing?: A Lot Help from another person to put on and taking off  regular lower body clothing?: A Lot 6 Click Score: 10   End of Session    Activity Tolerance: Patient limited by fatigue Patient left: in bed;with call bell/phone within reach;with bed alarm set  OT Visit Diagnosis: Other abnormalities of gait and mobility (R26.89);Muscle weakness (generalized) (M62.81)                Time: 0016-4290 OT Time Calculation (min): 17 min Charges:  OT General Charges $OT Visit: 1 Visit OT Evaluation $OT Eval Moderate Complexity: 1  Mod OT Treatments $Therapeutic Exercise: 8-22 mins   Jeni Salles, MPH, MS, OTR/L ascom [phone] 09/28/17, 10:03 AM

## 2017-09-28 NOTE — Progress Notes (Signed)
Pharmacy Electrolyte Monitoring Consult:  Pharmacy consulted to assist in monitoring and replacing electrolytes in this 72 y.o. female admitted on 09/23/2017 with Unresponsive   Labs:  Sodium (mmol/L)  Date Value  09/28/2017 138  04/11/2014 138   Potassium (mmol/L)  Date Value  09/28/2017 3.5  04/11/2014 3.9   Magnesium (mg/dL)  Date Value  09/27/2017 1.9  06/07/2012 1.8   Phosphorus (mg/dL)  Date Value  09/26/2017 3.1  01/16/2012 3.0   Calcium (mg/dL)  Date Value  09/28/2017 7.9 (L)   Calcium, Total (mg/dL)  Date Value  04/11/2014 8.1 (L)   Albumin (g/dL)  Date Value  09/25/2017 2.3 (L)  03/01/2013 3.4   Corrected Calcium: 9.2  Assessment/Plan: Will order potassium 56mEq PO x 1 to maintain potassium ~ 4. Will continue with potassium 28mEq PO Daily as ordered. Will recheck electrolytes with am labs.   Pharmacy will continue to monitor and adjust per consult.   MLS 09/28/2017 4:08 PM

## 2017-09-29 ENCOUNTER — Inpatient Hospital Stay: Payer: Medicare Other

## 2017-09-29 LAB — CBC WITH DIFFERENTIAL/PLATELET
BASOS ABS: 0 10*3/uL (ref 0–0.1)
BASOS PCT: 0 %
Eosinophils Absolute: 0 10*3/uL (ref 0–0.7)
Eosinophils Relative: 0 %
HEMATOCRIT: 35.8 % (ref 35.0–47.0)
Hemoglobin: 11.8 g/dL — ABNORMAL LOW (ref 12.0–16.0)
Lymphocytes Relative: 33 %
Lymphs Abs: 1.7 10*3/uL (ref 1.0–3.6)
MCH: 30.7 pg (ref 26.0–34.0)
MCHC: 32.9 g/dL (ref 32.0–36.0)
MCV: 93.5 fL (ref 80.0–100.0)
MONO ABS: 0.4 10*3/uL (ref 0.2–0.9)
Monocytes Relative: 8 %
Neutro Abs: 3.1 10*3/uL (ref 1.4–6.5)
Neutrophils Relative %: 59 %
PLATELETS: 139 10*3/uL — AB (ref 150–440)
RBC: 3.82 MIL/uL (ref 3.80–5.20)
RDW: 21.4 % — AB (ref 11.5–14.5)
WBC: 5.3 10*3/uL (ref 3.6–11.0)

## 2017-09-29 LAB — GLUCOSE, CAPILLARY
GLUCOSE-CAPILLARY: 284 mg/dL — AB (ref 70–99)
GLUCOSE-CAPILLARY: 209 mg/dL — AB (ref 70–99)
GLUCOSE-CAPILLARY: 235 mg/dL — AB (ref 70–99)
Glucose-Capillary: 124 mg/dL — ABNORMAL HIGH (ref 70–99)
Glucose-Capillary: 254 mg/dL — ABNORMAL HIGH (ref 70–99)
Glucose-Capillary: 254 mg/dL — ABNORMAL HIGH (ref 70–99)
Glucose-Capillary: 86 mg/dL (ref 70–99)

## 2017-09-29 LAB — BASIC METABOLIC PANEL
ANION GAP: 7 (ref 5–15)
BUN: 24 mg/dL — ABNORMAL HIGH (ref 8–23)
CALCIUM: 8.4 mg/dL — AB (ref 8.9–10.3)
CO2: 36 mmol/L — AB (ref 22–32)
Chloride: 95 mmol/L — ABNORMAL LOW (ref 98–111)
Creatinine, Ser: 0.55 mg/dL (ref 0.44–1.00)
GFR calc non Af Amer: >60 mL/min (ref >60)
GLUCOSE: 266 mg/dL — AB (ref 70–99)
POTASSIUM: 3.7 mmol/L (ref 3.5–5.1)
Sodium: 138 mmol/L (ref 135–145)

## 2017-09-29 LAB — PHOSPHORUS: Phosphorus: 1.5 mg/dL — ABNORMAL LOW (ref 2.5–4.6)

## 2017-09-29 LAB — MAGNESIUM: Magnesium: 2 mg/dL (ref 1.7–2.4)

## 2017-09-29 MED ORDER — DEXTROSE 5 % IV SOLN
15.0000 mmol | Freq: Once | INTRAVENOUS | Status: AC
  Administered 2017-09-29: 15 mmol via INTRAVENOUS
  Filled 2017-09-29: qty 5

## 2017-09-29 MED ORDER — PIPERACILLIN-TAZOBACTAM 3.375 G IVPB
3.3750 g | Freq: Three times a day (TID) | INTRAVENOUS | Status: AC
  Administered 2017-09-29 – 2017-10-02 (×9): 3.375 g via INTRAVENOUS
  Filled 2017-09-29 (×9): qty 50

## 2017-09-29 MED ORDER — OSMOLITE 1.5 CAL PO LIQD: 1000.0000 mL | ORAL | Status: DC

## 2017-09-29 MED ORDER — POTASSIUM CHLORIDE 20 MEQ PO PACK: 40.0000 meq | PACK | Freq: Once | ORAL | Status: DC

## 2017-09-29 NOTE — Progress Notes (Signed)
Pt is lethargic and breathing appears shallow. Pt still arouses when spoken to but falls back asleep. Lung sounds diminished with slight wheezing. Family states that husband is aware of pt status and that he is coming tonight. Vital signs stable, O2 sats 99-100% on 2L. Will continue to monitor.

## 2017-09-29 NOTE — Progress Notes (Addendum)
Inpatient Diabetes Program Recommendations  AACE/ADA: New Consensus Statement on Inpatient Glycemic Control (2015)  Target Ranges:  Prepandial:   less than 140 mg/dL      Peak postprandial:   less than 180 mg/dL (1-2 hours)      Critically ill patients:  140 - 180 mg/dL   Results for Nina Hudson, Nina Hudson (MRN [id number]) as of 09/29/2017 11:08  Ref. Range 09/27/2017 23:27 09/28/2017 03:40 09/28/2017 07:29 09/28/2017 11:52 09/28/2017 16:24 09/28/2017 21:04  Glucose-Capillary Latest Ref Range: 65 - 99 mg/dL 170 (H)  3 units NOVOLOG 189 (H)  3 units NOVOLOG 235 (H)  5 units NOVOLOG 206 (H)  5 units NOVOLOG 203 (H)  5 units NOVOLOG 161 (H)  3 units NOVOLOG   Results for Nina Hudson, Nina Hudson (MRN [id number]) as of 09/29/2017 11:08  Ref. Range 09/29/2017 00:54 09/29/2017 04:11 09/29/2017 08:08  Glucose-Capillary Latest Ref Range: 70 - 99 mg/dL 254 (H)  8 units NOVOLOG 284 (H)  8 units NOVOLOG 209 (H)  5 units NOVOLOG     Home DM Meds: Glipizide 5 mg daily                             Metformin 500 mg BID  Current Insulin Orders: Lantus 10 units QHS                                       Novolog Moderate Correction Scale/ SSI (0-15 units) Q4 hours                                       Glipizide 5 mg daily     MD- Per review of chart, patient did NOT receive Lantus last PM.  Unsure why??  MAR states "Medication not available".  Not sure why medication was not requested to be sent to nursing unit??  CBGs elevated >200 mg/dl consistently.  Note patient getting Osmolite 1.5 CAL tube feedings at 45 cc/hour.  1. Please consider starting Novolog Tube Feed Coverage: Novolog 3 units Q4 hours (Add Hold Parameters of: Hold if Tube Feeds held for any reason or if Tube Feeds stopped)  2. Please consider stopping Glipizide for now as well     --Will follow patient during hospitalization--  Wyn Quaker RN, MSN, CDE Diabetes Coordinator Inpatient Glycemic Control Team Team Pager:  [phone] (8a-5p)

## 2017-09-29 NOTE — Progress Notes (Addendum)
Pharmacy Electrolyte Monitoring Consult:  Pharmacy consulted to assist in monitoring and replacing electrolytes in this 72 y.o. female admitted on 09/23/2017 with PNA  Labs:  Sodium (mmol/L)  Date Value  09/29/2017 138  04/11/2014 138   Potassium (mmol/L)  Date Value  09/29/2017 3.7  04/11/2014 3.9   Magnesium (mg/dL)  Date Value  09/27/2017 1.9  06/07/2012 1.8   Phosphorus (mg/dL)  Date Value  09/26/2017 3.1  01/16/2012 3.0   Calcium (mg/dL)  Date Value  09/29/2017 8.4 (L)   Calcium, Total (mg/dL)  Date Value  04/11/2014 8.1 (L)   Albumin (g/dL)  Date Value  09/25/2017 2.3 (L)  03/01/2013 3.4   Corrected Calcium: 9.2  Assessment/Plan: Patient received 56mEq po KCl 6/24 and potassium improved slightly but not to goal. Additionally phosphorous level is 1.5mg /dL. Will order potassium phosphate IV 15mmol, which will also provide 23mEq of potassium. Will continue with potassium 71mEq PO Daily as ordered. Will recheck electrolytes with am labs.                        Vallery Sa, PharmD

## 2017-09-29 NOTE — Progress Notes (Signed)
Nina Hudson at [place] NAME: Nina Hudson    MR#:  [id number]  DATE OF BIRTH:  31-Aug-1945  SUBJECTIVE:  patient extubated. He appears week. Continue tube feeding secondary to poor PO intake. History of trouble swallowing speech eval done. Patient extremity deconditioned. Malnourished.  have some rapid shallow breaths today,.  REVIEW OF SYSTEMS:   Review of Systems  Constitutional: Negative for chills, fever and weight loss.  HENT: Negative for ear discharge, ear pain and nosebleeds.   Eyes: Negative for blurred vision, pain and discharge.  Respiratory: Negative for sputum production, shortness of breath, wheezing and stridor.   Cardiovascular: Negative for chest pain, palpitations, orthopnea and PND.  Gastrointestinal: Negative for abdominal pain, diarrhea, nausea and vomiting.  Genitourinary: Negative for frequency and urgency.  Musculoskeletal: Negative for back pain and joint pain.  Neurological: Positive for weakness. Negative for sensory change, speech change and focal weakness.  Psychiatric/Behavioral: Negative for depression and hallucinations. The patient is not nervous/anxious.    Tolerating Diet:ng feeding Tolerating PT: pending  DRUG ALLERGIES:   Allergies  Allergen Reactions  . Sulfa Antibiotics Other (See Comments)    "I get real achey" Other reaction(s): Muscle Pain, Other (See Comments)  General body aches andpain.     VITALS:  Blood pressure 119/78, pulse 83, temperature 97.8 F (36.6 C), temperature source Oral, resp. rate 20, height 5\' 5"  (1.651 m), weight 58.7 kg (129 lb 6.6 oz), SpO2 98 %.  PHYSICAL EXAMINATION:   Physical Exam  GENERAL:  72 y.o.-year-old patient lying in the bed with no acute distress. Very deconditioned, anasarca EYES: Pupils equal, round, reactive to light and accommodation. No scleral icterus. Extraocular muscles intact.  HEENT: Head atraumatic, normocephalic. Oropharynx and  nasopharynx clear. Ng+ NECK:  Supple, no jugular venous distention. No thyroid enlargement, no tenderness.  LUNGS: decreased sound on right side, no wheezing, rales, rhonchi. positive use of accessory muscles of respiration.  CARDIOVASCULAR: S1, S2 normal. No murmurs, rubs, or gallops.  ABDOMEN: Soft, nontender, nondistended. Bowel sounds present. No organomegaly or mass. Foley in place, dark red to brown urine in the container,. EXTREMITIES: No cyanosis, clubbing Upper extremity edema.    NEUROLOGIC: Cranial nerves II through XII are intact. No focal Motor or sensory deficits b/l.very weak   PSYCHIATRIC:  patient is alert and oriented x 2.  SKIN: No obvious rash, lesion, or ulcer. Bruises+  LABORATORY PANEL:  CBC Recent Labs  Lab 09/29/17 0445  WBC 5.3  HGB 11.8*  HCT 35.8  PLT 139*    Chemistries  Recent Labs  Lab 09/25/17 0513  09/29/17 0445  NA 140   < > 138  K 3.7   < > 3.7  CL 97*   < > 95*  CO2 33*   < > 36*  GLUCOSE 199*   < > 266*  BUN 17   < > 24*  CREATININE 0.65   < > 0.55  CALCIUM 7.6*   < > 8.4*  MG  --    < > 2.0  AST 32  --   --   ALT 18  --   --   ALKPHOS 111  --   --   BILITOT 1.7*  --   --    < > = values in this interval not displayed.   Cardiac Enzymes Recent Labs  Lab 09/23/17 2128  TROPONINI 0.04*   RADIOLOGY:  Dg Chest 1 View  Result Date: 09/29/2017 CLINICAL DATA:  Hypoxia, shortness of breath, and weakness. Limited ability to give history. History of lymphoma, uterine malignancy, diabetes, and previous CVA. Former smoker. EXAM: CHEST  1 VIEW COMPARISON:  Portable chest x-ray of September 25, 2017 FINDINGS: The lungs are well-expanded. There is increased density at the right lung base with partial obscuration of the hemidiaphragm. A small right pleural effusion is suspected. The left lung exhibits no infiltrate or effusion. The interstitial markings of both lungs are coarse. The heart and pulmonary vascularity are normal. A feeding tube is  present with the radiodense tube tip projecting in the region of the gastric cardia with some coiling of the tube more inferiorly in the gastric body. IMPRESSION: Worsening atelectasis or pneumonia at the right lung base with small right pleural effusion. Underlying COPD. No CHF. Electronically Signed   By: David  Martinique M.D.   On: 09/29/2017 14:22   ASSESSMENT AND PLAN:   [name] a known history of diffuse large B cell lymphoma, uterine cancer, history of a fib on eliquis, diabetes, comes to the emergency room from [facility] where she was admitted on [phone]. Patient is being admitted type for acute on chronic hypoxic respiratory failure secondary to suspected pneumonia/G.I. bleed/acute on chronic anemia.  1.acute on chronic hypoxic respiratory failure secondary to possible right lower lobe pneumonia -PE has been ruled out - pneumonia positive tracheal aspirate Gram stain --growing pseudomonas - Continue IV antibiotic with Zosyn--consider po abx once sensitivities are back - as worsening on Xray chest- will cont zosyn and stop NG and oral feeds. - Tried to talk to husband regarding palliative care. He did not reply the phone call.  2.  Acute encephalopathy EEG shows slowing with sleep state Likely due to pneumonia  3. acute on chronic anemia -patient on eliquis -holding it at present -Status post 1 unit of transfusion -Hemoglobin stable  3.History of ERCP with stone removal and stent placement along with lap Coley complicated by cystic artery bleed requiring exploratory laparotomy -this was done at [facility] on 6/5 /2019 -surgicalconsultation if needed  4.Diabetes sliding scale insulin for now  5.  A. fib with RVR - stable with oral metoprolol and cardizem.  6.DVT prophylaxis SCD  and Ted's and eliquis   Hold eliquis as there is hematuria.  7. Poor PO intake with malnutrition - currently getting NG tube feeding. - Therapy to evaluate  swallow and start oral diet per recommendation thereafter - Cont tube feeds to provide enough nutrition. - Worsening pneumonia, may be more aspirations- so hold feeding now.  Case discussed with Care Management/Social Worker. Management plans discussed with the patient, family and they are in agreement.  CODE STATUS: full  DVT Prophylaxis: eliquis  TOTAL TIME TAKING CARE OF THIS PATIENT: *40* minutes.  >50% time spent on counselling and coordination of care Seen pt twice, and tried calling her husband to discuss about her condiition and code status.  POSSIBLE D/C IN few DAYS, DEPENDING ON CLINICAL CONDITION.  Note: This dictation was prepared with Dragon dictation along with smaller phrase technology. Any transcriptional errors that result from this process are unintentional.  Vaughan Basta M.D on 09/29/2017 at 5:59 PM  Between 7am to 6pm - Pager - [phone]  After 6pm go to [email or web address] - password [email or web address] [facility]  Office  [phone]  CC: Primary care physician; Derinda Late, MDPatient ID: Theodoro Parma, female   DOB: [birth year], 72 y.o.   MRN: [id number]

## 2017-09-29 NOTE — Progress Notes (Signed)
[facility] [place] NAME: Nina Hudson    MR#:  [id number]  DATE OF BIRTH:  [birth year]  SUBJECTIVE:  patient extubated. He appears week. Continue tube feeding secondary to poor PO intake. History of trouble swallowing speech eval done. Patient extremity deconditioned. malnourished.  REVIEW OF SYSTEMS:   Review of Systems  Constitutional: Negative for chills, fever and weight loss.  HENT: Negative for ear discharge, ear pain and nosebleeds.   Eyes: Negative for blurred vision, pain and discharge.  Respiratory: Negative for sputum production, shortness of breath, wheezing and stridor.   Cardiovascular: Negative for chest pain, palpitations, orthopnea and PND.  Gastrointestinal: Negative for abdominal pain, diarrhea, nausea and vomiting.  Genitourinary: Negative for frequency and urgency.  Musculoskeletal: Negative for back pain and joint pain.  Neurological: Positive for weakness. Negative for sensory change, speech change and focal weakness.  Psychiatric/Behavioral: Negative for depression and hallucinations. The patient is not nervous/anxious.    Tolerating Diet:ng feeding Tolerating PT: pending  DRUG ALLERGIES:   Allergies  Allergen Reactions  . Sulfa Antibiotics Other (See Comments)    "I get real achey" Other reaction(s): Muscle Pain, Other (See Comments)  General body aches andpain.     VITALS:  Blood pressure 124/84, pulse 80, temperature 97.6 F (36.4 C), temperature source Oral, resp. rate 20, height 5\' 5"  (1.651 m), weight 58.7 kg (129 lb 6.6 oz), SpO2 100 %.  PHYSICAL EXAMINATION:   Physical Exam  GENERAL:  72 y.o.-year-old patient lying in the bed with no acute distress. Very deconditioned, anasarca EYES: Pupils equal, round, reactive to light and accommodation. No scleral icterus. Extraocular muscles intact.  HEENT: Head atraumatic, normocephalic. Oropharynx and nasopharynx clear. Ng+ NECK:  Supple, no  jugular venous distention. No thyroid enlargement, no tenderness.  LUNGS: Normal breath sounds bilaterally, no wheezing, rales, rhonchi. No use of accessory muscles of respiration.  CARDIOVASCULAR: S1, S2 normal. No murmurs, rubs, or gallops.  ABDOMEN: Soft, nontender, nondistended. Bowel sounds present. No organomegaly or mass.  EXTREMITIES: No cyanosis, clubbing Upper extremity edema.    NEUROLOGIC: Cranial nerves II through XII are intact. No focal Motor or sensory deficits b/l.very weak   PSYCHIATRIC:  patient is alert and oriented x 2.  SKIN: No obvious rash, lesion, or ulcer. Bruises+  LABORATORY PANEL:  CBC Recent Labs  Lab 09/29/17 0445  WBC 5.3  HGB 11.8*  HCT 35.8  PLT 139*    Chemistries  Recent Labs  Lab 09/25/17 0513  09/27/17 0646  09/29/17 0445  NA 140   < > 139   < > 138  K 3.7   < > 3.5   < > 3.7  CL 97*   < > 94*   < > 95*  CO2 33*   < > 38*   < > 36*  GLUCOSE 199*   < > 222*   < > 266*  BUN 17   < > 22*   < > 24*  CREATININE 0.65   < > 0.63   < > 0.55  CALCIUM 7.6*   < > 7.8*   < > 8.4*  MG  --    < > 1.9  --   --   AST 32  --   --   --   --   ALT 18  --   --   --   --   ALKPHOS 111  --   --   --   --  BILITOT 1.7*  --   --   --   --    < > = values in this interval not displayed.   Cardiac Enzymes Recent Labs  Lab 09/23/17 2128  TROPONINI 0.04*   RADIOLOGY:  No results found. ASSESSMENT AND PLAN:   CarolMcCauleyis a73 y.o.femalewith a known history of diffuse large B cell lymphoma, uterine cancer, history of a fib on eliquis, diabetes, comes to the emergency room from [facility] where she was admitted on [phone]. Patient is being admitted type for acute on chronic hypoxic respiratory failure secondary to suspected pneumonia/G.I. bleed/acute on chronic anemia.  1.acute on chronic hypoxic respiratory failure secondary to possible right lower lobe pneumonia -PE has been ruled out - pneumonia positive tracheal aspirate Gram stain  --growing pseudomonas - Continue IV antibiotic with Zosyn--consider po abx once sensitivities are back  2.  Acute encephalopathy EEG shows slowing with sleep state Likely due to pneumonia  3. acute on chronic anemia -patient on eliquis -holding it at present -Status post 1 unit of transfusion -Hemoglobin stable  3.History of ERCP with stone removal and stent placement along with lap Coley complicated by cystic artery bleed requiring exploratory laparotomy -this was done at [facility] on 6/5 /2019 -surgicalconsultation if needed  4.Diabetes sliding scale insulin for now  5.  A. fib with RVR - continue amiodarone drip, still fluctuating in the 110's- stabilizing  6.DVT prophylaxis SCD  and Ted's and eliquis  7. Poor PO intake with malnutrition -currently getting NG tube feeding. -Therapy to evaluate swallow and start oral diet per recommendation thereafter - Cont tub feeds to provide enough nutrition.    Case discussed with Care Management/Social Worker. Management plans discussed with the patient, family and they are in agreement.  CODE STATUS: full  DVT Prophylaxis: eliquis  TOTAL TIME TAKING CARE OF THIS PATIENT: *30* minutes.  >50% time spent on counselling and coordination of care  POSSIBLE D/C IN few DAYS, DEPENDING ON CLINICAL CONDITION.  Note: This dictation was prepared with Dragon dictation along with smaller phrase technology. Any transcriptional errors that result from this process are unintentional.  Vaughan Basta M.D on 09/29/2017 at 8:20 AM  Between 7am to 6pm - Pager - [phone]  After 6pm go to [email or web address] - password [email or web address] [facility]  Office  [phone]  CC: Primary care physician; Derinda Late, MDPatient ID: Theodoro Parma, female   DOB: December 16, 1945, 72 y.o.   MRN: [id number]

## 2017-09-29 NOTE — Progress Notes (Signed)
Daily Progress Note   Patient Name: Nina Hudson       Date: 09/29/2017 DOB: [birth year]  Age: 72 y.o. MRN#: [id number] Attending Physician: Vaughan Basta, * Primary Care Physician: Derinda Late, MD Admit Date: 09/23/2017  Reason for Consultation/Follow-up: Establishing goals of care and Psychosocial/spiritual support  Subjective: Patient responds yes and no to questions, communication limited by lethargy/shortness of breath. Per RN, attending paged to come evaluate breathing d/t tachypnea and increased work of breathing.   Length of Stay: 6  Current Medications: Scheduled Meds:  . apixaban  5 mg Per Tube BID  . diltiazem  30 mg Oral Q6H  . feeding supplement (PRO-STAT SUGAR FREE 64)  30 mL Per Tube Daily  . free water  100 mL Per Tube Q6H  . glipiZIDE  5 mg Oral QAC breakfast  . insulin aspart  0-15 Units Subcutaneous Q4H  . insulin glargine  10 Units Subcutaneous QHS  . mouth rinse  15 mL Mouth Rinse BID  . metoprolol tartrate  50 mg Per Tube BID  . pantoprazole (PROTONIX) IV  40 mg Intravenous Q12H  . potassium chloride  20 mEq Oral Daily    Continuous Infusions: . feeding supplement (OSMOLITE 1.5 CAL) 1,000 mL (09/28/17 1502)  . potassium PHOSPHATE IVPB (in mmol)      PRN Meds: acetaminophen **OR** acetaminophen, ipratropium-albuterol, polyethylene glycol, sennosides  Physical Exam   Constitutional: She is lethargic. Responds to voice, answers yes/no questions.  HENT:  Head: Normocephalic and atraumatic.  Cardiovascular: An irregular rhythm present.  Pulmonary/Chest: Diminished breath sounds. Tachypneic. Shallow breathing.  Abdominal: Soft. Bowel sounds are normal.  Musculoskeletal: She exhibits edema.  Neurological:  She is lethargic. Responds to voice, answers  yes/no questions Skin: Skin is warm and Hudson.          Vital Signs: BP (!) 138/107 (BP Location: Right Arm)   Pulse 97   Temp 98.2 F (36.8 C) (Oral)   Resp 20   Ht 5\' 5"  (1.651 m)   Wt 58.7 kg (129 lb 6.6 oz)   SpO2 98%   BMI 21.53 kg/m  SpO2: SpO2: 98 % O2 Device: O2 Device: Nasal Cannula O2 Flow Rate: O2 Flow Rate (L/min): 2 L/min  Intake/output summary:   Intake/Output Summary (Last 24 hours) at 09/29/2017 1311 Last data filed at 09/29/2017 0700 Gross per 24 hour  Intake 807 ml  Output 1300 ml  Net -493 ml   LBM: Last BM Date: 09/29/17 Baseline Weight: Weight: 59 kg (130 lb) Most recent weight: Weight: 58.7 kg (129 lb 6.6 oz)       Palliative Assessment/Data: PPS 30%    Flowsheet Rows     Most Recent Value  Intake Tab  Referral Department  Critical care  Unit at Time of Referral  ICU  Palliative Care Primary Diagnosis  Sepsis/Infectious Disease  Date Notified  09/23/17  Palliative Care Type  New Palliative care  Reason for referral  Clarify Goals of Care  Date of Admission  09/23/17  Date first seen by Palliative Care  09/24/17  # of days Palliative referral response time  1 Day(s)  # of days IP prior to Palliative referral  0  Clinical Assessment  Palliative Performance Scale Score  30%  Psychosocial & Spiritual Assessment  Palliative Care Outcomes  Palliative Care Outcomes  Provided psychosocial or spiritual support, Counseled regarding hospice, ACP counseling assistance      Patient Active Problem List   Diagnosis Date Noted  . Goals of care, counseling/discussion   . Palliative care by specialist   . Pneumonia 09/23/2017  . Altered mental status 09/23/2017  . Acute respiratory failure with hypoxia ([place]) 09/23/2017  . Pressure injury of skin 09/23/2017    Palliative Care Assessment & Plan   HPI: 72 y.o. 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 Per NP note from facility, patient was found minimally responsive with tachypnea and tachycardia, mottled legs. CXR at facility revealed pna vs edema. Husband was called and he wanted full code and transfer to hospital. Patient was supposed to be seen by palliative care at the facility but the visit had not been completed yet. Patient was recently hospitalized at [facility] 0/9-3/26ZTI complicated cholecystitis with cholecystectomy. She underwent an ERCP with stent placement and stone extraction with lap chole. She developed hypotension and bleeding postop requiring emergent transport back to the OR the same day, and wasfound to have hemoperitoneum due to clip falling off cystic artery resulting in cystic artery stump bleed requiring ligation. She was discharged from [facility] on 09/18/17 to the [facility] for rehab. Since discharge from [facility] she has been weak and fatigued with minimal PO intake. Upon arrival to [facility] pt required intubation d/t agonal respirations. Found to be guaiac positive and hgb 8.4 down from 10.2. Received 1 unit PRBCs. Lactic acid 7.5. CT abd from 6/20 revealed small amount of perihepatic and pelvic ascites and stable placement of patent metallic biliary stent. She was extubated 6/21. PMT consulted for [place].   Assessment: Follow up with patient today - upon my assessment patient is lethargic, tachypneic, with increased work of breathing. RN informs me she has just paged attending to come evaluate patient.   Patient unable to participate in [place] conversation.   I made multiple attempts to speak with patient's husband - calling both numbers listed twice. I was able to speak to patient's son and daughter. They are both out of town. I shared with them my concerns about their mother's condition -  described increased work of breathing. I shared with them the need to address code status with patient's spouse. Per my previous discussions with patient's children, patient would not want to be intubated. They tell me their father is reluctant to meet with me because he is overwhelmed. He is also ill - recovering from surgery and sepsis.  Spoke with SLP regarding patient and continued tube feeding. Will keep for now d/t acute decompensation. Will make NPO. Need to address with spouse.   As far as functional and nutritional status, pt has had a steady decline over the past year. She had a stroke 1-2 years ago that impacted her functional status and since then she has been declining. Her children describe her as tired and depressed most of the time and feel she has "no will to live". After her hospitalization at [facility] this month she significantly declined. At rehab, she would not eat or  get out of bed and seemed exhausted everyday. Her children feel that she is "ready to die".  I shared with them my concerns about her long term prognosis given her severe debility prior to this hospitalization.   Questions and concerns were addressed. The family was encouraged to call with questions or concerns.   Recommendations/Plan:  Need to have [place] meeting with patient's husband (he is not at hospital and will not respond to multiple calls), need to discuss code status - per children, her wishes would likely be DNR  Will follow up with family/patient to complete MOST form when appropriate  If discharged, will need outpatient palliative at least  PMT will follow  Code Status:  Full code  Prognosis:   Unable to determine poor long-term prognosis d/t functional declines and failure to thrive  Discharge Planning:  To Be Determined - likely SNF rehab with palliative if no acute decompensation  Care plan was discussed with RN, Dr. Anselm Jungling, patient's son and daughter, speech therapist  Thank you for  allowing the Palliative Medicine Team to assist in the care of this patient.   Total Time 40 minutes  Prolonged Time Billed  no      Greater than 50%  of this time was spent counseling and coordinating care related to the above assessment and plan.  Juel Burrow, DNP, [facility] Palliative Medicine Team Team Phone # [phone]  Pager [phone]

## 2017-09-29 NOTE — Progress Notes (Signed)
PT Cancellation Note  Patient Details Name: REANN DOBIAS MRN: [id number] DOB: [birth year]   Cancelled Treatment:    Reason Eval/Treat Not Completed: Patient's level of consciousness Spoke with nursing who reports pt has been very lethargic all day and that we aren't likely to get her to do anything today pt has been very lethargic all day.  "She may open her eyes, but she's been falling right back asleep."   Kreg Shropshire, DPT 09/29/2017, 4:47 PM

## 2017-09-29 NOTE — Consult Note (Signed)
Pharmacy Antibiotic Note  Nina Hudson is a 72 y.o. female admitted on 09/23/2017 with pneumonia.  Pharmacy has been consulted for zosyn dosing.  Plan: Zosyn 3.375g IV q8h (4 hour infusion).  Pt chest x-ray shows worsening PNA vs atelectasis. Respiratory cx from 6/19 growing pseudomonas sensitive to zosyn. Pt has had 7 days of zosyn. Per Dr. Arcola Jansky would like 3 more days.  Height: 5\' 5"  (165.1 cm) Weight: 129 lb 6.6 oz (58.7 kg) IBW/kg (Calculated) : 57  Temp (24hrs), Avg:97.8 F (36.6 C), Min:97.5 F (36.4 C), Max:98.2 F (36.8 C)  Recent Labs  Lab 09/23/17 1323  09/24/17 0148 09/24/17 0540 09/24/17 1010 09/24/17 1425  09/25/17 0513 09/26/17 0641 09/26/17 0746 09/27/17 0646 09/28/17 0416 09/29/17 0445  WBC 6.8  --   --  6.6  --   --   --   --   --  4.6  --   --  5.3  CREATININE 0.70  --  0.71 0.78  --   --    < > 0.65 0.52  --  0.63 0.46 0.55  LATICACIDVEN  --    < > 4.0*  --  2.6* 2.6*  --  2.7* 1.2  --   --   --   --    < > = values in this interval not displayed.    Estimated Creatinine Clearance: 56.4 mL/min (by C-G formula based on SCr of 0.55 mg/dL).    Allergies  Allergen Reactions  . Sulfa Antibiotics Other (See Comments)    "I get real achey" Other reaction(s): Muscle Pain, Other (See Comments)  General body aches andpain.      Microbiology results: Recent Results (from the past 240 hour(s))  Blood Culture (routine x 2)     Status: None   Collection Time: 09/23/17  1:26 PM  Result Value Ref Range Status   Specimen Description BLOOD LEFT ANTECUBITAL  Final   Special Requests   Final    BOTTLES DRAWN AEROBIC AND ANAEROBIC Blood Culture results may not be optimal due to an excessive volume of blood received in culture bottles   Culture   Final    NO GROWTH 5 DAYS Performed at [facility], [address] [place], [place] [address]    Report Status 09/28/2017 FINAL  Final  Blood Culture (routine x 2)     Status: None   Collection  Time: 09/23/17  1:27 PM  Result Value Ref Range Status   Specimen Description BLOOD RIGHT ANTECUBITAL  Final   Special Requests   Final    BOTTLES DRAWN AEROBIC AND ANAEROBIC Blood Culture adequate volume   Culture   Final    NO GROWTH 5 DAYS Performed at [facility], [address] [place], [place] [address]    Report Status 09/28/2017 FINAL  Final  Urine culture     Status: None   Collection Time: 09/23/17  1:27 PM  Result Value Ref Range Status   Specimen Description   Final    URINE, RANDOM Performed at [facility], [address], [place], [place] [address]    Special Requests   Final    NONE Performed at [facility], [address], [place], [place] [address]    Culture   Final    NO GROWTH Performed at [facility], [place] [address], [place], [place] [address]    Report Status 09/24/2017 FINAL  Final  Culture, respiratory (NON-Expectorated)     Status: None  Collection Time: 09/23/17  2:11 PM  Result Value Ref Range Status   Specimen Description   Final    TRACHEAL ASPIRATE Performed at [facility], [address] [place], [place] [address]    Special Requests   Final    NONE Performed at [facility], [address] [place], [place] [address]    Gram Stain   Final    ABUNDANT WBC PRESENT, PREDOMINANTLY PMN FEW GRAM POSITIVE COCCI RARE GRAM POSITIVE RODS RARE GRAM NEGATIVE COCCOBACILLI Performed at [facility], [place] [address], [place], [place] [address]    Culture MODERATE PSEUDOMONAS AERUGINOSA  Final   Report Status 09/27/2017 FINAL  Final   Organism ID, Bacteria PSEUDOMONAS AERUGINOSA  Final      Susceptibility   Pseudomonas aeruginosa - MIC*    CEFTAZIDIME 2 SENSITIVE Sensitive     CIPROFLOXACIN <=0.25 SENSITIVE Sensitive     GENTAMICIN <=1 SENSITIVE Sensitive     IMIPENEM <=0.25 SENSITIVE Sensitive     PIP/TAZO <=4 SENSITIVE Sensitive     CEFEPIME <=1 SENSITIVE Sensitive     *  MODERATE PSEUDOMONAS AERUGINOSA  MRSA PCR Screening     Status: None   Collection Time: 09/23/17  6:17 PM  Result Value Ref Range Status   MRSA by PCR NEGATIVE NEGATIVE Final    Comment:        The GeneXpert MRSA Assay (FDA approved for NASAL specimens only), is one component of a comprehensive MRSA colonization surveillance program. It is not intended to diagnose MRSA infection nor to guide or monitor treatment for MRSA infections. Performed at [facility], [address] [place], [place] [address]   Culture, respiratory (NON-Expectorated)     Status: None   Collection Time: 09/23/17  8:59 PM  Result Value Ref Range Status   Specimen Description   Final    TRACHEAL ASPIRATE Performed at [facility], [address], [place], [place] [address]    Special Requests   Final    Normal Performed at [facility], [address] [place], [place] [address]    Gram Stain   Final    FEW WBC PRESENT, PREDOMINANTLY PMN RARE GRAM POSITIVE COCCI    Culture   Final    Consistent with normal respiratory flora. Performed at [facility], [place] [address], [place], [place] [address]    Report Status 09/26/2017 FINAL  Final     Thank you for allowing pharmacy to be a part of this patient's care.  Ramond Dial, Pharm.D, BCPS Clinical Pharmacist  09/29/2017 5:01 PM

## 2017-09-29 NOTE — Progress Notes (Signed)
Pt took medications crushed in applesauce. Pt ate three bites of applesauce and a few sips of nectar thick water. Pt encouraged to eat a few more bites but patient refused. Pt offered some of her food from her dinner tray but patient refused as well.

## 2017-09-29 NOTE — Progress Notes (Signed)
Patient responding verbally but breathing is shallow and tachypneic. MD paged to come evaluate patient. [place] held this AM d/t dark, red tinged urine. MD made aware.

## 2017-09-29 NOTE — NC FL2 (Signed)
[place] LEVEL OF CARE SCREENING TOOL     IDENTIFICATION  Patient Name: CHIRSTINE DEFRAIN Birthdate: [birth year] Sex: female Admission Date (Current Location): 09/23/2017  [place] and [place] Number:  Engineering geologist and Address:  [facility], [address] [place], [place] [address]      Provider Number: [id number]  Attending Physician Name and Address:  Vaughan Basta, *  Relative Name and Phone Number:       Current Level of Care: Hospital Recommended Level of Care: [place] Prior Approval Number:    Date Approved/Denied:   PASRR Number: ([id number] expires on 10/16/2017 )  Discharge Plan: SNF    Current Diagnoses: Patient Active Problem List   Diagnosis Date Noted  . Goals of care, counseling/discussion   . Palliative care by specialist   . Pneumonia 09/23/2017  . Altered mental status 09/23/2017  . Acute respiratory failure with hypoxia ([place]) 09/23/2017  . Pressure injury of skin 09/23/2017    Orientation RESPIRATION BLADDER Height & Weight     Self  O2(2 Liters Oxygen. ) Incontinent Weight: 129 lb 6.6 oz (58.7 kg) Height:  5\' 5"  (165.1 cm)  BEHAVIORAL SYMPTOMS/MOOD NEUROLOGICAL BOWEL NUTRITION STATUS  (none)   Incontinent Diet(Diet: NPO to be advanced. )  AMBULATORY STATUS COMMUNICATION OF NEEDS Skin   Extensive Assist Verbally Normal                       Personal Care Assistance Level of Assistance  Bathing, Feeding, Dressing Bathing Assistance: Maximum assistance Feeding assistance: Maximum assistance Dressing Assistance: Maximum assistance Total Care Assistance: Maximum assistance   Functional Limitations Info  Sight, Hearing, Speech Sight Info: Adequate Hearing Info: Adequate Speech Info: Adequate    SPECIAL CARE FACTORS FREQUENCY  PT (By licensed PT), OT (By licensed OT)     PT Frequency: (5) OT Frequency: (5)            Contractures Contractures Info: Not  present    Additional Factors Info  Code Status, Allergies Code Status Info: (Full Code. ) Allergies Info: (Sulfa Antibiotics)           Current Medications (09/29/2017):  This is the current hospital active medication list Current Facility-Administered Medications  Medication Dose Route Frequency Provider Last Rate Last Dose  . acetaminophen (TYLENOL) tablet 650 mg  650 mg Oral Q6H PRN Fritzi Mandes, MD       Or  . acetaminophen (TYLENOL) suppository 650 mg  650 mg Rectal Q6H PRN Fritzi Mandes, MD      . diltiazem (CARDIZEM) tablet 30 mg  30 mg Oral Q6H [place] Dragon, MD   30 mg at 09/29/17 1214  . feeding supplement (OSMOLITE 1.5 CAL) liquid 1,000 mL  1,000 mL Per Tube Continuous Vaughan Basta, MD 45 mL/hr at 09/29/17 1512 1,000 mL at 09/29/17 1512  . feeding supplement (PRO-STAT SUGAR FREE 64) liquid 30 mL  30 mL Per Tube Daily Vaughan Basta, MD   30 mL at 09/29/17 0916  . free water 100 mL  100 mL Per Tube Q6H Vaughan Basta, MD   100 mL at 09/29/17 1218  . glipiZIDE (GLUCOTROL) tablet 5 mg  5 mg Oral QAC breakfast [place] Dragon, MD   5 mg at 09/29/17 0916  . insulin aspart (novoLOG) injection 0-15 Units  0-15 Units Subcutaneous Q4H [place] Dragon, MD   8 Units at 09/29/17 1214  . insulin glargine (LANTUS) injection 10 Units  10 Units Subcutaneous QHS Cammie Sickle A, MD      . ipratropium-albuterol (DUONEB) 0.5-2.5 (3) MG/3ML nebulizer solution 3 mL  3 mL Nebulization Q6H PRN Awilda Bill, NP   3 mL at 09/26/17 1713  . MEDLINE mouth rinse  15 mL Mouth Rinse BID Lafayette Dragon, MD   15 mL at 09/29/17 0916  . metoprolol tartrate (LOPRESSOR) tablet 50 mg  50 mg Per Tube BID Lafayette Dragon, MD   50 mg at 09/29/17 0915  . pantoprazole (PROTONIX) injection 40 mg  40 mg Intravenous Q12H Awilda Bill, NP   40 mg at 09/29/17 0915  . polyethylene glycol (MIRALAX / GLYCOLAX) packet 17 g  17 g Oral Daily PRN Lafayette Dragon, MD      . potassium  chloride (KLOR-CON) packet 20 mEq  20 mEq Oral Daily Vaughan Basta, MD   20 mEq at 09/29/17 0916  . potassium PHOSPHATE 15 mmol in dextrose 5 % 250 mL infusion  15 mmol Intravenous Once Lafayette Dragon, MD 43 mL/hr at 09/29/17 1426 15 mmol at 09/29/17 1426  . sennosides (SENOKOT) 8.8 MG/5ML syrup 5 mL  5 mL Per Tube BID PRN Awilda Bill, NP         Discharge Medications: Please see discharge summary for a list of discharge medications.  Relevant Imaging Results:  Relevant Lab Results:   Additional Information (SSN: [id number])  Jaxon Flatt, Veronia Beets, LCSW

## 2017-09-29 NOTE — Plan of Care (Signed)
Patients WOB shallow, tachypneic. MD ordered repeat CXR which showed worsening PNA. Patient remains weak, drowsy, unable to get OOB. Tube feeding decreased to 90mL continuous and patient made NPO as risk for aspiration increased. Code status to be addressed with family and patient per MD.   Problem: Health Behavior/Discharge Planning: Goal: Ability to manage health-related needs will improve Outcome: Not Progressing   Problem: Clinical Measurements: Goal: Respiratory complications will improve Outcome: Not Progressing   Problem: Activity: Goal: Risk for activity intolerance will decrease Outcome: Not Progressing   Problem: Nutrition: Goal: Adequate nutrition will be maintained Outcome: Not Progressing   Problem: Elimination: Goal: Will not experience complications related to bowel motility Outcome: Progressing Goal: Will not experience complications related to urinary retention Outcome: Progressing

## 2017-09-29 NOTE — Clinical Social Work Note (Signed)
Clinical Social Work Assessment  Patient Details  Name: Nina Hudson MRN: [id number] Date of Birth: 05-Jan-1946  Date of referral:  09/29/17               Reason for consult:  Other (Comment Required)(From [employer] )                Permission sought to share information with:  Chartered certified accountant granted to share information::  Yes, Verbal Permission Granted  Name::      Geophysicist/field seismologist::     Relationship::     Contact Information:     Housing/Transportation Living arrangements for the past 2 months:  [place], [place] of Information:  Other (Comment Required), Facility(Aunt ) Patient Interpreter Needed:  None Criminal Activity/Legal Involvement Pertinent to Current Situation/Hospitalization:  No - Comment as needed Significant Relationships:  Spouse Lives with:  Spouse Do you feel safe going back to the place where you live?    Need for family participation in patient care:  Yes (Comment)  Care giving concerns:  Patient is a readmission from [facility].    Social Worker assessment / plan:  Holiday representative (CSW) received consult that patient is from [employer]. Per [facility] admissions coordinator at [facility] patient came to [facility] from [facility] and readmitted to [facility]. Per Lovena Le patient was only at [facility] for a few days. Per Lovena Le patient can return to [facility] when medically stable. CSW attempted to contact patient's husband Calude several times today with no success. CSW met with patient and her aunt was at bedside. Patient was not able to participate in assessment. Per aunt patient's husband has not been to [facility] today because he had 2 appointments. Disposition is undetermined at this time. Per MD palliative consult is pending. CSW will continue to follow and assist as needed.   Employment status:  Disabled (Comment on whether or not currently receiving Disability) Insurance information:   Medicare PT Recommendations:  Not assessed at this time Information / Referral to community resources:  [place]  Patient/Family's Response to care:  Palliative consult is pending.   Patient/Family's Understanding of and Emotional Response to Diagnosis, Current Treatment, and Prognosis:  Patient's aunt was pleasant and thanked CSW for visit.   Emotional Assessment Appearance:  Appears stated age Attitude/Demeanor/Rapport:  Unable to Assess Affect (typically observed):  Unable to Assess Orientation:  Oriented to Self, Fluctuating Orientation (Suspected and/or reported Sundowners) Alcohol / Substance use:  Not Applicable Psych involvement (Current and /or in the community):  No (Comment)  Discharge Needs  Concerns to be addressed:  Discharge Planning Concerns Readmission within the last 30 days:  No Current discharge risk:  Dependent with Mobility, Cognitively Impaired, Chronically ill Barriers to Discharge:  Continued Medical Work up   [employer], Veronia Beets, LCSW 09/29/2017, 3:51 PM

## 2017-09-29 NOTE — Care Management Important Message (Signed)
Copy of signed IM left with patient in room.  

## 2017-09-29 NOTE — Progress Notes (Signed)
SLP Cancellation Note  Patient Details Name: Nina Hudson MRN: [id number] DOB: 09/25/1945   Cancelled treatment:       Reason Eval/Treat Not Completed: Medical issues which prohibited therapy;Patient not medically ready(chart reviewed; consulted MD, NSG, Palliative Care). Pt's respiratory status has declined; will hold on any oral intake at this time. Recommend frequent oral care for hygiene and oral stimulation of swallowing.  ST services will continue to f/u w/ pt's status for reassessment when pt is ready. MD agreed. Palliative Care is meeting w/ family today for goals of care meeting.    Orinda Kenner, MS, CCC-SLP Watson,Katherine 09/29/2017, 1:56 PM

## 2017-09-30 ENCOUNTER — Encounter
Admission: RE | Admit: 2017-09-30 | Discharge: 2017-09-30 | Disposition: A | Discharge status: Home / Self Care | Payer: Medicare Other | Source: Ambulatory Visit | Attending: Internal Medicine | Admitting: Internal Medicine

## 2017-09-30 ENCOUNTER — Inpatient Hospital Stay: Payer: Medicare Other

## 2017-09-30 LAB — GLUCOSE, CAPILLARY
GLUCOSE-CAPILLARY: 93 mg/dL (ref 70–99)
GLUCOSE-CAPILLARY: 181 mg/dL — AB (ref 70–99)
Glucose-Capillary: 138 mg/dL — ABNORMAL HIGH (ref 70–99)
Glucose-Capillary: 148 mg/dL — ABNORMAL HIGH (ref 70–99)
Glucose-Capillary: 170 mg/dL — ABNORMAL HIGH (ref 70–99)

## 2017-09-30 LAB — PHOSPHORUS: Phosphorus: 3 mg/dL (ref 2.5–4.6)

## 2017-09-30 LAB — CBC
HEMATOCRIT: 36.4 % (ref 35.0–47.0)
Hemoglobin: 11.8 g/dL — ABNORMAL LOW (ref 12.0–16.0)
MCH: 30.1 pg (ref 26.0–34.0)
MCHC: 32.4 g/dL (ref 32.0–36.0)
MCV: 92.9 fL (ref 80.0–100.0)
Platelets: 162 10*3/uL (ref 150–440)
RBC: 3.92 MIL/uL (ref 3.80–5.20)
RDW: 21.3 % — ABNORMAL HIGH (ref 11.5–14.5)
WBC: 6.3 10*3/uL (ref 3.6–11.0)

## 2017-09-30 LAB — PREALBUMIN: PREALBUMIN: 10.3 mg/dL — AB (ref 18–38)

## 2017-09-30 LAB — BASIC METABOLIC PANEL
ANION GAP: 7 (ref 5–15)
BUN: 26 mg/dL — ABNORMAL HIGH (ref 8–23)
CO2: 34 mmol/L — AB (ref 22–32)
CREATININE: 0.56 mg/dL (ref 0.44–1.00)
Calcium: 8.6 mg/dL — ABNORMAL LOW (ref 8.9–10.3)
Chloride: 98 mmol/L (ref 98–111)
GFR calc non Af Amer: >60 mL/min (ref >60)
GLUCOSE: 117 mg/dL — AB (ref 70–99)
Potassium: 4.8 mmol/L (ref 3.5–5.1)
Sodium: 139 mmol/L (ref 135–145)

## 2017-09-30 MED ORDER — METHYLPREDNISOLONE SODIUM SUCC 40 MG IJ SOLR
40.0000 mg | Freq: Every day | INTRAMUSCULAR | Status: DC
  Administered 2017-09-30 – 2017-10-05 (×6): 40 mg via INTRAVENOUS
  Filled 2017-09-30 (×6): qty 1

## 2017-09-30 MED ORDER — BUDESONIDE 0.5 MG/2ML IN SUSP: 0.5000 mg | Freq: Two times a day (BID) | RESPIRATORY_TRACT | Status: DC

## 2017-09-30 MED ORDER — BUDESONIDE 0.5 MG/2ML IN SUSP
0.5000 mg | Freq: Two times a day (BID) | RESPIRATORY_TRACT | Status: DC
  Administered 2017-09-30 – 2017-10-06 (×13): 0.5 mg via RESPIRATORY_TRACT
  Filled 2017-09-30 (×14): qty 2

## 2017-09-30 MED ORDER — LACTATED RINGERS IV BOLUS
500.0000 mL | Freq: Once | INTRAVENOUS | Status: AC
  Administered 2017-09-30: 500 mL via INTRAVENOUS

## 2017-09-30 MED ORDER — IPRATROPIUM-ALBUTEROL 0.5-2.5 (3) MG/3ML IN SOLN
3.0000 mL | Freq: Four times a day (QID) | RESPIRATORY_TRACT | Status: DC
  Administered 2017-09-30 – 2017-10-06 (×24): 3 mL via RESPIRATORY_TRACT
  Filled 2017-09-30 (×24): qty 3

## 2017-09-30 NOTE — Progress Notes (Signed)
Pt lung sounds considerably worse at this time. RT called, MD at bedside. Nebulizer given with lung sounds improving after administration. New orders for lactated ringers bolus, scheduled nebs and Pulmicort, and scheduled solumedrol.

## 2017-09-30 NOTE — Progress Notes (Signed)
PT Cancellation Note  Patient Details Name: Nina Hudson MRN: [id number] DOB: 31-Aug-1945   Cancelled Treatment:    Reason Eval/Treat Not Completed: Fatigue/lethargy limiting ability to participate; Spoke to nursing who requested PT hold this date secondary to continued pt lethargy and inability to participate with therapy.  Will attempt to see pt at a future date/time as medically appropriate.    Linus Salmons PT, DPT 09/30/17, 2:56 PM

## 2017-09-30 NOTE — Progress Notes (Signed)
Pharmacy Electrolyte Monitoring Consult:  Pharmacy consulted to assist in monitoring and replacing electrolytes in this 72 y.o. female admitted on 09/23/2017 with PNA  Labs:  Sodium (mmol/L)  Date Value  09/30/2017 139  04/11/2014 138   Potassium (mmol/L)  Date Value  09/30/2017 4.8  04/11/2014 3.9   Magnesium (mg/dL)  Date Value  09/29/2017 2.0  06/07/2012 1.8   Phosphorus (mg/dL)  Date Value  09/30/2017 3.0  01/16/2012 3.0   Calcium (mg/dL)  Date Value  09/30/2017 8.6 (L)   Calcium, Total (mg/dL)  Date Value  04/11/2014 8.1 (L)   Albumin (g/dL)  Date Value  09/25/2017 2.3 (L)  03/01/2013 3.4   Corrected Calcium: 9.2  Assessment/Plan: K and Phos at goal. Pt is not on any medications that remove K. Continue KCL 20 MEQ daily and recheck in 2 days  Sanai Frick D Tayonna Bacha, Pharm.D, BCPS Clinical Pharmacist

## 2017-09-30 NOTE — Progress Notes (Signed)
Daily Progress Note   Patient Name: Nina Hudson       Date: 09/30/2017 DOB: 1946-02-19  Age: 72 y.o. MRN#: [id number] Attending Physician: Vaughan Basta, * Primary Care Physician: Derinda Late, MD Admit Date: 09/23/2017  Reason for Consultation/Follow-up: Establishing goals of care and Psychosocial/spiritual support  Subjective: Less responsive today, shakes her head no to me but does not open eyes or speak. Increased work of breath and tachypnea continues. CXR ordered by attending - worsening, zosyn continued and tube feeds held.   Length of Stay: 7  Current Medications: Scheduled Meds:  . budesonide (PULMICORT) nebulizer solution  0.5 mg Nebulization BID  . diltiazem  30 mg Oral Q6H  . feeding supplement (PRO-STAT SUGAR FREE 64)  30 mL Per Tube Daily  . free water  100 mL Per Tube Q6H  . glipiZIDE  5 mg Oral QAC breakfast  . insulin aspart  0-15 Units Subcutaneous Q4H  . insulin glargine  10 Units Subcutaneous QHS  . ipratropium-albuterol  3 mL Nebulization Q6H  . mouth rinse  15 mL Mouth Rinse BID  . methylPREDNISolone (SOLU-MEDROL) injection  40 mg Intravenous Daily  . metoprolol tartrate  50 mg Per Tube BID  . pantoprazole (PROTONIX) IV  40 mg Intravenous Q12H  . potassium chloride  20 mEq Oral Daily    Continuous Infusions: . piperacillin-tazobactam (ZOSYN)  IV 3.375 g (09/30/17 0912)    PRN Meds: acetaminophen **OR** acetaminophen, ipratropium-albuterol, polyethylene glycol, sennosides  Physical Exam   Constitutional: She is lethargic. Responds to voice, answers yes/no questions.  HENT:  Head: Normocephalic and atraumatic.  Cardiovascular: An irregular rhythm present.  Pulmonary/Chest: Diminished breath sounds. Tachypneic. Shallow breathing.  Abdominal: Soft.  Bowel sounds are normal.  Musculoskeletal: She exhibits edema.  Neurological:  She is lethargic. Responds to voice, answers yes/no questions Skin: Skin is warm and dry.          Vital Signs: BP 111/82 (BP Location: Right Leg)   Pulse 71   Temp 97.6 F (36.4 C) (Oral)   Resp 18   Ht 5\' 5"  (1.651 m)   Wt 58.7 kg (129 lb 6.6 oz)   SpO2 100%   BMI 21.53 kg/m  SpO2: SpO2: 100 % O2 Device: O2 Device: Nasal Cannula O2 Flow Rate: O2 Flow Rate (L/min): 3 L/min  Intake/output summary:   Intake/Output Summary (Last 24 hours) at 09/30/2017 1058 Last data filed at 09/30/2017 0645 Gross per 24 hour  Intake -  Output 1100 ml  Net -1100 ml   LBM: Last BM Date: 09/29/17 Baseline Weight: Weight: 59 kg (130 lb) Most recent weight: Weight: 58.7 kg (129 lb 6.6 oz)       Palliative Assessment/Data: PPS 10%    Flowsheet Rows     Most Recent Value  Intake Tab  Referral Department  Critical care  Unit at Time of Referral  ICU  Palliative Care Primary Diagnosis  Sepsis/Infectious Disease  Date Notified  09/23/17  Palliative Care Type  New Palliative care  Reason for referral  Clarify Goals of Care  Date of Admission  09/23/17  Date first seen by Palliative Care  09/24/17  # of days Palliative referral response time  1  Day(s)  # of days IP prior to Palliative referral  0  Clinical Assessment  Palliative Performance Scale Score  30%  Psychosocial & Spiritual Assessment  Palliative Care Outcomes  Palliative Care Outcomes  Provided psychosocial or spiritual support, Counseled regarding hospice, ACP counseling assistance      Patient Active Problem List   Diagnosis Date Noted  . Goals of care, counseling/discussion   . Palliative care by specialist   . Pneumonia 09/23/2017  . Altered mental status 09/23/2017  . Acute respiratory failure with hypoxia ([place]) 09/23/2017  . Pressure injury of skin 09/23/2017    Palliative Care Assessment & Plan   HPI: 72 y.o. 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 Per NP note from facility, patient was found minimally responsive with tachypnea and tachycardia, mottled legs. CXR at facility revealed pna vs edema. Husband was called and he wanted full code and transfer to hospital. Patient was supposed to be seen by palliative care at the facility but the visit had not been completed yet. Patient was recently hospitalized at [facility] 2/5-8/52DPO complicated cholecystitis with cholecystectomy. She underwent an ERCP with stent placement and stone extraction with lap chole. She developed hypotension and bleeding postop requiring emergent transport back to the OR the same day, and wasfound to have hemoperitoneum due to clip falling off cystic artery resulting in cystic artery stump bleed requiring ligation. She was discharged from [facility] on 09/18/17 to the [facility] for rehab. Since discharge from [facility] she has been weak and fatigued with minimal PO intake. Upon arrival to [facility] pt required intubation d/t agonal respirations. Found to be guaiac positive and hgb 8.4 down from 10.2. Received 1 unit PRBCs. Lactic acid 7.5. CT abd from 6/20 revealed small amount of perihepatic and pelvic ascites and stable placement of patent metallic biliary stent. She was extubated 6/21. Chest xray worsening as of 6/25, worsening respiratory and mental status. PMT consulted for [place].   Assessment: Follow up with patient today - upon my assessment patient is lethargic, tachypneic, with increased work of breathing. Spoke with attending - he is aware and has continued antibiotics. PO intake stopped, tube feeds held.   Patient unable to participate in [place] conversation.   I made  multiple attempts to speak with patient's husband yesterday and he did not respond. This morning his caregiver answered the phone but informed me the husband was sleeping. I informed her I needed to meet with the patient's husband. She woke him up and he told her to tell me he would be here later to talk. After further discussion with caregiver she gave the phone to the patient's husband. I shared my concerns about the patient's worsening respiratory and mental status and need to discuss goals of care. He tells me he will be here this afternoon to talk but for now "do everything you can". Specifically addressed transfer to ICU and reintubation and he says if needed, he would want this.    I was able to speak to patient's son and daughter yesterday. They are both out of town. I shared with them my concerns about their mother's condition - described increased work of breathing. I shared with them the need to  address code status with patient's spouse. Per my previous discussions with patient's children, patient would not want to be intubated. They tell me their father is reluctant to meet with me because he is overwhelmed. He is also ill - recovering from surgery and sepsis.  Spoke with SLP regarding patient and continued tube feeding. Will keep for now d/t acute decompensation. Will make NPO. Need to address with spouse.   As far as functional and nutritional status, pt has had a steady decline over the past year. She had a stroke 1-2 years ago that impacted her functional status and since then she has been declining. Her children describe her as tired and depressed most of the time and feel she has "no will to live". After her hospitalization at [facility] this month she significantly declined. At rehab, she would not eat or get out of bed and seemed exhausted everyday. Her children feel that she is "ready to die".  I shared with them my concerns about her long term prognosis given her severe debility prior to this  hospitalization.   Questions and concerns were addressed. The family was encouraged to call with questions or concerns.   Patient's husband did not come to the hospital for scheduled meeting. Is not answering phone this afternoon and his voicemail is full.   Recommendations/Plan:  Need to have [place] meeting with patient's husband (he is not at hospital and will not respond to multiple calls), need to discuss code status - per children, her wishes would likely be DNR  Will follow up with family/patient to complete MOST form when appropriate  If discharged, will need outpatient palliative at least  PMT will follow  Code Status:  Full code  Prognosis:   Unable to determine poor long-term prognosis d/t functional declines and failure to thrive  Discharge Planning:  To Be Determined - likely SNF rehab with palliative if no acute decompensation  Care plan was discussed with RN, Dr. Anselm Jungling, patient's daughter, husband briefly over phone this AM  Thank you for allowing the Palliative Medicine Team to assist in the care of this patient.   Total Time 40 minutes  Prolonged Time Billed  no      Greater than 50%  of this time was spent counseling and coordinating care related to the above assessment and plan.  Juel Burrow, DNP, [facility] Palliative Medicine Team Team Phone # [phone]  Pager [phone]

## 2017-09-30 NOTE — Progress Notes (Signed)
Nutrition Follow-up  INTERVENTION:   RD to follow for goals of care discussion.  If TF restarted, recommend: - Goal regimen of Osmolite 1.5 Cal @ 45 ml/hr + 30 ml Pro-stat daily via NGT.   Provides 1720 kcal, 83 grams of protein, 821 mL H2O daily.  If diet advanced, recommend: - Magic cup TID with meals, each supplement provides 290 kcal and 9 grams of protein - Vital Cuisine (Hormel Shake) TID with meals, each provides 520 kcal and 22 grams protien  NUTRITION DIAGNOSIS:   Inadequate oral intake related to acute illness as evidenced by NPO status.  Ongoing  GOAL:   Patient will meet greater than or equal to 90% of their needs  Unmet at this time, pt is no longer on a diet and is no longer receiving TF  MONITOR:   Other (Comment), Skin, Weight trends, Diet advancement, I & O's, Labs(goals of care)  REASON FOR ASSESSMENT:   Consult Assessment of nutrition requirement/status, Poor PO  ASSESSMENT:   72 yo female with a PMH of Stroke, HTN, Rheumatoid Arthritis,  Hypercholesteremia, Dysrhythmia, Diabetes Mellitus, Uterine Cancer, Diffuse Large B Cell Lymphoma, Atrial Fibrillation (previously on eliquis held due to recent surgery), Chronic Diastolic CHF (EF 55 to 94% on 09/08/17) , Depression, and Arthritis.  She presented to [facility] ER on 06/19 via EMS from the [facility] after being found minimally responsive with rapid respirations and increased heart rate. She recently had a complicated hospitalization at [facility] from 17/07/812-48/18/5631 for complicated cholecystitis with cholecystectomy. She underwent an ERCP with metal stent placement and stone extraction with lap chole.  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 She was discharged from [facility] on 09/18/17 to the [facility] for  rehab.   6/21 - OGT was removed and Dobbhoff tube was placed in right nare, pt later extubated 6/24 - SLP evaluation with recommendations for dysphagia 2 diet with nectar-thick liquids 6/25 - pt made NPO, TF rate decreased then d/c due to concerns for aspiration (R lower lobe pneumonia worsening on x-ray)  Dobbhoff tube remains in place in R nare with 65 cm marking at nare.  Discussed pt with RN. Noted in chart that pt with considerably worse lung sounds this morning and increased lethargy. Noted palliative care consult pending.  Noted SLP recommendation for NPO as pt's respiratory status is not appropriate for PO trials.  Medications reviewed and include: 30 ml Pro-stat daily (not currently being given), 5 mg Glucotrol daily, sliding scale Novolog q 4 hours, 10 units Lantus daily, 40 mg IV Protonix BID, 20 mEq KCl daily, IV antibiotics  Labs reviewed: CO2 34 (H), BUN 26 (H), hemoglobin 11.8 (L) CBG's: 148, 93, 138, 86, 124, 235 x 24 hours  Diet Order:   Diet Order           Diet NPO time specified  Diet effective now          EDUCATION NEEDS:   Not appropriate for education at this time  Skin:  Skin Assessment: Skin Integrity Issues:(closed incision to abdomen; scattered ecchymosis; left arm weeping)  Last BM:  09/29/17  Height:   Ht Readings from Last 1 Encounters:  09/23/17 5\' 5"  (1.651 m)    Weight:   Wt Readings from Last 1 Encounters:  09/23/17 129 lb 6.6 oz (58.7 kg)  Ideal Body Weight:  56.8 kg  BMI:  Body mass index is 21.53 kg/m.  Estimated Nutritional Needs:   Kcal:  1465-1760 (25-30 kcal/kg)  Protein:  75-90 grams (1.3-1.5 grams/kg)  Fluid:  1.5 L/day (25 mL/kg)    Gaynell Face, MS, RD, LDN Pager: [phone] Weekend/After Hours: [phone]

## 2017-09-30 NOTE — Progress Notes (Signed)
[place] [facility] [place] NAME: Nina Hudson    MR#:  [id number]  DATE OF BIRTH:  19-Apr-1945  SUBJECTIVE:  patient extubated. He appears week. Continue tube feeding secondary to poor PO intake. History of trouble swallowing speech eval done. Patient extremity deconditioned. Malnourished.  have some rapid shallow breaths . Sats stable.  REVIEW OF SYSTEMS:   Review of Systems  Constitutional: Negative for chills, fever and weight loss.  HENT: Negative for ear discharge, ear pain and nosebleeds.   Eyes: Negative for blurred vision, pain and discharge.  Respiratory: Negative for sputum production, shortness of breath, wheezing and stridor.   Cardiovascular: Negative for chest pain, palpitations, orthopnea and PND.  Gastrointestinal: Negative for abdominal pain, diarrhea, nausea and vomiting.  Genitourinary: Negative for frequency and urgency.  Musculoskeletal: Negative for back pain and joint pain.  Neurological: Positive for weakness. Negative for sensory change, speech change and focal weakness.  Psychiatric/Behavioral: Negative for depression and hallucinations. The patient is not nervous/anxious.    Tolerating Diet:ng feeding Tolerating PT: pending  DRUG ALLERGIES:   Allergies  Allergen Reactions  . Sulfa Antibiotics Other (See Comments)    "I get real achey" Other reaction(s): Muscle Pain, Other (See Comments)  General body aches andpain.     VITALS:  Blood pressure 111/82, pulse 71, temperature 97.6 F (36.4 C), temperature source Oral, resp. rate 18, height 5\' 5"  (1.651 m), weight 64.5 kg (142 lb 1.6 oz), SpO2 100 %.  PHYSICAL EXAMINATION:   Physical Exam  GENERAL:  72 y.o.-year-old patient lying in the bed with no acute distress. Very deconditioned, anasarca EYES: Pupils equal, round, reactive to light and accommodation. No scleral icterus. Extraocular muscles intact.  HEENT: Head atraumatic, normocephalic. Oropharynx  and nasopharynx clear. Ng+ NECK:  Supple, no jugular venous distention. No thyroid enlargement, no tenderness.  LUNGS: decreased sound on right side, no wheezing, rales, rhonchi. positive use of accessory muscles of respiration.  CARDIOVASCULAR: S1, S2 normal. No murmurs, rubs, or gallops.  ABDOMEN: Soft, nontender, nondistended. Bowel sounds present. No organomegaly or mass. Foley in place, dark red to brown urine in the container,. EXTREMITIES: No cyanosis, clubbing Upper extremity edema.    NEUROLOGIC: Cranial nerves II through XII are intact. No focal Motor or sensory deficits b/l.very weak   PSYCHIATRIC:  patient is alert and oriented x 2.  SKIN: No obvious rash, lesion, or ulcer. Bruises+  LABORATORY PANEL:  CBC Recent Labs  Lab 09/30/17 0532  WBC 6.3  HGB 11.8*  HCT 36.4  PLT 162    Chemistries  Recent Labs  Lab 09/25/17 0513  09/29/17 0445 09/30/17 0532  NA 140   < > 138 139  K 3.7   < > 3.7 4.8  CL 97*   < > 95* 98  CO2 33*   < > 36* 34*  GLUCOSE 199*   < > 266* 117*  BUN 17   < > 24* 26*  CREATININE 0.65   < > 0.55 0.56  CALCIUM 7.6*   < > 8.4* 8.6*  MG  --    < > 2.0  --   AST 32  --   --   --   ALT 18  --   --   --   ALKPHOS 111  --   --   --   BILITOT 1.7*  --   --   --    < > = values in this interval not displayed.  Cardiac Enzymes Recent Labs  Lab 09/23/17 2128  TROPONINI 0.04*   RADIOLOGY:  Dg Chest 1 View  Result Date: 09/30/2017 CLINICAL DATA:  Hypoxia EXAM: CHEST  1 VIEW COMPARISON:  09/29/2017 FINDINGS: Cardiac shadow is stable. Aortic calcifications are again seen. Feeding catheter is noted coiled within the stomach stable from the prior exam. The lungs are well aerated bilaterally. Mild interstitial changes are seen with persistent right basilar atelectasis and small effusion. No bony abnormality is noted. IMPRESSION: Stable appearance of the chest from the previous day. Electronically Signed   By: Nina Hudson M.D.   On: 09/30/2017 08:20    Dg Chest 1 View  Result Date: 09/29/2017 CLINICAL DATA:  Hypoxia, shortness of breath, and weakness. Limited ability to give history. History of lymphoma, uterine malignancy, diabetes, and previous CVA. Former smoker. EXAM: CHEST  1 VIEW COMPARISON:  Portable chest x-ray of September 25, 2017 FINDINGS: The lungs are well-expanded. There is increased density at the right lung base with partial obscuration of the hemidiaphragm. A small right pleural effusion is suspected. The left lung exhibits no infiltrate or effusion. The interstitial markings of both lungs are coarse. The heart and pulmonary vascularity are normal. A feeding tube is present with the radiodense tube tip projecting in the region of the gastric cardia with some coiling of the tube more inferiorly in the gastric body. IMPRESSION: Worsening atelectasis or pneumonia at the right lung base with small right pleural effusion. Underlying COPD. No CHF. Electronically Signed   By: Nina  Hudson M.D.   On: 09/29/2017 14:22   ASSESSMENT AND PLAN:   Nina Hudson a72 y.o.femalewith a known history of diffuse large B cell lymphoma, uterine cancer, history of a fib on eliquis, diabetes, comes to the emergency room from [facility] where she was admitted on 614 2019. Patient is being admitted type for acute on chronic hypoxic respiratory failure secondary to suspected pneumonia/G.I. bleed/acute on chronic anemia.  1.acute on chronic hypoxic respiratory failure secondary to right lower lobe pneumonia -PE has been ruled out - pneumonia positive tracheal aspirate Gram stain --growing pseudomonas - Continue IV antibiotic with Zosyn--consider po abx once sensitivities are back - as worsening on Xray chest- will cont zosyn and stop NG and oral feeds. - Tried to talk to husband regarding palliative care. He did not reply the phone call. - I called and spoke to her son today, explained about poor prognosis and encourage to make further decisions.  -  husband is suppose to meet palliative care today.  2.  Acute encephalopathy EEG shows slowing with sleep state Likely due to pneumonia  3. acute on chronic anemia -patient on eliquis -holding it at present -Status post 1 unit of transfusion -Hemoglobin stable  3.History of ERCP with stone removal and stent placement along with lap Coley complicated by cystic artery bleed requiring exploratory laparotomy -this was done at [facility] on 6/5 /2019 -surgicalconsultation if needed  4.Diabetes sliding scale insulin for now  5.  A. fib with RVR - stable with oral metoprolol and cardizem.  6.DVT prophylaxis SCD  and Ted's and eliquis   Hold eliquis as there is hematuria.  7. Poor PO intake with malnutrition - currently getting NG tube feeding. - Therapy to evaluate swallow and start oral diet per recommendation thereafter - Cont tube feeds to provide enough nutrition. - Worsening pneumonia, may be more aspirations- so hold feeding now. - we may need PEG placement, if family want to continue aggressive management,  Case discussed with Care  Management/Social Worker. Management plans discussed with the patient, family and they are in agreement.  CODE STATUS: full  DVT Prophylaxis: eliquis  TOTAL TIME TAKING CARE OF THIS PATIENT: *40* minutes.  >50% time spent on counselling and coordination of care Seen pt twice, and tried calling her husband to discuss about her condiition and code status.  POSSIBLE D/C IN few DAYS, DEPENDING ON CLINICAL CONDITION.  Note: This dictation was prepared with Dragon dictation along with smaller phrase technology. Any transcriptional errors that result from this process are unintentional.  Vaughan Basta M.D on 09/30/2017 at 4:33 PM  Between 7am to 6pm - Pager - [phone]  After 6pm go to [email or web address] - password [email or web address] [facility]  Office  [phone]  CC: Primary care physician; Derinda Late, MDPatient  ID: Nina Hudson, female   DOB: Apr 21, 1945, 72 y.o.   MRN: [id number]

## 2017-09-30 NOTE — Progress Notes (Signed)
SLP Cancellation Note  Patient Details Name: OTELIA HETTINGER MRN: [id number] DOB: 05/25/1945   Cancelled treatment:       Reason Eval/Treat Not Completed: Medical issues which prohibited therapy;Patient not medically ready(chart reviewed; consulted NSG re: pt's status today). During the early morning, pt's lung sounds considerably worse; RT called and gave tx. Palliative Care if following for goals of care w/ family. Informed NSG that pt's respiratory status is not appropriate for po trials at this time. ST services will continue to f/u and reassess when pt's medical/respiratory status' are stable. NSG agreed.     Orinda Kenner, MS, CCC-SLP Selene Peltzer 09/30/2017, 12:29 PM

## 2017-10-01 LAB — GLUCOSE, CAPILLARY
GLUCOSE-CAPILLARY: 123 mg/dL — AB (ref 70–99)
GLUCOSE-CAPILLARY: 154 mg/dL — AB (ref 70–99)
Glucose-Capillary: 141 mg/dL — ABNORMAL HIGH (ref 70–99)
Glucose-Capillary: 171 mg/dL — ABNORMAL HIGH (ref 70–99)
Glucose-Capillary: 216 mg/dL — ABNORMAL HIGH (ref 70–99)
Glucose-Capillary: 81 mg/dL (ref 70–99)

## 2017-10-01 MED ORDER — DILTIAZEM HCL 30 MG PO TABS
30.0000 mg | ORAL_TABLET | Freq: Four times a day (QID) | ORAL | Status: DC
  Administered 2017-10-01 – 2017-10-02 (×4): 30 mg via ORAL
  Filled 2017-10-01 (×4): qty 1

## 2017-10-01 MED ORDER — PRO-STAT SUGAR FREE PO LIQD
60.0000 mL | Freq: Two times a day (BID) | ORAL | Status: DC
  Administered 2017-10-02 (×2): 60 mL

## 2017-10-01 MED ORDER — ADULT MULTIVITAMIN LIQUID CH
15.0000 mL | Freq: Every day | ORAL | Status: DC
  Administered 2017-10-01 – 2017-10-02 (×2): 15 mL
  Filled 2017-10-01 (×2): qty 15

## 2017-10-01 MED ORDER — DILTIAZEM HCL ER COATED BEADS 120 MG PO CP24: 120.0000 mg | ORAL_CAPSULE | Freq: Every day | ORAL | Status: DC

## 2017-10-01 MED ORDER — FREE WATER
30.0000 mL | Status: DC
  Administered 2017-10-01 – 2017-10-02 (×5): 30 mL

## 2017-10-01 MED ORDER — OSMOLITE 1.5 CAL PO LIQD
1000.0000 mL | ORAL | Status: DC
  Administered 2017-10-01: 1000 mL

## 2017-10-01 MED ORDER — SODIUM CHLORIDE 0.9 % IV BOLUS
500.0000 mL | Freq: Once | INTRAVENOUS | Status: AC
  Administered 2017-10-01: 500 mL via INTRAVENOUS

## 2017-10-01 NOTE — Progress Notes (Signed)
PT Cancellation Note  Patient Details Name: Nina Hudson MRN: [id number] DOB: [birth year]   Cancelled Treatment:    Reason Eval/Treat Not Completed: Patient declined, no reason specified   Pt more alert this am but slow to respond.  Pt offered and encouraged therapy session this am but she declined stating "I don't think so"  Educated on importance of mobility and offered bedside exercises "Not today"  Will attempt again later today as time allows.   Chesley Noon 10/01/2017, 9:29 AM

## 2017-10-01 NOTE — Plan of Care (Signed)
Patient has rested quietly today, sleeping mostly. Arouses easily to voice. Answers most questions appropriately. Denies pain. Family at bedside.

## 2017-10-01 NOTE — Progress Notes (Signed)
OT Cancellation Note  Patient Details Name: Nina Hudson MRN: [id number] DOB: [birth year]   Cancelled Treatment:    Reason Eval/Treat Not Completed: Other (comment). Pt with nursing upon attempt. Of note, refused PT this am. Will re-attempt OT tx at later date/time as pt is available, medically appropriate, and as schedule permits.   Jeni Salles, MPH, MS, OTR/L ascom [phone] 10/01/17, 11:58 AM

## 2017-10-01 NOTE — Plan of Care (Signed)
  Problem: Clinical Measurements: Goal: Will remain free from infection Outcome: Progressing   Problem: Pain Managment: Goal: General experience of comfort will improve Outcome: Progressing   Problem: Safety: Goal: Ability to remain free from injury will improve Outcome: Progressing   Problem: Skin Integrity: Goal: Risk for impaired skin integrity will decrease Outcome: Progressing   

## 2017-10-01 NOTE — Progress Notes (Signed)
Nutrition Follow-up  INTERVENTION:   Spoke to MD- will plan to initiate Osmolite 1.5 at trickle rate of 48ml/hr   Add 60 ml Pro-stat BID via NGT.   Free water 60ml q 4 hrs  Provides 760 kcal, 83 grams of protein, 455 mL H2O daily.  When able to advance tube feeds recommend Osmolite 1.5 @ goal rate of 45 ml/hr + Prostat 21ml daily via tube  Regimen will provide 1720kcal/day, 83g/day protein, 823 ml/day free water   MVI liquid daily via tube   NUTRITION DIAGNOSIS:   Inadequate oral intake related to acute illness as evidenced by NPO status.  Ongoing  GOAL:   Patient will meet greater than or equal to 90% of their needs -progressing with TFs  MONITOR:   Other (Comment), Skin, Weight trends, Diet advancement, I & O's, Labs(goals of care)  REASON FOR ASSESSMENT:   Consult Assessment of nutrition requirement/status, Poor PO  ASSESSMENT:   72 yo female with a PMH of Stroke, HTN, Rheumatoid Arthritis,  Hypercholesteremia, Dysrhythmia, Diabetes Mellitus, Uterine Cancer, Diffuse Large B Cell Lymphoma, Atrial Fibrillation (previously on eliquis held due to recent surgery), Chronic Diastolic CHF (EF 55 to 50% on 09/08/17) , Depression, and Arthritis.  She presented to [facility] ER on 06/19 via EMS from the [place] after being found minimally responsive with rapid respirations and increased heart rate. She recently had a complicated hospitalization at [facility] from [id number]/05/6710-45/80/9983 for complicated cholecystitis with cholecystectomy. She underwent an ERCP with metal stent placement and stone extraction with lap chole.  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 She was discharged from [facility] on 09/18/17 to the [facility] for rehab.   6/21 - OGT was removed and Dobbhoff tube was placed  in right nare, pt later extubated 6/24 - SLP evaluation with recommendations for dysphagia 2 diet with nectar-thick liquids 6/25 - pt made NPO, TF rate decreased then d/c due to concerns for aspiration (R lower lobe pneumonia worsening on x-ray)  Dobbhoff tube remains in place in R nare with 65 cm marking at nare.  Discussed pt with MD. Will plan to initiate tube feeds at a trickle rate and see if pt tolerates. If pt continues to aspirate and family chooses to pursue agressive care, pt will likely need G-J tube placement for post pyloric feeds to reduce her risk of aspiration. NGT can be advanced post pyloric by IR to see if pt will be able to tolerate post pyloric feeds. Per chart, pt with wt gain since admit; pt noted to have edema. RD will monitor for tube feed tolerance and GOC.   Medications reviewed and include: 30 ml Pro-stat daily (not currently being given), 5 mg Glucotrol daily, sliding scale Novolog q 4 hours, 10 units Lantus daily, 40 mg IV Protonix BID, 20 mEq KCl daily, IV antibiotics  Labs reviewed:   Diet Order:   Diet Order           Diet NPO time specified  Diet effective now         EDUCATION NEEDS:   Not appropriate for education at this time  Skin:  Skin Assessment: Skin Integrity Issues:(closed incision to abdomen; scattered ecchymosis; left arm weeping)  Last BM:  09/29/17  Height:   Ht Readings from Last 1 Encounters:  09/23/17 5\' 5"  (1.651 m)  Weight:   Wt Readings from Last 1 Encounters:  10/01/17 139 lb 3.2 oz (63.1 kg)    Ideal Body Weight:  56.8 kg  BMI:  Body mass index is 23.16 kg/m.  Estimated Nutritional Needs:   Kcal:  1465-1760 (25-30 kcal/kg)  Protein:  75-90 grams (1.3-1.5 grams/kg)  Fluid:  1.5 L/day (25 mL/kg)  Koleen Distance MS, RD, LDN Pager #- [phone] Office#- [phone] After Hours Pager: [phone]

## 2017-10-01 NOTE — Progress Notes (Signed)
[place] at [place] NAME: Nina Hudson    MR#:  [id number]  DATE OF BIRTH:  [birth year]  SUBJECTIVE:  patient extubated. He appears week. Continue tube feeding secondary to poor PO intake. History of trouble swallowing speech eval done. Patient extremity deconditioned. Malnourished. Breathing calm today. Sats stable.  REVIEW OF SYSTEMS:   Review of Systems  Constitutional: Negative for chills, fever and weight loss.  HENT: Negative for ear discharge, ear pain and nosebleeds.   Eyes: Negative for blurred vision, pain and discharge.  Respiratory: Negative for sputum production, shortness of breath, wheezing and stridor.   Cardiovascular: Negative for chest pain, palpitations, orthopnea and PND.  Gastrointestinal: Negative for abdominal pain, diarrhea, nausea and vomiting.  Genitourinary: Negative for frequency and urgency.  Musculoskeletal: Negative for back pain and joint pain.  Neurological: Positive for weakness. Negative for sensory change, speech change and focal weakness.  Psychiatric/Behavioral: Negative for depression and hallucinations. The patient is not nervous/anxious.    Tolerating Diet:ng feeding Tolerating PT: pending  DRUG ALLERGIES:   Allergies  Allergen Reactions  . Sulfa Antibiotics Other (See Comments)    "I get real achey" Other reaction(s): Muscle Pain, Other (See Comments)  General body aches andpain.     VITALS:  Blood pressure 106/64, pulse 79, temperature 97.6 F (36.4 C), temperature source Oral, resp. rate 18, height 5\' 5"  (1.651 m), weight 63.1 kg (139 lb 3.2 oz), SpO2 99 %.  PHYSICAL EXAMINATION:   Physical Exam  GENERAL:  72 y.o.-year-old patient lying in the bed with no acute distress. Very deconditioned, anasarca EYES: Pupils equal, round, reactive to light and accommodation. No scleral icterus. Extraocular muscles intact.  HEENT: Head atraumatic, normocephalic. Oropharynx and  nasopharynx clear. Ng+ NECK:  Supple, no jugular venous distention. No thyroid enlargement, no tenderness.  LUNGS: decreased sound on right side, no wheezing, rales, rhonchi. positive use of accessory muscles of respiration.  CARDIOVASCULAR: S1, S2 normal. No murmurs, rubs, or gallops.  ABDOMEN: Soft, nontender, nondistended. Bowel sounds present. No organomegaly or mass. Foley in place, dark red to brown urine in the container,. EXTREMITIES: No cyanosis, clubbing Upper extremity edema.    NEUROLOGIC: Cranial nerves II through XII are intact. No focal Motor or sensory deficits b/l.very weak   PSYCHIATRIC:  patient is alert and oriented x 2.  SKIN: No obvious rash, lesion, or ulcer. Bruises+  LABORATORY PANEL:  CBC Recent Labs  Lab 09/30/17 0532  WBC 6.3  HGB 11.8*  HCT 36.4  PLT 162    Chemistries  Recent Labs  Lab 09/25/17 0513  09/29/17 0445 09/30/17 0532  NA 140   < > 138 139  K 3.7   < > 3.7 4.8  CL 97*   < > 95* 98  CO2 33*   < > 36* 34*  GLUCOSE 199*   < > 266* 117*  BUN 17   < > 24* 26*  CREATININE 0.65   < > 0.55 0.56  CALCIUM 7.6*   < > 8.4* 8.6*  MG  --    < > 2.0  --   AST 32  --   --   --   ALT 18  --   --   --   ALKPHOS 111  --   --   --   BILITOT 1.7*  --   --   --    < > = values in this interval not displayed.   Cardiac Enzymes No  results for input(s): TROPONINI in the last 168 hours. RADIOLOGY:  Dg Chest 1 View  Result Date: 09/30/2017 CLINICAL DATA:  Hypoxia EXAM: CHEST  1 VIEW COMPARISON:  09/29/2017 FINDINGS: Cardiac shadow is stable. Aortic calcifications are again seen. Feeding catheter is noted coiled within the stomach stable from the prior exam. The lungs are well aerated bilaterally. Mild interstitial changes are seen with persistent right basilar atelectasis and small effusion. No bony abnormality is noted. IMPRESSION: Stable appearance of the chest from the previous day. Electronically Signed   By: Inez Catalina M.D.   On: 09/30/2017 08:20    ASSESSMENT AND PLAN:   Nina Hudson a72 y.o.femalewith a known history of diffuse large B cell lymphoma, uterine cancer, history of a fib on eliquis, diabetes, comes to the emergency room from [facility] where she was admitted on 73 2019. Patient is being admitted type for acute on chronic hypoxic respiratory failure secondary to suspected pneumonia/G.I. bleed/acute on chronic anemia.  1.acute on chronic hypoxic respiratory failure secondary to right lower lobe pneumonia -PE has been ruled out - pneumonia positive tracheal aspirate Gram stain --growing pseudomonas - Continue IV antibiotic with Zosyn--consider po abx once sensitivities are back - as worsening on Xray chest- will cont zosyn and stop NG and oral feeds. - Tried to talk to husband regarding palliative care. He did not reply the phone call. - I spoke to her son and husband today, explained about poor prognosis and encourage to make further decisions.  - discussed about options of comfort care vs continued care and feeding tube options.  2.  Acute encephalopathy EEG shows slowing with sleep state Likely due to pneumonia  3. acute on chronic anemia -patient on eliquis -holding it at present -Status post 1 unit of transfusion -Hemoglobin stable  3.History of ERCP with stone removal and stent placement along with lap Coley complicated by cystic artery bleed requiring exploratory laparotomy -this was done at [facility] on 6/5 /2019  4.Diabetes sliding scale insulin for now  5.  A. fib with RVR - stable with oral metoprolol and cardizem.  6.DVT prophylaxis SCD  and Ted's and eliquis   Hold eliquis as there is hematuria.  7. Poor PO intake with malnutrition - currently getting NG tube feeding. - Therapy to evaluate swallow and start oral diet per recommendation thereafter - Cont tube feeds to provide enough nutrition. - Worsening pneumonia, may be more aspirations- so hold feeding now. - we may need PEG  placement, if family want to continue aggressive management,  Case discussed with Care Management/Social Worker. Management plans discussed with the patient, family and they are in agreement.  CODE STATUS: full  DVT Prophylaxis: eliquis  TOTAL TIME TAKING CARE OF THIS PATIENT: *40* minutes.  >50% time spent on counselling and coordination of care Seen pt twice, and tried calling her husband to discuss about her condiition and code status.  POSSIBLE D/C IN few DAYS, DEPENDING ON CLINICAL CONDITION.  Note: This dictation was prepared with Dragon dictation along with smaller phrase technology. Any transcriptional errors that result from this process are unintentional.  Vaughan Basta M.D on 10/01/2017 at 3:21 PM  Between 7am to 6pm - Pager - [phone]  After 6pm go to [email or web address] - password [email or web address] [facility]  Office  [phone]  CC: Primary care physician; Derinda Late, MDPatient ID: Nina Hudson, female   DOB: 04-05-1946, 72 y.o.   MRN: [id number]

## 2017-10-01 NOTE — Progress Notes (Addendum)
Tube feeding restarted.  Dobhoff flushed easily.  HOB locked at 30 degrees or greater.

## 2017-10-01 NOTE — Progress Notes (Addendum)
Dr. Anselm Jungling aware that BP has stayed low. Instructed to hold metoprolol but give cardizem according to schedule for rate control.

## 2017-10-01 NOTE — Progress Notes (Signed)
Daily Progress Note   Patient Name: Nina Hudson       Date: 10/01/2017 DOB: 1946/03/23  Age: 72 y.o. MRN#: [id number] Attending Physician: Vaughan Basta, * Primary Care Physician: Derinda Late, MD Admit Date: 09/23/2017  Reason for Consultation/Follow-up: Establishing goals of care and Psychosocial/spiritual support  Subjective: Lethargic - opens eyes to voice, one word answers to questions. No PO intake. Tube feeds held d/t concern for aspiration.   Length of Stay: 8  Current Medications: Scheduled Meds:  . budesonide (PULMICORT) nebulizer solution  0.5 mg Nebulization BID  . diltiazem  30 mg Oral Q6H  . feeding supplement (PRO-STAT SUGAR FREE 64)  30 mL Per Tube Daily  . glipiZIDE  5 mg Oral QAC breakfast  . insulin aspart  0-15 Units Subcutaneous Q4H  . insulin glargine  10 Units Subcutaneous QHS  . ipratropium-albuterol  3 mL Nebulization Q6H  . mouth rinse  15 mL Mouth Rinse BID  . methylPREDNISolone (SOLU-MEDROL) injection  40 mg Intravenous Daily  . metoprolol tartrate  50 mg Per Tube BID  . pantoprazole (PROTONIX) IV  40 mg Intravenous Q12H  . potassium chloride  20 mEq Oral Daily    Continuous Infusions: . piperacillin-tazobactam (ZOSYN)  IV Stopped (10/01/17 0612)    PRN Meds: acetaminophen **OR** acetaminophen, ipratropium-albuterol, polyethylene glycol, sennosides  Physical Exam   Constitutional: She is lethargic. Responds to voice, answers yes/no questions.  HENT:  Head: Normocephalic and atraumatic.  Cardiovascular: An irregular rhythm present.  Pulmonary/Chest: Diminished breath sounds.  Abdominal: Soft. Bowel sounds are normal.  Musculoskeletal: She exhibits edema.  Neurological:  She is lethargic. Responds to voice, answers yes/no questions Skin: Skin  is warm and dry.          Vital Signs: BP (!) 100/59 (BP Location: Left Arm)   Pulse 70   Temp 97.8 F (36.6 C) (Oral)   Resp 18   Ht 5' 5"  (1.651 m)   Wt 63.1 kg (139 lb 3.2 oz)   SpO2 100%   BMI 23.16 kg/m  SpO2: SpO2: 100 % O2 Device: O2 Device: Nasal Cannula O2 Flow Rate: O2 Flow Rate (L/min): 4 L/min  Intake/output summary:   Intake/Output Summary (Last 24 hours) at 10/01/2017 5852 Last data filed at 10/01/2017 0600 Gross per 24 hour  Intake 50 ml  Output 200 ml  Net -150 ml   LBM: Last BM Date: 09/29/17 Baseline Weight: Weight: 59 kg (130 lb) Most recent weight: Weight: 63.1 kg (139 lb 3.2 oz)       Palliative Assessment/Data: PPS 10%    Flowsheet Rows     Most Recent Value  Intake Tab  Referral Department  Critical care  Unit at Time of Referral  ICU  Palliative Care Primary Diagnosis  Sepsis/Infectious Disease  Date Notified  09/23/17  Palliative Care Type  New Palliative care  Reason for referral  Clarify Goals of Care  Date of Admission  09/23/17  Date first seen by Palliative Care  09/24/17  # of days Palliative referral response time  1 Day(s)  # of days IP prior to Palliative referral  0  Clinical Assessment  Palliative Performance Scale Score  30%  Psychosocial & Spiritual  Assessment  Palliative Care Outcomes  Palliative Care Outcomes  Provided psychosocial or spiritual support, Counseled regarding hospice, ACP counseling assistance      Patient Active Problem List   Diagnosis Date Noted  . Goals of care, counseling/discussion   . Palliative care by specialist   . Pneumonia 09/23/2017  . Altered mental status 09/23/2017  . Acute respiratory failure with hypoxia ([place]) 09/23/2017  . Pressure injury of skin 09/23/2017    Palliative Care Assessment & Plan   HPI: 72 y.o. 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 Per NP note from facility, patient was found minimally responsive with tachypnea and tachycardia, mottled legs. CXR at facility revealed pna vs edema. Husband was called and he wanted full code and transfer to hospital. Patient was supposed to be seen by palliative care at the facility but the visit had not been completed yet. Patient was recently hospitalized at [facility] 1/6-0/73XTG complicated cholecystitis with cholecystectomy. She underwent an ERCP with stent placement and stone extraction with lap chole. She developed hypotension and bleeding postop requiring emergent transport back to the OR the same day, and wasfound to have hemoperitoneum due to clip falling off cystic artery resulting in cystic artery stump bleed requiring ligation. She was discharged from [facility] on 09/18/17 to [facility] for [facility]. Since discharge from [facility] she has been weak and fatigued with minimal PO intake. Upon arrival to [facility] pt required intubation d/t agonal respirations. Found to be guaiac positive and hgb 8.4 down from 10.2. Received 1 unit PRBCs. Lactic acid 7.5. CT abd from 6/20 revealed small amount of perihepatic and pelvic ascites and stable placement of patent metallic biliary stent. She was extubated 6/21. Chest xray worsening as of 6/25, worsening respiratory and mental status. No PO intake. Tube feeding stopped d/t concern for aspiration. PMT consulted for [place].   Assessment: Follow up with patient today - upon my assessment patient is lethargic. Concern for pna and aspiration - PO intake stopped, tube feeds held.   Patient unable to participate in [place] conversation.   I met at the bedside along with patient's spouse and 2 children to discuss diagnosis prognosis, GOC, EOL wishes, disposition and options.  We discussed a brief life review of  the patient. She enjoys being active and gardening. They tell me she was never idle.  As far as functional and nutritional status, pt has had a steady decline over the past year. She had a stroke 1-2 years ago that impacted her functional status and since then she has been declining. Her children describe her as tired and depressed most of the time and feel she has "no will to live". After her hospitalization at [facility] this month she significantly declined. At rehab, she would not eat or get out of bed and seemed exhausted everyday. Her children feel that she is "ready to die".  I shared with them my concerns about her long term prognosis given her severe debility prior to this hospitalization. Husband feels like she was doing well but does admit that she did not "bounce back" after last hospitalization.    We discussed her current illness and what it means in the larger context of her on-going  co-morbidities.  We discussed concerns about maintaining nutrition and we discussed risks for pneumonia. They ask about options and we discussed careful hand feeding vs PEG tube. Thoroughly discussed risks and benefits of each.   I attempted to elicit values and goals of care important to the patient.    The difference between aggressive medical intervention and comfort care was considered in light of the patient's goals of care. Family is unsure how to proceed. They share how they have been on an emotional roller coaster d/t to patients progress and decline. This makes decision making difficult for them. Husband shares "I'm not ready to lose her".   Advanced directives, concepts specific to code status, artifical feeding and hydration, and rehospitalization were considered and discussed. They are thinking through their options and are hopeful patient's mental status continues to improve so they can discuss these issues with her.   Hospice and Palliative Care services outpatient were explained and offered. They asked  many questions about hospice. Husband at one point said all he wanted to do was to take her home with hospice. We discussed what this would look like.   Questions and concerns were addressed. The family was encouraged to call with questions or concerns.   Recommendations/Plan:  Will follow up with family tomorrow regarding goals of care and code status  Could try to pull feeding tube tomorrow if patient's mental status is still improving and allow SLP to work with her  Would be beneficial to complete MOST form before patient is discharged  If discharged, will need outpatient palliative at least  Code Status:  Full code  Prognosis:   Unable to determine poor long-term prognosis d/t functional declines and failure to thrive  Discharge Planning:  To Be Determined - likely SNF rehab with palliative if no acute decompensation  Care plan was discussed with RN, Dr. Anselm Jungling, patient's daughter, son, and husband  Thank you for allowing the Palliative Medicine Team to assist in the care of this patient.  15:00-17:00 Total Time 120 minutes  Prolonged Time Billed  yes      Greater than 50%  of this time was spent counseling and coordinating care related to the above assessment and plan.  Juel Burrow, DNP, [facility] Palliative Medicine Team Team Phone # [phone]  Pager [phone]

## 2017-10-01 NOTE — Care Management Important Message (Signed)
Copy of signed IM left with patient in room.  

## 2017-10-01 NOTE — Progress Notes (Signed)
Physical Therapy Treatment Patient Details Name: Nina Hudson MRN: [id number] DOB: 1945-04-27 Today's Date: 10/01/2017    History of Present Illness 72yo female pt presented to ER secondary to SOB, weakness, lethargy, noted with guaic positive stools; admitted with acute/chronic respiratory failure secondary to R LL PNA (requiring intubation 6/19-6/21), now on 2L supplemental O2. Of note, patient with recent hospitalization at outside hospital (6/2-6/14) due to acute cholecystitis/choledocholethiasis, status post ERCP with stent and stone removal and laprascopic cholecystectomy.  Course complicated by cystic artery bleed requiring exploratory laparotomy.  Discharge to STR due to significant weakness/deconditioning after extended hospitalization.    PT Comments    Participated in exercises as described below.  Pt noted to inc of urine needing full bed change.  Took opportunity to work on Customer service manager.  While pt made off effort of reaching for rails, she was unable to fully turn without mod assist.  Min assist needed to hold position in side lying for care.  Pt fatigued with effort.   Follow Up Recommendations  SNF     Equipment Recommendations       Recommendations for Other Services       Precautions / Restrictions Precautions Precautions: Fall Precaution Comments: NPO/NGT Restrictions Weight Bearing Restrictions: No    Mobility  Bed Mobility Overal bed mobility: Needs Assistance Bed Mobility: Rolling Rolling: Mod assist         General bed mobility comments: good effort reachig for rails for care but unable to fully turn without assist  Transfers                 General transfer comment: unsafe/unable  Ambulation/Gait             General Gait Details: unsafe/unable   Stairs             Wheelchair Mobility    Modified Rankin (Stroke Patients Only)       Balance                                            Cognition  Arousal/Alertness: Lethargic Behavior During Therapy: WFL for tasks assessed/performed Overall Cognitive Status: Within Functional Limits for tasks assessed                                        Exercises Other Exercises Other Exercises: Bilat LE therex, 1x10, act assist ROM: ankle pumps, SAQs, heel slides, SLR and hip abduct/adduct.  Pt with good effort but unable to complete without assist. Other Exercises: Pt inc of urine needing full bed change.  Assisted with nursing staff.    General Comments        Pertinent Vitals/Pain Pain Assessment: No/denies pain    Home Living                      Prior Function            PT Goals (current goals can now be found in the care plan section) Progress towards PT goals: Not progressing toward goals - comment    Frequency    Min 2X/week      PT Plan Current plan remains appropriate    Co-evaluation              AM-PAC PT "6  Clicks" Daily Activity  Outcome Measure  Difficulty turning over in bed (including adjusting bedclothes, sheets and blankets)?: Unable Difficulty moving from lying on back to sitting on the side of the bed? : Unable Difficulty sitting down on and standing up from a chair with arms (e.g., wheelchair, bedside commode, etc,.)?: Unable Help needed moving to and from a bed to chair (including a wheelchair)?: Total Help needed walking in hospital room?: Total Help needed climbing 3-5 steps with a railing? : Total 6 Click Score: 6    End of Session Equipment Utilized During Treatment: Oxygen Activity Tolerance: Patient limited by fatigue Patient left: in bed;with nursing/sitter in room;with call bell/phone within reach Nurse Communication: Mobility status       Time: 4496-7591 PT Time Calculation (min) (ACUTE ONLY): 18 min  Charges:  $Therapeutic Exercise: 8-22 mins                    G Codes:       Chesley Noon, PTA 10/01/17, 3:19 PM

## 2017-10-02 LAB — CBC
HEMATOCRIT: 32.5 % — AB (ref 35.0–47.0)
HEMOGLOBIN: 11.1 g/dL — AB (ref 12.0–16.0)
MCH: 31.8 pg (ref 26.0–34.0)
MCHC: 34.3 g/dL (ref 32.0–36.0)
MCV: 92.9 fL (ref 80.0–100.0)
Platelets: 220 10*3/uL (ref 150–440)
RBC: 3.5 MIL/uL — ABNORMAL LOW (ref 3.80–5.20)
RDW: 21.2 % — ABNORMAL HIGH (ref 11.5–14.5)
WBC: 9.6 10*3/uL (ref 3.6–11.0)

## 2017-10-02 LAB — BASIC METABOLIC PANEL
Anion gap: 7 (ref 5–15)
BUN: 40 mg/dL — ABNORMAL HIGH (ref 8–23)
CO2: 34 mmol/L — AB (ref 22–32)
Calcium: 8.7 mg/dL — ABNORMAL LOW (ref 8.9–10.3)
Chloride: 98 mmol/L (ref 98–111)
Creatinine, Ser: 0.83 mg/dL (ref 0.44–1.00)
GFR calc Af Amer: >60 mL/min (ref >60)
GFR calc non Af Amer: >60 mL/min (ref >60)
GLUCOSE: 212 mg/dL — AB (ref 70–99)
POTASSIUM: 4.4 mmol/L (ref 3.5–5.1)
Sodium: 139 mmol/L (ref 135–145)

## 2017-10-02 LAB — GLUCOSE, CAPILLARY
GLUCOSE-CAPILLARY: 175 mg/dL — AB (ref 70–99)
GLUCOSE-CAPILLARY: 179 mg/dL — AB (ref 70–99)
GLUCOSE-CAPILLARY: 227 mg/dL — AB (ref 70–99)
Glucose-Capillary: 144 mg/dL — ABNORMAL HIGH (ref 70–99)
Glucose-Capillary: 154 mg/dL — ABNORMAL HIGH (ref 70–99)
Glucose-Capillary: 204 mg/dL — ABNORMAL HIGH (ref 70–99)

## 2017-10-02 LAB — PHOSPHORUS: Phosphorus: 3.5 mg/dL (ref 2.5–4.6)

## 2017-10-02 LAB — MAGNESIUM: Magnesium: 2 mg/dL (ref 1.7–2.4)

## 2017-10-02 MED ORDER — DILTIAZEM HCL 30 MG PO TABS
60.0000 mg | ORAL_TABLET | Freq: Four times a day (QID) | ORAL | Status: DC
  Administered 2017-10-02 – 2017-10-06 (×15): 60 mg via ORAL
  Filled 2017-10-02 (×15): qty 2

## 2017-10-02 MED ORDER — INSULIN ASPART 100 UNIT/ML ~~LOC~~ SOLN
0.0000 [IU] | Freq: Three times a day (TID) | SUBCUTANEOUS | Status: DC
  Administered 2017-10-03: 5 [IU] via SUBCUTANEOUS
  Administered 2017-10-03: 3 [IU] via SUBCUTANEOUS
  Administered 2017-10-03 – 2017-10-04 (×4): 5 [IU] via SUBCUTANEOUS
  Administered 2017-10-04 (×2): 3 [IU] via SUBCUTANEOUS
  Administered 2017-10-05: 8 [IU] via SUBCUTANEOUS
  Administered 2017-10-05 (×2): 11 [IU] via SUBCUTANEOUS
  Administered 2017-10-05: 3 [IU] via SUBCUTANEOUS
  Administered 2017-10-06: 8 [IU] via SUBCUTANEOUS
  Administered 2017-10-06: 15 [IU] via SUBCUTANEOUS
  Administered 2017-10-06: 3 [IU] via SUBCUTANEOUS
  Filled 2017-10-02 (×15): qty 1

## 2017-10-02 MED ORDER — DIGOXIN 0.25 MG/ML IJ SOLN
0.2500 mg | Freq: Once | INTRAMUSCULAR | Status: AC
  Administered 2017-10-02: 0.25 mg via INTRAVENOUS
  Filled 2017-10-02: qty 2

## 2017-10-02 MED ORDER — DIGOXIN 0.25 MG/ML IJ SOLN
0.5000 mg | Freq: Once | INTRAMUSCULAR | Status: AC
  Administered 2017-10-02: 0.5 mg via INTRAVENOUS
  Filled 2017-10-02: qty 2

## 2017-10-02 MED ORDER — TRAZODONE HCL 50 MG PO TABS
50.0000 mg | ORAL_TABLET | Freq: Every day | ORAL | Status: DC
  Administered 2017-10-02 – 2017-10-05 (×4): 50 mg via ORAL
  Filled 2017-10-02 (×4): qty 1

## 2017-10-02 MED ORDER — ADULT MULTIVITAMIN W/MINERALS CH
1.0000 | ORAL_TABLET | Freq: Every day | ORAL | Status: DC
  Administered 2017-10-03 – 2017-10-06 (×3): 1 via ORAL
  Filled 2017-10-02 (×4): qty 1

## 2017-10-02 NOTE — Progress Notes (Signed)
Pharmacy Electrolyte Monitoring Consult:  Pharmacy consulted to assist in monitoring and replacing electrolytes in this 72 y.o. female admitted on 09/23/2017 with PNA  Labs:  Sodium (mmol/L)  Date Value  10/02/2017 139  04/11/2014 138   Potassium (mmol/L)  Date Value  10/02/2017 4.4  04/11/2014 3.9   Magnesium (mg/dL)  Date Value  10/02/2017 2.0  06/07/2012 1.8   Phosphorus (mg/dL)  Date Value  10/02/2017 3.5  01/16/2012 3.0   Calcium (mg/dL)  Date Value  10/02/2017 8.7 (L)   Calcium, Total (mg/dL)  Date Value  04/11/2014 8.1 (L)   Albumin (g/dL)  Date Value  09/25/2017 2.3 (L)  03/01/2013 3.4   Assessment/Plan: K, Mag, and Phos at goal.  Will d/c KCl 20 MEQ daily (pt on torsemide PTA, not currently ordered) and recheck in 2 days  Rayna Sexton, PharmD, BCPS Clinical Pharmacist 10/02/2017 12:34 PM

## 2017-10-02 NOTE — Progress Notes (Signed)
Talked to Dr. Ubaldo Glassing about patient's HR sustaining 130's-140's patient is still hypotensive at 101/75. Previous RN held cardizem dose this morning due to hypotension. Patient is currently NPO, speech need to see her for swallowing eval. Order to give digoxin 0.25 IV now. No other concern at the moment. RN will continue to monitor.

## 2017-10-02 NOTE — Progress Notes (Signed)
Speech Therapy Note:  Reviewed chart notes; consulted NSG. MD has given order to pull NG tube to allow pt to have an oral diet to determine if she will be able to meet her needs orally. Pt remains drowsy off/on. Palliative Care has been meeting w/ pt/family to determine [place]. NG tube is out this afternoon, however, pt's HR is elevated per NSG report.  Informed NSG that the similar dysphagia diet order (dyspahgia level 1 w/ Nectar liquids) as pt had been assessed w/, and placed on, by ST services earlier this week would be reinstated w/ strict aspiration precautions and NSG to monitor at meals for tolerance. Family has decided on letting how she does w/ her oral intake guide decision making after this weekend per Palliative Care note.  ST services will f/u tomorrow w/ toleration of diet and trials to upgrade as pt is able. Discussed this plan thoroughly w/ NSG who agreed. Pills w/ be given Crushed in a Puree - NSG to check w/ pharmacy for crushability of Pills. NSG agreed.    Orinda Kenner, [place], CCC-SLP

## 2017-10-02 NOTE — Progress Notes (Signed)
Daily Progress Note   Patient Name: Nina Hudson       Date: 10/02/2017 DOB: August 02, 1945  Age: 72 y.o. MRN#: [id number] Attending Physician: Vaughan Basta, * Primary Care Physician: Derinda Late, MD Admit Date: 09/23/2017  Reason for Consultation/Follow-up: Establishing goals of care and Psychosocial/spiritual support  Subjective: More alert today but still drowsy - opens eyes to voice, one word answers to questions. Follows commands. Tube feeds restarted last night. Daughter at bedside.   Length of Stay: 9  Current Medications: Scheduled Meds:  . budesonide (PULMICORT) nebulizer solution  0.5 mg Nebulization BID  . diltiazem  30 mg Oral Q6H  . feeding supplement (PRO-STAT SUGAR FREE 64)  60 mL Per Tube BID  . free water  30 mL Per Tube Q4H  . glipiZIDE  5 mg Oral QAC breakfast  . insulin aspart  0-15 Units Subcutaneous Q4H  . ipratropium-albuterol  3 mL Nebulization Q6H  . mouth rinse  15 mL Mouth Rinse BID  . methylPREDNISolone (SOLU-MEDROL) injection  40 mg Intravenous Daily  . multivitamin  15 mL Per Tube Daily  . pantoprazole (PROTONIX) IV  40 mg Intravenous Q12H  . potassium chloride  20 mEq Oral Daily    Continuous Infusions: . feeding supplement (OSMOLITE 1.5 CAL) 15 mL/hr at 10/01/17 1844  . piperacillin-tazobactam (ZOSYN)  IV 3.375 g (10/02/17 1111)    PRN Meds: acetaminophen **OR** acetaminophen, ipratropium-albuterol, polyethylene glycol, sennosides  Physical Exam   Constitutional: She is lethargic. Responds to voice, answers yes/no questions. Follows commands. HENT:  Head: Normocephalic and atraumatic.  Cardiovascular: An irregular rhythm present. Tachycardic. Pulmonary/Chest: Diminished breath sounds.  Abdominal: Soft. Bowel sounds are normal.    Musculoskeletal: She exhibits edema.  Neurological:  She is lethargic. Responds to voice, answers yes/no questions Skin: Skin is warm and dry.          Vital Signs: BP 92/82 (BP Location: Right Wrist)   Pulse (!) 130   Temp 98.1 F (36.7 C) (Oral)   Resp 14   Ht 5' 5" (1.651 m)   Wt 63.3 kg (139 lb 9.6 oz)   SpO2 95%   BMI 23.23 kg/m  SpO2: SpO2: 95 % O2 Device: O2 Device: Nasal Cannula O2 Flow Rate: O2 Flow Rate (L/min): 4 L/min  Intake/output summary:   Intake/Output Summary (Last 24 hours) at 10/02/2017 1131 Last data filed at 10/02/2017 0700 Gross per 24 hour  Intake 309.21 ml  Output 800 ml  Net -490.79 ml   LBM: Last BM Date: 09/29/17 Baseline Weight: Weight: 59 kg (130 lb) Most recent weight: Weight: 63.3 kg (139 lb 9.6 oz)       Palliative Assessment/Data: PPS 10%    Flowsheet Rows     Most Recent Value  Intake Tab  Referral Department  Critical care  Unit at Time of Referral  ICU  Palliative Care Primary Diagnosis  Sepsis/Infectious Disease  Date Notified  09/23/17  Palliative Care Type  New Palliative care  Reason for referral  Clarify Goals of Care  Date of Admission  09/23/17  Date first seen by Palliative Care  09/24/17  # of days Palliative referral response time  1 Day(s)  # of days IP  prior to Palliative referral  0  Clinical Assessment  Palliative Performance Scale Score  10%  Psychosocial & Spiritual Assessment  Palliative Care Outcomes  Patient/Family meeting held?  Yes  Who was at the meeting?  son, daughter, husband  Palliative Care Outcomes  Provided psychosocial or spiritual support, Counseled regarding hospice, ACP counseling assistance      Patient Active Problem List   Diagnosis Date Noted  . Goals of care, counseling/discussion   . Palliative care by specialist   . Pneumonia 09/23/2017  . Altered mental status 09/23/2017  . Acute respiratory failure with hypoxia ([place]) 09/23/2017  . Pressure injury of skin 09/23/2017     Palliative Care Assessment & Plan   HPI: 72 y.o. 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 Per NP note from facility, patient was found minimally responsive with tachypnea and tachycardia, mottled legs. CXR at facility revealed pna vs edema. Husband was called and he wanted full code and transfer to hospital. Patient was supposed to be seen by palliative care at the facility but the visit had not been completed yet. Patient was recently hospitalized at [facility] 2/5-8/52DPO complicated cholecystitis with cholecystectomy. She underwent an ERCP with stent placement and stone extraction with lap chole. She developed hypotension and bleeding postop requiring emergent transport back to the OR the same day, and wasfound to have hemoperitoneum due to clip falling off cystic artery resulting in cystic artery stump bleed requiring ligation. She was discharged from [facility] on 09/18/17 to the [place] [facility] for rehab. Since discharge from [facility] she has been weak and fatigued with minimal PO intake. Upon arrival to [facility] pt required intubation d/t agonal respirations. Found to be guaiac positive and hgb 8.4 down from 10.2. Received 1 unit PRBCs. Lactic acid 7.5. CT abd from 6/20 revealed small amount of perihepatic and pelvic ascites and stable placement of patent metallic biliary stent. She was extubated 6/21. Chest xray worsening as of 6/25, worsening respiratory and mental status. No PO intake. Tube feeding stopped d/t concern for aspiration. PMT consulted for [place].   Assessment: Follow up with patient today - upon my assessment patient is more alert than yesterday yet still drowsy.  I met at the bedside along with patient's  2 children to discuss diagnosis prognosis, GOC, EOL wishes, disposition and options.  As far as functional and nutritional status, pt has had a steady decline over the past year. She had a stroke 1-2 years ago that impacted her functional status and since then she has been declining. Her children describe her as tired and depressed most of the time and feel she has "no will to live". After her hospitalization at [facility] this month she significantly declined. At rehab, she would not eat or get out of bed and seemed exhausted everyday. Her children feel that she is "ready to die".  I shared with them my concerns about her long term prognosis given her severe debility prior to this hospitalization.    We discussed her current illness and what it means in the larger context of her on-going co-morbidities.  We discussed concerns about maintaining nutrition and we discussed risks for pneumonia. They ask about options and we discussed careful hand feeding  vs PEG tube. Thoroughly discussed risks and benefits of each.   Today we discussed removing feeding tube and allow patient to attempt to eat. Will monitor how she does over the weekend and then readdress [place] next week - letting how she does guide decision making.   Questions and concerns were addressed. The family was encouraged to call with questions or concerns.   Discussed case with Dr. Anselm Jungling and speech therapy  Recommendations/Plan:  Pull feeding tube today, attempt to start POs - careful hand feeding while patient is sitting up - SLP to recommend diet  Will monitor over the weekend and follow up at the beginning of next week letting how patient does guide decision making  Would be beneficial to complete MOST form before patient is discharged  If discharged, will need outpatient palliative at least  Code Status:  Full code  Prognosis:   Unable to determine poor long-term prognosis d/t functional declines and failure to thrive  Discharge  Planning:  To Be Determined - likely SNF rehab with palliative if no acute decompensation  Care plan was discussed with RN, Dr. Anselm Jungling, patient's daughter, son,   Thank you for allowing the Palliative Medicine Team to assist in the care of this patient.   Total Time 40 minutes  Prolonged Time Billed  no      Greater than 50%  of this time was spent counseling and coordinating care related to the above assessment and plan.  Juel Burrow, DNP, [facility] Palliative Medicine Team Team Phone # [phone]  Pager [phone]

## 2017-10-02 NOTE — Consult Note (Signed)
Pharmacy Antibiotic Note  Nina Hudson is a 72 y.o. female admitted on 09/23/2017 with pneumonia.  Pharmacy has been consulted for zosyn dosing.  Plan: Zosyn 3.375g IV q8h (4 hour infusion).   Pt has had 7 days of zosyn. Per Dr. Arcola Jansky would like 3 more days to end 6/28  Height: 5\' 5"  (165.1 cm) Weight: 139 lb 9.6 oz (63.3 kg) IBW/kg (Calculated) : 57  Temp (24hrs), Avg:97.7 F (36.5 C), Min:97.5 F (36.4 C), Max:98.1 F (36.7 C)  Recent Labs  Lab 09/26/17 0641 09/26/17 0746 09/27/17 0646 09/28/17 0416 09/29/17 0445 09/30/17 0532 10/02/17 0738  WBC  --  4.6  --   --  5.3 6.3 9.6  CREATININE 0.52  --  0.63 0.46 0.55 0.56 0.83  LATICACIDVEN 1.2  --   --   --   --   --   --     Estimated Creatinine Clearance: 54.3 mL/min (by C-G formula based on SCr of 0.83 mg/dL).    Allergies  Allergen Reactions  . Sulfa Antibiotics Other (See Comments)    "I get real achey" Other reaction(s): Muscle Pain, Other (See Comments)  General body aches andpain.    BCx x2 NG Trach aspirate pseudomonas aeruginosa UCx NG MRSA PCR neg  Thank you for allowing pharmacy to be a part of this patient's care.  Rayna Sexton, PharmD, BCPS Clinical Pharmacist 10/02/2017 12:26 PM

## 2017-10-02 NOTE — Progress Notes (Signed)
MEDICATION RELATED CONSULT NOTE - INITIAL   Pharmacy Consult for Crushability of Medication Indication: recommendation from ST to give meds crushed in Puree   Medications:  Scheduled:  . budesonide (PULMICORT) nebulizer solution  0.5 mg Nebulization BID  . diltiazem  60 mg Oral Q6H  . glipiZIDE  5 mg Oral QAC breakfast  . insulin aspart  0-15 Units Subcutaneous Q4H  . ipratropium-albuterol  3 mL Nebulization Q6H  . mouth rinse  15 mL Mouth Rinse BID  . methylPREDNISolone (SOLU-MEDROL) injection  40 mg Intravenous Daily  . [START ON 10/03/2017] multivitamin with minerals  1 tablet Oral Daily  . pantoprazole (PROTONIX) IV  40 mg Intravenous Q12H    Assessment: Reviewed current medications for crushability  Plan:  Current oral medications can be crushed. Will review daily for any additions to medication therapy.  Paulina Fusi, PharmD, BCPS 10/02/2017 4:25 PM

## 2017-10-02 NOTE — Progress Notes (Signed)
[facility] [place] NAME: Alenah Sarria    MR#:  [id number]  DATE OF BIRTH:  1945-10-29  SUBJECTIVE:  patient extubated. He appears week. Continue tube feeding secondary to poor PO intake. History of trouble swallowing speech eval done. Patient extremity deconditioned. Malnourished. Breathing calm today. Sats stable.  REVIEW OF SYSTEMS:   Review of Systems  Constitutional: Negative for chills, fever and weight loss.  HENT: Negative for ear discharge, ear pain and nosebleeds.   Eyes: Negative for blurred vision, pain and discharge.  Respiratory: Negative for sputum production, shortness of breath, wheezing and stridor.   Cardiovascular: Negative for chest pain, palpitations, orthopnea and PND.  Gastrointestinal: Negative for abdominal pain, diarrhea, nausea and vomiting.  Genitourinary: Negative for frequency and urgency.  Musculoskeletal: Negative for back pain and joint pain.  Neurological: Positive for weakness. Negative for sensory change, speech change and focal weakness.  Psychiatric/Behavioral: Negative for depression and hallucinations. The patient is not nervous/anxious.    Tolerating Diet:ng feeding Tolerating PT: pending  DRUG ALLERGIES:   Allergies  Allergen Reactions  . Sulfa Antibiotics Other (See Comments)    "I get real achey" Other reaction(s): Muscle Pain, Other (See Comments)  General body aches andpain.     VITALS:  Blood pressure 101/75, pulse (!) 113, temperature 98.1 F (36.7 C), temperature source Oral, resp. rate 14, height 5\' 5"  (1.651 m), weight 63.3 kg (139 lb 9.6 oz), SpO2 96 %.  PHYSICAL EXAMINATION:   Physical Exam  GENERAL:  72 y.o.-year-old patient lying in the bed with no acute distress. Very deconditioned, anasarca EYES: Pupils equal, round, reactive to light and accommodation. No scleral icterus. Extraocular muscles intact.  HEENT: Head atraumatic, normocephalic. Oropharynx and  nasopharynx clear. Ng+ NECK:  Supple, no jugular venous distention. No thyroid enlargement, no tenderness.  LUNGS: decreased sound on right side, no wheezing, rales, rhonchi. positive use of accessory muscles of respiration.  CARDIOVASCULAR: S1, S2 normal. No murmurs, rubs, or gallops.  ABDOMEN: Soft, nontender, nondistended. Bowel sounds present. No organomegaly or mass. Foley in place, dark red to brown urine in the container,. EXTREMITIES: No cyanosis, clubbing Upper extremity edema, echymosis. NEUROLOGIC: Cranial nerves II through XII are intact. No focal Motor or sensory deficits b/l.very weak   PSYCHIATRIC:  patient is alert and oriented x 2.  SKIN: No obvious rash, lesion, or ulcer. Bruises+  LABORATORY PANEL:  CBC Recent Labs  Lab 10/02/17 0738  WBC 9.6  HGB 11.1*  HCT 32.5*  PLT 220    Chemistries  Recent Labs  Lab 10/02/17 0738  NA 139  K 4.4  CL 98  CO2 34*  GLUCOSE 212*  BUN 40*  CREATININE 0.83  CALCIUM 8.7*  MG 2.0   Cardiac Enzymes No results for input(s): TROPONINI in the last 168 hours. RADIOLOGY:  No results found. ASSESSMENT AND PLAN:   CarolMcCauleyis a12 y.o.femalewith a known history of diffuse large B cell lymphoma, uterine cancer, history of a fib on eliquis, diabetes, comes to the emergency room from [facility] where she was admitted on [id number]. Patient is being admitted type for acute on chronic hypoxic respiratory failure secondary to suspected pneumonia/G.I. bleed/acute on chronic anemia.  1.acute on chronic hypoxic respiratory failure secondary to right lower lobe pneumonia -PE has been ruled out - pneumonia positive tracheal aspirate Gram stain --growing pseudomonas - as worsening on Xray chest ( 09/30/17)- will cont zosyn and stop NG and oral feeds. - Given IV antibiotic with  Zosyn--stopped as finished course. - I spoke to her son and husband today, explained about poor prognosis and encourage to make further decisions.  -  discussed about options of comfort care vs continued care and feeding tube options. - as pt have some improvement in mental condition, family want to watch her status over the weekend. Will d/c feeding tube and start oral feed per SLP eval.  2.  Acute encephalopathy EEG shows slowing with sleep state Likely due to pneumonia  3. acute on chronic anemia -patient on eliquis -holding it at present -Status post 1 unit of transfusion -Hemoglobin stable  3.History of ERCP with stone removal and stent placement along with lap Coley complicated by cystic artery bleed requiring exploratory laparotomy -this was done at [facility] on 6/5 /2019  4.Diabetes sliding scale insulin for now  5.  A. fib with RVR - stable with oral metoprolol and cardizem. - we had to stop metoprolol due to Borderline BP, cont to stay tachy and A fib- called cardio consult- started on Digoxine and suggest to increase cardizem- IF tolerates.  6.DVT prophylaxis SCD  and Ted's and eliquis   Hold eliquis as there is hematuria.  7. Poor PO intake with malnutrition - was getting NG tube feeding. - Worsening pneumonia, may be more aspirations- so held feeding. - we may need PEG placement, if family want to continue aggressive management, - as pt is more awake will give a trial of oral feeding.  Case discussed with Care Management/Social Worker. Management plans discussed with the patient, family and they are in agreement.  CODE STATUS: full  DVT Prophylaxis: eliquis  TOTAL TIME TAKING CARE OF THIS PATIENT: *40* minutes.  >50% time spent on counselling and coordination of care Seen pt twice, and tried calling her husband to discuss about her condiition and code status.  POSSIBLE D/C IN few DAYS, DEPENDING ON CLINICAL CONDITION.  Note: This dictation was prepared with Dragon dictation along with smaller phrase technology. Any transcriptional errors that result from this process are unintentional.  Vaughan Basta M.D on 10/02/2017 at 4:39 PM  Between 7am to 6pm - Pager - [phone]  After 6pm go to [email or web address] - password [email or web address] [facility]  Office  [phone]  CC: Primary care physician; Derinda Late, MDPatient ID: Theodoro Parma, female   DOB: [birth year], 72 y.o.   MRN: [id number]

## 2017-10-02 NOTE — Progress Notes (Signed)
Dubhoff tube removed from patient. Tolerated well. Will continue to assess and monitor.

## 2017-10-02 NOTE — Consult Note (Signed)
Cardiology Consultation Note    Patient ID: Nina Hudson, MRN: [id number], DOB/AGE: 1945-10-30 73 y.o. Admit date: 09/23/2017   Date of Consult: 10/02/2017 Primary Physician: Derinda Late, MD Primary Cardiologist:    Chief Complaint: Weakness and fatigue Reason for Consultation: Atrial fibrillation Requesting MD: Dr. Anselm Jungling  HPI: Nina Hudson is a 72 y.o. female with history of atrial fibrillation treated with rate control and anticoagulated with Eliquis, history of diffuse large B-cell lymphoma, history of uterine carcinoma, history of diabetes admitted on 09/23/2017 from [place].  She had a fairly prolonged complicated stay at [facility] with acute cholecystitis choledocholithiasis and underwent ERCP.  Postprocedure course was complicated by a respiratory failure and the patient was noted to have a hemoperitoneum secondary to cystic artery stump bleed.  After recovering from this she was discharged to [facility].  She was readmitted on the 19th of this month with lethargy and hypoxia.  She was intubated and placed on a ventilator.  She is not currently anticoagulated and she had evidence of hematuria with was complicated by anemia.  She received transfusion as well as empiric antibiotics.  She was evaluated with an EEG which did not show any epileptiform activity.  She subsequently was extubated.  Continues to be fairly malnourished and weak.  She has been seen by palliative care.  Renal function appears fairly good.  Consultation is for atrial fibrillation.  Patient has chronic atrial fibrillation.  Rate has variable response.  Patient is currently on diltiazem 30 mg every 6.  Past Medical History:  Diagnosis Date  . Arthritis   . Cancer (HCC)    diffuse large B cell lymphoma  . Cancer ([place])    uterine  . Depression   . Diabetes mellitus without complication ([place])   . Dysrhythmia    A-fib  . Hypercholesteremia   . Hypertension   . Stroke [facility])       Surgical History:   Past Surgical History:  Procedure Laterality Date  . ABDOMINAL HYSTERECTOMY    . APPENDECTOMY    . BUNIONECTOMY Bilateral   . FRACTURE SURGERY Right    ankle  . MASS EXCISION N/A 09/28/2015   Procedure: EXCISION OF 2 CYSTIC MASSES FROM MEDIAL STERNUM;  Surgeon: Dereck Leep, MD;  Location: [facility] ORS;  Service: Orthopedics;  Laterality: N/A;  . rheumatoid nodual removal    . TUBAL LIGATION       Home Meds: Prior to Admission medications   Medication Sig Start Date End Date Taking? Authorizing Provider  albuterol (PROVENTIL) (2.5 MG/3ML) 0.083% nebulizer solution Take 2.5 mg by nebulization every 6 (six) hours as needed for wheezing or shortness of breath.   Yes [provider]  atorvastatin (LIPITOR) 40 MG tablet Take 40 mg by mouth daily. 07/13/17  Yes [provider]  carboxymethylcellulose (REFRESH PLUS) 0.5 % SOLN Place 2 drops into both eyes 3 (three) times daily as needed (dry eyes).   Yes [provider]  diltiazem (CARDIZEM CD) 120 MG 24 hr capsule Take 120 mg by mouth daily. 07/13/17  Yes [provider]  donepezil (ARICEPT) 5 MG tablet Take 5 mg by mouth every evening. 08/11/17  Yes [provider]  ELIQUIS 5 MG TABS tablet Take 5 mg by mouth 2 (two) times daily. 09/02/17  Yes [provider]  ferrous sulfate 325 (65 FE) MG tablet Take 325 mg by mouth 2 (two) times daily.   Yes [provider]  gabapentin (NEURONTIN) 100 MG capsule  Take 100 mg by mouth every evening. 06/29/17  Yes [provider]  glipiZIDE (GLUCOTROL XL) 5 MG 24 hr tablet Take 5 mg by mouth daily.  08/06/17  Yes [provider]  magnesium oxide (MAG-OX) 400 MG tablet Take 800 mg by mouth daily.   Yes [provider]  Melatonin 3 MG TABS Take 3 mg by mouth at bedtime as needed (sleep).   Yes [provider]  metFORMIN (GLUCOPHAGE) 500 MG tablet Take 500 mg by mouth 2 (two) times daily. 08/06/17  Yes [provider]   metoprolol tartrate (LOPRESSOR) 50 MG tablet Take 50 mg by mouth 2 (two) times daily.   Yes [provider]  oxyCODONE (OXY IR/ROXICODONE) 5 MG immediate release tablet Take 2.5 mg by mouth every 4 (four) hours as needed for severe pain.   Yes [provider]  polyethylene glycol (MIRALAX / GLYCOLAX) packet Take 17 g by mouth daily.   Yes [provider]  potassium chloride (K-DUR,KLOR-CON) 10 MEQ tablet Take 10 mEq by mouth daily.   Yes [provider]  torsemide (DEMADEX) 10 MG tablet Take 10 mg by mouth daily.   Yes [provider]  vitamin C (ASCORBIC ACID) 500 MG tablet Take 500 mg by mouth 2 (two) times daily. (take with ferrous sulfate tablets)   Yes [provider]  amoxicillin-clavulanate (AUGMENTIN) 875-125 MG tablet Take 1 tablet by mouth 2 (two) times daily. Patient not taking: Reported on 09/23/2017 08/09/17   [name], NP    Inpatient Medications:  . budesonide (PULMICORT) nebulizer solution  0.5 mg Nebulization BID  . diltiazem  30 mg Oral Q6H  . feeding supplement (PRO-STAT SUGAR FREE 64)  60 mL Per Tube BID  . free water  30 mL Per Tube Q4H  . glipiZIDE  5 mg Oral QAC breakfast  . insulin aspart  0-15 Units Subcutaneous Q4H  . ipratropium-albuterol  3 mL Nebulization Q6H  . mouth rinse  15 mL Mouth Rinse BID  . methylPREDNISolone (SOLU-MEDROL) injection  40 mg Intravenous Daily  . multivitamin  15 mL Per Tube Daily  . pantoprazole (PROTONIX) IV  40 mg Intravenous Q12H   . feeding supplement (OSMOLITE 1.5 CAL) 15 mL/hr at 10/01/17 1844  . piperacillin-tazobactam (ZOSYN)  IV 3.375 g (10/02/17 1111)    Allergies:  Allergies  Allergen Reactions  . Sulfa Antibiotics Other (See Comments)    "I get real achey" Other reaction(s): Muscle Pain, Other (See Comments)  General body aches andpain.     Social History   Socioeconomic History  . Marital status: Married    Spouse name: Not on file  . Number of  children: Not on file  . Years of education: Not on file  . Highest education level: Not on file  Occupational History  . Not on file  Social Needs  . Financial resource strain: Not on file  . Food insecurity:    Worry: Not on file    Inability: Not on file  . Transportation needs:    Medical: Not on file    Non-medical: Not on file  Tobacco Use  . Smoking status: Former Smoker    Last attempt to quit: 09/12/2007    Years since quitting: 10.0  . Smokeless tobacco: Never Used  Substance and Sexual Activity  . Alcohol use: Yes    Comment: 1-2 glass of wine a month.  . Drug use: No  . Sexual activity: Not Currently  Lifestyle  . Physical  activity:    Days per week: Not on file    Minutes per session: Not on file  . Stress: Not on file  Relationships  . Social connections:    Talks on phone: Not on file    Gets together: Not on file    Attends religious service: Not on file    Active member of club or organization: Not on file    Attends meetings of clubs or organizations: Not on file    Relationship status: Not on file  . Intimate partner violence:    Fear of current or ex partner: Not on file    Emotionally abused: Not on file    Physically abused: Not on file    Forced sexual activity: Not on file  Other Topics Concern  . Not on file  Social History Narrative  . Not on file     History reviewed. No pertinent family history.   Review of Systems: A 12-system review of systems was performed and is negative except as noted in the HPI.  Labs: No results for input(s): CKTOTAL, CKMB, TROPONINI in the last 72 hours. Lab Results  Component Value Date   WBC 9.6 10/02/2017   HGB 11.1 (L) 10/02/2017   HCT 32.5 (L) 10/02/2017   MCV 92.9 10/02/2017   PLT 220 10/02/2017    Recent Labs  Lab 10/02/17 0738  NA 139  K 4.4  CL 98  CO2 34*  BUN 40*  CREATININE 0.83  CALCIUM 8.7*  GLUCOSE 212*   Lab Results  Component Value Date   CHOL 160 07/14/2012   HDL 40  07/14/2012   LDLCALC 91 07/14/2012   TRIG 143 07/14/2012   No results found for: DDIMER  Radiology/Studies:  Ct Abdomen Pelvis Wo Contrast  Result Date: 09/24/2017 CLINICAL DATA:  S/P ERCP and stent for stone removal. Now with persistent lactic acidosis. PO contrast only pe chest done yesterday. EXAM: CT ABDOMEN AND PELVIS WITHOUT CONTRAST TECHNIQUE: Multidetector CT imaging of the abdomen and pelvis was performed following the standard protocol without IV contrast. COMPARISON:  CT chest from previous day, PET-CT 11/16/2012, and earlier studies FINDINGS: Lower chest: Bilateral pleural effusions right greater than left as before. Adjacent consolidation/atelectasis posteriorly in the lower lobes. Scattered subpleural patchy somewhat nodular opacities in the visualized lung bases as before. Hepatobiliary: Metallic CBD stent. Gas in the intrahepatic biliary tree implying stent patency. Cholecystectomy clips. No discrete liver lesion. Pancreas: Scattered coarse calcifications in the pancreatic head near the stent. No mass or ductal dilatation. Spleen: Normal in size without focal abnormality. Adrenals/Urinary Tract: Unremarkable adrenals. Residual contrast in the renal collecting systems without hydronephrosis. No focal renal lesion is evident. Foley catheter partially decompresses the urinary bladder. Stomach/Bowel: Stomach is nondilated. Incomplete distal passage of oral contrast material. Small bowel grossly unremarkable. Multiple sigmoid diverticula. Fecal material distends the rectum. There are mild perirectal and presacral edematous/inflammatory changes. Vascular/Lymphatic: Aortoiliac atherosclerosis (ICD10-170.0). Infrarenal aorta 2.6 cm diameter. Tortuous venous collateral channels in the left retroperitoneum associated with the left renal vein, present on studies dating back to 05/22/2007. No abdominal or pelvic adenopathy localized. Reproductive: Status post hysterectomy. No adnexal masses. Other:  There is a small amount of high attenuation perihepatic and pelvic ascites. No free air. Musculoskeletal: Superior endplate compression deformities of T11, L1, L2, and L3, age indeterminate. No definite acute fracture or worrisome bone lesion. IMPRESSION: 1. Small amount of perihepatic and pelvic ascites. Attenuation above simple fluid suggests blood, contrast material, or proteinaceous component. 2.  Stable placement of patent metallic biliary stent. 3. Fecal distention of the rectum with regional inflammatory/edematous changes, suggesting possible stercoral proctitis. 4. Sigmoid diverticulosis. 5. Lower thoracic and lumbar mild compression deformities, age indeterminate 6. Ectatic abdominal aorta at risk for aneurysm development. Recommend followup by ultrasound in 5 years. This recommendation follows ACR consensus guidelines: White Paper of the ACR Incidental Findings Committee II on Vascular Findings. J Am Coll Radiol 2013; 10:789-794. Electronically Signed   By: Lucrezia Europe M.D.   On: 09/24/2017 12:07   Dg Chest 1 View  Result Date: 09/30/2017 CLINICAL DATA:  Hypoxia EXAM: CHEST  1 VIEW COMPARISON:  09/29/2017 FINDINGS: Cardiac shadow is stable. Aortic calcifications are again seen. Feeding catheter is noted coiled within the stomach stable from the prior exam. The lungs are well aerated bilaterally. Mild interstitial changes are seen with persistent right basilar atelectasis and small effusion. No bony abnormality is noted. IMPRESSION: Stable appearance of the chest from the previous day. Electronically Signed   By: Inez Catalina M.D.   On: 09/30/2017 08:20   Dg Chest 1 View  Result Date: 09/29/2017 CLINICAL DATA:  Hypoxia, shortness of breath, and weakness. Limited ability to give history. History of lymphoma, uterine malignancy, diabetes, and previous CVA. Former smoker. EXAM: CHEST  1 VIEW COMPARISON:  Portable chest x-ray of September 25, 2017 FINDINGS: The lungs are well-expanded. There is increased  density at the right lung base with partial obscuration of the hemidiaphragm. A small right pleural effusion is suspected. The left lung exhibits no infiltrate or effusion. The interstitial markings of both lungs are coarse. The heart and pulmonary vascularity are normal. A feeding tube is present with the radiodense tube tip projecting in the region of the gastric cardia with some coiling of the tube more inferiorly in the gastric body. IMPRESSION: Worsening atelectasis or pneumonia at the right lung base with small right pleural effusion. Underlying COPD. No CHF. Electronically Signed   By: David  Martinique M.D.   On: 09/29/2017 14:22   Dg Abd 1 View  Result Date: 09/23/2017 CLINICAL DATA:  72 year old female NG tube placement. EXAM: ABDOMEN - 1 VIEW COMPARISON:  Portable chest 1310 hours today. FINDINGS: Portable AP semi upright view at 1930 hours. Enteric tube courses to the left upper quadrant, tip at the level of the gastric body. The side hole is at the level of the gastroesophageal junction and could be advanced 5 centimeters for more optimal placement. Stable lung bases. Negative visible bowel gas pattern. There is a midline metallic stent projecting over the lower lumbar spine. No acute osseous abnormality identified. IMPRESSION: NG tube tip in the proximal stomach. Advance the tube 5 centimeters to ensure side hole placement inside the stomach. Electronically Signed   By: Genevie Ann M.D.   On: 09/23/2017 19:59   Ct Head Wo Contrast  Result Date: 09/23/2017 CLINICAL DATA:  Patient found unresponsive yesterday. EXAM: CT HEAD WITHOUT CONTRAST TECHNIQUE: Contiguous axial images were obtained from the base of the skull through the vertex without intravenous contrast. COMPARISON:  Brain MRI 08/21/2017. FINDINGS: Brain: No evidence of acute infarction, hemorrhage, hydrocephalus, extra-axial collection or mass lesion/mass effect. Cortical atrophy and chronic microvascular ischemic change are noted. Remote  lacunar infarction left caudate head is identified. Vascular: No hyperdense vessel or unexpected calcification. Skull: Intact.  No focal lesion. Sinuses/Orbits: There is fairly extensive ethmoid air cell disease and marked mucosal thickening in the left maxillary sinus. Mild mucosal thickening right sphenoid sinus is present. The left frontal  sinus is opacified. Other: None. IMPRESSION: No acute abnormality. Atrophy and chronic microvascular ischemic change. Sinus disease. Electronically Signed   By: Inge Rise M.D.   On: 09/23/2017 14:30   Ct Angio Chest Pe W Or Wo Contrast  Result Date: 09/23/2017 CLINICAL DATA:  Acute respiratory failure. Recent exploratory laparotomy and cholecystectomy. History of lymphoma and uterine cancer. EXAM: CT ANGIOGRAPHY CHEST WITH CONTRAST TECHNIQUE: Multidetector CT imaging of the chest was performed using the standard protocol during bolus administration of intravenous contrast. Multiplanar CT image reconstructions and MIPs were obtained to evaluate the vascular anatomy. CONTRAST:  6mL ISOVUE-370 IOPAMIDOL (ISOVUE-370) INJECTION 76% COMPARISON:  Chest radiograph September 23, 2017 FINDINGS: CARDIOVASCULAR: Adequate contrast opacification of the pulmonary artery's. Main pulmonary artery is not enlarged. No pulmonary arterial filling defects to the level of the subsegmental branches. Heart size is normal, no right heart strain. Mild coronary artery calcifications. Trace pericardial effusion. Thoracic aorta is normal course and caliber, mild calcific atherosclerosis. MEDIASTINUM/NODES: No lymphadenopathy by CT size criteria. LUNGS/PLEURA: Moderate RIGHT and small LEFT pleural effusions. Bilateral lower lobe compressive atelectasis versus pneumonia. Scattered clusters of bilateral tree-in-bud infiltrates. Bronchial wall thickening. Endotracheal tube tip above the carina. Mild centrilobular emphysema. No pneumothorax. UPPER ABDOMEN: Partially imaged biliary drain with pneumobilia  and debris within surgical bed. MUSCULOSKELETAL: Thickened RIGHT platysma and subcutaneous fat stranding incompletely imaged. Mild T11, mild L1 and mild-to-moderate L2 old compression fractures. Mild spondylosis. Review of the MIP images confirms the above findings. IMPRESSION: 1. Moderate RIGHT and small LEFT pleural effusions. Bibasilar atelectasis and/or pneumonia. 2. Bronchial wall thickening seen with bronchitis. Scattered tree-in-bud infiltrates may be infectious or inflammatory. 3. Partially imaged biliary stent with pneumobilia and debris within gallbladder fossa. Aortic Atherosclerosis (ICD10-I70.0) and Emphysema (ICD10-J43.9). Electronically Signed   By: Elon Alas M.D.   On: 09/23/2017 22:10   Dg Chest Port 1 View  Result Date: 09/25/2017 CLINICAL DATA:  Respiratory failure. EXAM: PORTABLE CHEST 1 VIEW COMPARISON:  09/24/2017, 09/23/2017.  CT 09/23/2017. FINDINGS: Endotracheal tube and NG tube in stable position. Heart size normal. Mild bibasilar atelectasis. Interim improvement of previously identified small bilateral pleural effusions. No pneumothorax. IMPRESSION: 1.  Lines and tubes in stable position. 2. Mild bibasilar atelectasis. Interim improvement of previously identified small bilateral pleural effusions. Electronically Signed   By: Marcello Moores  Register   On: 09/25/2017 07:14   Dg Chest Port 1 View  Result Date: 09/24/2017 CLINICAL DATA:  Acute respiratory failure EXAM: PORTABLE CHEST 1 VIEW COMPARISON:  Yesterday FINDINGS: Endotracheal tube tip between the clavicular heads and carina. An orogastric tube reaches the stomach. Artifact from EKG leads. Layering pleural effusions with atelectasis. There is hyperinflation. Normal heart size. IMPRESSION: 1. Unremarkable positioning of endotracheal and orogastric tubes. 2. Small bilateral layering pleural effusion. 3. Probable COPD. Electronically Signed   By: Monte Fantasia M.D.   On: 09/24/2017 07:55   Dg Chest Port 1 View  Result  Date: 09/23/2017 CLINICAL DATA:  Tube placement. EXAM: PORTABLE CHEST 1 VIEW COMPARISON:  Radiographs of April 10, 2014. FINDINGS: Stable cardiomediastinal silhouette. Interval placement of endotracheal tube with distal tip 6 cm above the carina. No pneumothorax is noted. Mild bibasilar subsegmental atelectasis or edema is noted with small pleural effusions. Bony thorax is unremarkable. IMPRESSION: Endotracheal tube in grossly good position. Nasogastric tube is seen entering stomach. Mild bibasilar subsegmental atelectasis or edema is noted with small pleural effusions. Electronically Signed   By: Marijo Conception, M.D.   On: 09/23/2017 13:55   Dg  Abd Portable 1v  Result Date: 09/25/2017 CLINICAL DATA:  Encounter for nasogastric tube placement EXAM: PORTABLE ABDOMEN - 1 VIEW COMPARISON:  Chest x-ray from earlier today FINDINGS: A nasogastric tube has been replaced by a feeding tube with tip at the fundus. Haziness at the bases from atelectasis and pleural fluid based on prior abdominal CT. Metallic biliary stent in place. Previously seen oral contrast has reached the colon. IMPRESSION: When compared to chest x-ray earlier today the orogastric tube has been replaced by a feeding tube with tip at the gastric fundus. Electronically Signed   By: Monte Fantasia M.D.   On: 09/25/2017 13:22    Wt Readings from Last 3 Encounters:  10/02/17 63.3 kg (139 lb 9.6 oz)  08/09/17 54.4 kg (120 lb)  09/28/15 55.8 kg (123 lb)    EKG: Atrial fibrillation.  No ischemia.  Physical Exam:  Blood pressure 101/75, pulse (!) 113, temperature 98.1 F (36.7 C), temperature source Oral, resp. rate 14, height 5\' 5"  (1.651 m), weight 63.3 kg (139 lb 9.6 oz), SpO2 97 %. Body mass index is 23.23 kg/m. General: Well developed, well nourished, in no acute distress. Head: Normocephalic, atraumatic, sclera non-icteric, no xanthomas, nares are without discharge.  Neck: Negative for carotid bruits. JVD not elevated. Lungs: Clear  bilaterally to auscultation without wheezes, rales, or rhonchi. Breathing is unlabored. Heart: Irregular irregular rhythm no murmurs, rubs, or gallops appreciated. Abdomen: Soft, non-tender, non-distended with normoactive bowel sounds. No hepatomegaly. No rebound/guarding. No obvious abdominal masses. Msk:  Strength and tone appear normal for age. Extremities: No clubbing or cyanosis. No edema.  Distal pedal pulses are 2+ and equal bilaterally. Neuro: Lethargic but responds to verbal commands appropriately.     Assessment and Plan  72 year old female with history of chronic A. fib previously treated with metoprolol for rate control and Eliquis for anticoagulation status post prolonged hospitalization at [facility] with a complicated ERCP and currently admitted to [facility] with urinary tract infection, sepsis and respiratory failure requiring intubation.  She is now extubated.  She is not taking p.o. intake very well and has an NG tube in place.  She is relatively hypotensive with atrial fibrillation with fairly rapid ventricular response.  Echocardiogram done at [facility] earlier this month revealed preserved LV function with no significant valvular abnormalities.  Will attempt to improve rate control by carefully increasing the Cardizem to 60 mg every 6 hours.  Blood pressure may not allow this.  Will also consider careful hydration given the fact she has preserved LV function.  Would continue to defer chronic any coagulation at present until further assessment can be completed as bleeding risk appears to exceed benefit.  We will continue with empiric antibiotics.  We will follow with you.  Signed, Teodoro Spray MD 10/02/2017, 12:36 PM Pager: [phone]

## 2017-10-02 NOTE — Progress Notes (Addendum)
Occupational Therapy Treatment Patient Details Name: Nina Hudson MRN: [id number] DOB: November 08, 1945 Today's Date: 10/02/2017    History of present illness Pt. is a 72 y.o. female pt presented to ER secondary to SOB, weakness, lethargy, noted with guaic positive stools; admitted with acute/chronic respiratory failure secondary to R LL PNA (requiring intubation 6/19-6/21), now on 2L supplemental O2. Of note, patient with recent hospitalization at outside hospital (6/2-6/14) due to acute cholecystitis/choledocholethiasis, status post ERCP with stent and stone removal and laprascopic cholecystectomy.  Course complicated by cystic artery bleed requiring exploratory laparotomy.  Discharge to STR due to significant weakness/deconditioning after extended hospitalization.   OT comments  Pt.'s daughter was present with the pt. Pt. was lethargic, and kept eyes closed throughout the session. Pt. Open eyes in response to requests, however did not sustain them open. Pt. Verbalized response to questions, however did not initiate conversation. With encouragement pt. participated in, and tolerated gentle ROM to the BUEs. Pt.'s daughter was present during the session. Pt. could benefit from OT services for ADL training, ROM, positioning, A/E training, and pt./family education about energy conservation, work simplification strategies, home modification, and DME. Pt. Continues to benefit from SNF level of care upon discharge with follow-up OT services.   Follow Up Recommendations  SNF    Equipment Recommendations  3 in 1 bedside commode    Recommendations for Other Services      Precautions / Restrictions Restrictions Weight Bearing Restrictions: No       Mobility Bed Mobility      deferred            Transfers    deferred                  Balance                                           ADL either performed or assessed with clinical judgement   ADL Overall ADL's  : Needs assistance/impaired Eating/Feeding: NPO;Set up Eating/Feeding Details (indicate cue type and reason): G tube Grooming: Maximal assistance;Bed level;Set up   Upper Body Bathing: Maximal assistance;Bed level;Set up   Lower Body Bathing: Maximal assistance;Bed level;Set up   Upper Body Dressing : Set up;Maximal assistance;Bed level   Lower Body Dressing: Set up;Bed level       Toileting- Clothing Manipulation and Hygiene: Total assistance               Vision Baseline Vision/History: Wears glasses Wears Glasses: At all times Patient Visual Report: No change from baseline     Perception     Praxis      Cognition Arousal/Alertness: Lethargic Behavior During Therapy: WFL for tasks assessed/performed Overall Cognitive Status: Within Functional Limits for tasks assessed                                 General Comments: Pt. kept eyes closed throughout session. Opens her eyes upon request.         Exercises     Shoulder Instructions       General Comments      Pertinent Vitals/ Pain       Pain Assessment: No/denies pain  Home Living  Prior Functioning/Environment              Frequency  Min 1X/week        Progress Toward Goals  OT Goals(current goals can now be found in the care plan section)  Progress towards OT goals: OT to reassess next treatment  Acute Rehab OT Goals Patient Stated Goal: To return home to her dog. OT Goal Formulation: With patient Potential to Achieve Goals: Good  Plan      Co-evaluation                 AM-PAC PT "6 Clicks" Daily Activity     Outcome Measure   Help from another person eating meals?: Total Help from another person taking care of personal grooming?: A Lot Help from another person toileting, which includes using toliet, bedpan, or urinal?: Total Help from another person bathing (including washing, rinsing, drying)?: A  Lot Help from another person to put on and taking off regular upper body clothing?: A Lot Help from another person to put on and taking off regular lower body clothing?: A Lot 6 Click Score: 10    End of Session    OT Visit Diagnosis: Other abnormalities of gait and mobility (R26.89);Muscle weakness (generalized) (M62.81)   Activity Tolerance Patient limited by fatigue   Patient Left in bed;with call bell/phone within reach;with bed alarm set   Nurse Communication          Time: 0881-1031 OT Time Calculation (min): 23 min  Charges: OT General Charges $OT Visit: 1 Visit OT Treatments $Self Care/Home Management : 23-37 mins  Harrel Carina, MS, OTR/L  Harrel Carina 10/02/2017, 12:20 PM

## 2017-10-02 NOTE — Progress Notes (Signed)
Talked to Dr. Ubaldo Glassing about patient's cardizem p.o scheduled at 1800, and HR still in 130, just give digoxin, per MD ok to give cardizem p.o if SBP is greater than 90. No other concern at the moment. RN will continue to monitor.

## 2017-10-02 NOTE — Progress Notes (Signed)
Talked to Dr. Jerelyn Charles about patient's HR still sustaining at 120's-130's. Patient received digoxin 0.25 mg at 1624 and received oral cardizem 60 mg at 1759, order to give digoxin 0.5 mg IV. Also patient requesting sleeping aide, order for trazadone given. RN will continue to monitor.

## 2017-10-03 LAB — GLUCOSE, CAPILLARY
GLUCOSE-CAPILLARY: 235 mg/dL — AB (ref 70–99)
Glucose-Capillary: 189 mg/dL — ABNORMAL HIGH (ref 70–99)
Glucose-Capillary: 230 mg/dL — ABNORMAL HIGH (ref 70–99)
Glucose-Capillary: 236 mg/dL — ABNORMAL HIGH (ref 70–99)

## 2017-10-03 MED ORDER — NYSTATIN 100000 UNIT/ML MT SUSP
5.0000 mL | Freq: Four times a day (QID) | OROMUCOSAL | Status: DC
  Administered 2017-10-03 – 2017-10-06 (×15): 500000 [IU] via OROMUCOSAL
  Filled 2017-10-03 (×14): qty 5

## 2017-10-03 NOTE — Progress Notes (Signed)
[facility] NAME: Nina Hudson    MR#:  [id number]  DATE OF BIRTH:  April 14, 1945  SUBJECTIVE:   Pt is more awake today and diet has been advanced.  Patient is eating a bit more.  Patient's family is at bedside and is encouraged by her progress.  REVIEW OF SYSTEMS:    Review of Systems  Constitutional: Negative for chills and fever.  HENT: Negative for congestion and tinnitus.   Eyes: Negative for blurred vision and double vision.  Respiratory: Negative for cough, shortness of breath and wheezing.   Cardiovascular: Negative for chest pain, orthopnea and PND.  Gastrointestinal: Negative for abdominal pain, diarrhea, nausea and vomiting.  Genitourinary: Negative for dysuria and hematuria.  Neurological: Positive for weakness (Globally weak). Negative for dizziness, sensory change and focal weakness.  All other systems reviewed and are negative.   Nutrition: Dysphagia 1 with thin liquids Tolerating Diet: Yes Tolerating PT: bedbound   DRUG ALLERGIES:   Allergies  Allergen Reactions  . Sulfa Antibiotics Other (See Comments)    "I get real achey" Other reaction(s): Muscle Pain, Other (See Comments)  General body aches andpain.     VITALS:  Blood pressure 115/66, pulse 79, temperature (!) 97.5 F (36.4 C), temperature source Oral, resp. rate 18, height 5\' 5"  (1.651 m), weight 64.5 kg (142 lb 4.8 oz), SpO2 97 %.  PHYSICAL EXAMINATION:   Physical Exam  GENERAL:  72 y.o.-year-old patient lying in bed in no acute distress.  EYES: Pupils equal, round, reactive to light and accommodation. No scleral icterus. Extraocular muscles intact.  HEENT: Head atraumatic, normocephalic. Oropharynx and nasopharynx clear.  NECK:  Supple, no jugular venous distention. No thyroid enlargement, no tenderness.  LUNGS: Normal breath sounds bilaterally, no wheezing, rales, rhonchi. No use of accessory muscles of respiration.  CARDIOVASCULAR: S1, S2  normal. No murmurs, rubs, or gallops.  ABDOMEN: Soft, nontender, nondistended. Bowel sounds present. No organomegaly or mass.  EXTREMITIES: No cyanosis, clubbing, + 1 dependent edema b/l.    NEUROLOGIC: Cranial nerves II through XII are intact. No focal Motor or sensory deficits b/l.  Globally weak.  PSYCHIATRIC: The patient is alert and oriented x 3.  SKIN: No obvious rash, lesion, or ulcer.    LABORATORY PANEL:   CBC Recent Labs  Lab 10/02/17 0738  WBC 9.6  HGB 11.1*  HCT 32.5*  PLT 220   ------------------------------------------------------------------------------------------------------------------  Chemistries  Recent Labs  Lab 10/02/17 0738  NA 139  K 4.4  CL 98  CO2 34*  GLUCOSE 212*  BUN 40*  CREATININE 0.83  CALCIUM 8.7*  MG 2.0   ------------------------------------------------------------------------------------------------------------------  Cardiac Enzymes No results for input(s): TROPONINI in the last 168 hours. ------------------------------------------------------------------------------------------------------------------  RADIOLOGY:  No results found.   ASSESSMENT AND PLAN:   Nina Hudson a23 y.o.femalewith a known history of diffuse large B cell lymphoma, uterine cancer, history of a fib on eliquis, diabetes, comes [facility] the emergency room from [facility] due to acute on chronic hypoxic respiratory failure secondary to suspected pneumonia/G.I. bleed/acute on chronic anemia.  1.acute on chronic hypoxic respiratory failure secondary to right lower lobe pneumonia -Patient was treated with IV Zosyn for pneumonia but antibiotics have been stopped now.  PE has been ruled out. - Clinically stable from the respiratory standpoint presently. - PE has been ruled out  2.Acute encephalopathy - metabolic in nature due to infection and much improved today and pt. Is more awake and alert.  - EEG (-) for  seizures but just generalized slowing.    3.acute on chronic anemia - patient was on eliquis and holding it at present - Status post 1 unit of transfusion - Hemoglobin stable and will cont. To monitor.   3.History of ERCP with stone removal and stent placement along with lap Chole complicated by cystic artery bleed requiring exploratory laparotomy - this was done at [facility] on 6/5 /2019. Currently stable.    4.Diabetes - cont. SSI.   - BS stable.   5.A. fib with RVR -rates are much better controlled.  Patient was somewhat hypotensive therefore metoprolol was discontinued. -Continue Cardizem, PRN digoxin..  6. Poor PO intake with malnutrition - pt. Is more awake today and PO intake is improving. Speech has advanced her diet and will cont. To monitor.  -no plans for PEG placement for now.    All the records are reviewed and case discussed with Care Management/Social Worker. Management plans discussed with the patient, family and they are in agreement.  CODE STATUS: Full code  DVT Prophylaxis: TED's & SCD's.   TOTAL TIME TAKING CARE OF THIS PATIENT: 30 minutes.   POSSIBLE D/C IN 2-3 DAYS, DEPENDING ON CLINICAL CONDITION.   Henreitta Leber M.D on 10/03/2017 at 12:59 PM  Between 7am to 6pm - Pager - [phone]  After 6pm go to [email or web address] - Proofreader  [facility]  Office  [phone]  CC: Primary care physician; Derinda Late, MD

## 2017-10-03 NOTE — Progress Notes (Signed)
Patient Name: Nina Hudson Date of Encounter: 10/03/2017  Hospital Problem List     Active Problems:   Acute respiratory failure with hypoxia (HCC)   Pressure injury of skin   Goals of care, counseling/discussion   Palliative care by specialist    Patient Profile     72 year old female with history of diffuse large B-cell lymphoma, uterine carcinoma, history of chronic atrial fibrillation previously anticoagulated with Eliquis, diabetes who was admitted after developing urinary tract infection and sepsis and respiratory therapy.  She had a prolonged hospitalization at [facility] for complications following ERCP.  She was treated for atrial fibrillation rapid ventricular response.  She was taken off of Eliquis due to bleeding.  Subjective   Rate is improved after p.o. Cardizem and IV dose of digoxin.  This morning her atrial fibrillation is rate controlled.  Inpatient Medications    . budesonide (PULMICORT) nebulizer solution  0.5 mg Nebulization BID  . diltiazem  60 mg Oral Q6H  . glipiZIDE  5 mg Oral QAC breakfast  . insulin aspart  0-15 Units Subcutaneous TID AC & HS  . ipratropium-albuterol  3 mL Nebulization Q6H  . mouth rinse  15 mL Mouth Rinse BID  . methylPREDNISolone (SOLU-MEDROL) injection  40 mg Intravenous Daily  . multivitamin with minerals  1 tablet Oral Daily  . nystatin  5 mL Mouth/Throat QID  . pantoprazole (PROTONIX) IV  40 mg Intravenous Q12H  . traZODone  50 mg Oral QHS    Vital Signs    Vitals:   10/03/17 0217 10/03/17 0416 10/03/17 0725 10/03/17 0748  BP:  103/60 111/64   Pulse:  69 70   Resp:  18 18   Temp:  97.8 F (36.6 C) (!) 97.5 F (36.4 C)   TempSrc:   Oral   SpO2: 95% 100% 99% 97%  Weight:  64.5 kg (142 lb 4.8 oz)    Height:        Intake/Output Summary (Last 24 hours) at 10/03/2017 0906 Last data filed at 10/02/2017 2202 Gross per 24 hour  Intake 240 ml  Output 1300 ml  Net -1060 ml   Filed Weights   10/01/17 0428  10/02/17 0358 10/03/17 0416  Weight: 63.1 kg (139 lb 3.2 oz) 63.3 kg (139 lb 9.6 oz) 64.5 kg (142 lb 4.8 oz)    Physical Exam    GEN: Well nourished, well developed, in no acute distress.  HEENT: normal.  Neck: Supple, no JVD, carotid bruits, or masses. Cardiac: Irregular irregular rhythm, no murmurs, rubs, or gallops. No clubbing, cyanosis, edema.  Radials/DP/PT 2+ and equal bilaterally.  Respiratory:  Respirations regular and unlabored, clear to auscultation bilaterally. GI: Soft, nontender, nondistended, BS + x 4. MS: no deformity or atrophy. Skin: warm and dry, no rash. Neuro:  Strength and sensation are intact. Psych: Normal affect.  Labs    CBC Recent Labs    10/02/17 0738  WBC 9.6  HGB 11.1*  HCT 32.5*  MCV 92.9  PLT 191   Basic Metabolic Panel Recent Labs    10/02/17 0738  NA 139  K 4.4  CL 98  CO2 34*  GLUCOSE 212*  BUN 40*  CREATININE 0.83  CALCIUM 8.7*  MG 2.0  PHOS 3.5   Liver Function Tests No results for input(s): AST, ALT, ALKPHOS, BILITOT, PROT, ALBUMIN in the last 72 hours. No results for input(s): LIPASE, AMYLASE in the last 72 hours. Cardiac Enzymes No results for input(s): CKTOTAL, CKMB, CKMBINDEX, TROPONINI  in the last 72 hours. BNP No results for input(s): BNP in the last 72 hours. D-Dimer No results for input(s): DDIMER in the last 72 hours. Hemoglobin A1C No results for input(s): HGBA1C in the last 72 hours. Fasting Lipid Panel No results for input(s): CHOL, HDL, LDLCALC, TRIG, CHOLHDL, LDLDIRECT in the last 72 hours. Thyroid Function Tests No results for input(s): TSH, T4TOTAL, T3FREE, THYROIDAB in the last 72 hours.  Invalid input(s): FREET3  Telemetry    Atrial fibrillation with variable ventricular response, currently controlled ventricular response.  ECG    Atrial fibrillation  Radiology    Ct Abdomen Pelvis Wo Contrast  Result Date: 09/24/2017 CLINICAL DATA:  S/P ERCP and stent for stone removal. Now with  persistent lactic acidosis. PO contrast only pe chest done yesterday. EXAM: CT ABDOMEN AND PELVIS WITHOUT CONTRAST TECHNIQUE: Multidetector CT imaging of the abdomen and pelvis was performed following the standard protocol without IV contrast. COMPARISON:  CT chest from previous day, PET-CT 11/16/2012, and earlier studies FINDINGS: Lower chest: Bilateral pleural effusions right greater than left as before. Adjacent consolidation/atelectasis posteriorly in the lower lobes. Scattered subpleural patchy somewhat nodular opacities in the visualized lung bases as before. Hepatobiliary: Metallic CBD stent. Gas in the intrahepatic biliary tree implying stent patency. Cholecystectomy clips. No discrete liver lesion. Pancreas: Scattered coarse calcifications in the pancreatic head near the stent. No mass or ductal dilatation. Spleen: Normal in size without focal abnormality. Adrenals/Urinary Tract: Unremarkable adrenals. Residual contrast in the renal collecting systems without hydronephrosis. No focal renal lesion is evident. Foley catheter partially decompresses the urinary bladder. Stomach/Bowel: Stomach is nondilated. Incomplete distal passage of oral contrast material. Small bowel grossly unremarkable. Multiple sigmoid diverticula. Fecal material distends the rectum. There are mild perirectal and presacral edematous/inflammatory changes. Vascular/Lymphatic: Aortoiliac atherosclerosis (ICD10-170.0). Infrarenal aorta 2.6 cm diameter. Tortuous venous collateral channels in the left retroperitoneum associated with the left renal vein, present on studies dating back to 05/22/2007. No abdominal or pelvic adenopathy localized. Reproductive: Status post hysterectomy. No adnexal masses. Other: There is a small amount of high attenuation perihepatic and pelvic ascites. No free air. Musculoskeletal: Superior endplate compression deformities of T11, L1, L2, and L3, age indeterminate. No definite acute fracture or worrisome bone  lesion. IMPRESSION: 1. Small amount of perihepatic and pelvic ascites. Attenuation above simple fluid suggests blood, contrast material, or proteinaceous component. 2. Stable placement of patent metallic biliary stent. 3. Fecal distention of the rectum with regional inflammatory/edematous changes, suggesting possible stercoral proctitis. 4. Sigmoid diverticulosis. 5. Lower thoracic and lumbar mild compression deformities, age indeterminate 6. Ectatic abdominal aorta at risk for aneurysm development. Recommend followup by ultrasound in 5 years. This recommendation follows ACR consensus guidelines: White Paper of the ACR Incidental Findings Committee II on Vascular Findings. J Am Coll Radiol 2013; 10:789-794. Electronically Signed   By: Lucrezia Europe M.D.   On: 09/24/2017 12:07   Dg Chest 1 View  Result Date: 09/30/2017 CLINICAL DATA:  Hypoxia EXAM: CHEST  1 VIEW COMPARISON:  09/29/2017 FINDINGS: Cardiac shadow is stable. Aortic calcifications are again seen. Feeding catheter is noted coiled within the stomach stable from the prior exam. The lungs are well aerated bilaterally. Mild interstitial changes are seen with persistent right basilar atelectasis and small effusion. No bony abnormality is noted. IMPRESSION: Stable appearance of the chest from the previous day. Electronically Signed   By: Inez Catalina M.D.   On: 09/30/2017 08:20   Dg Chest 1 View  Result Date: 09/29/2017 CLINICAL DATA:  Hypoxia, shortness of breath, and weakness. Limited ability to give history. History of lymphoma, uterine malignancy, diabetes, and previous CVA. Former smoker. EXAM: CHEST  1 VIEW COMPARISON:  Portable chest x-ray of September 25, 2017 FINDINGS: The lungs are well-expanded. There is increased density at the right lung base with partial obscuration of the hemidiaphragm. A small right pleural effusion is suspected. The left lung exhibits no infiltrate or effusion. The interstitial markings of both lungs are coarse. The heart and  pulmonary vascularity are normal. A feeding tube is present with the radiodense tube tip projecting in the region of the gastric cardia with some coiling of the tube more inferiorly in the gastric body. IMPRESSION: Worsening atelectasis or pneumonia at the right lung base with small right pleural effusion. Underlying COPD. No CHF. Electronically Signed   By: David  Martinique M.D.   On: 09/29/2017 14:22   Dg Abd 1 View  Result Date: 09/23/2017 CLINICAL DATA:  72 year old female NG tube placement. EXAM: ABDOMEN - 1 VIEW COMPARISON:  Portable chest 1310 hours today. FINDINGS: Portable AP semi upright view at 1930 hours. Enteric tube courses to the left upper quadrant, tip at the level of the gastric body. The side hole is at the level of the gastroesophageal junction and could be advanced 5 centimeters for more optimal placement. Stable lung bases. Negative visible bowel gas pattern. There is a midline metallic stent projecting over the lower lumbar spine. No acute osseous abnormality identified. IMPRESSION: NG tube tip in the proximal stomach. Advance the tube 5 centimeters to ensure side hole placement inside the stomach. Electronically Signed   By: Genevie Ann M.D.   On: 09/23/2017 19:59   Ct Head Wo Contrast  Result Date: 09/23/2017 CLINICAL DATA:  Patient found unresponsive yesterday. EXAM: CT HEAD WITHOUT CONTRAST TECHNIQUE: Contiguous axial images were obtained from the base of the skull through the vertex without intravenous contrast. COMPARISON:  Brain MRI 08/21/2017. FINDINGS: Brain: No evidence of acute infarction, hemorrhage, hydrocephalus, extra-axial collection or mass lesion/mass effect. Cortical atrophy and chronic microvascular ischemic change are noted. Remote lacunar infarction left caudate head is identified. Vascular: No hyperdense vessel or unexpected calcification. Skull: Intact.  No focal lesion. Sinuses/Orbits: There is fairly extensive ethmoid air cell disease and marked mucosal thickening in  the left maxillary sinus. Mild mucosal thickening right sphenoid sinus is present. The left frontal sinus is opacified. Other: None. IMPRESSION: No acute abnormality. Atrophy and chronic microvascular ischemic change. Sinus disease. Electronically Signed   By: Inge Rise M.D.   On: 09/23/2017 14:30   Ct Angio Chest Pe W Or Wo Contrast  Result Date: 09/23/2017 CLINICAL DATA:  Acute respiratory failure. Recent exploratory laparotomy and cholecystectomy. History of lymphoma and uterine cancer. EXAM: CT ANGIOGRAPHY CHEST WITH CONTRAST TECHNIQUE: Multidetector CT imaging of the chest was performed using the standard protocol during bolus administration of intravenous contrast. Multiplanar CT image reconstructions and MIPs were obtained to evaluate the vascular anatomy. CONTRAST:  35mL ISOVUE-370 IOPAMIDOL (ISOVUE-370) INJECTION 76% COMPARISON:  Chest radiograph September 23, 2017 FINDINGS: CARDIOVASCULAR: Adequate contrast opacification of the pulmonary artery's. Main pulmonary artery is not enlarged. No pulmonary arterial filling defects to the level of the subsegmental branches. Heart size is normal, no right heart strain. Mild coronary artery calcifications. Trace pericardial effusion. Thoracic aorta is normal course and caliber, mild calcific atherosclerosis. MEDIASTINUM/NODES: No lymphadenopathy by CT size criteria. LUNGS/PLEURA: Moderate RIGHT and small LEFT pleural effusions. Bilateral lower lobe compressive atelectasis versus pneumonia. Scattered clusters of bilateral tree-in-bud  infiltrates. Bronchial wall thickening. Endotracheal tube tip above the carina. Mild centrilobular emphysema. No pneumothorax. UPPER ABDOMEN: Partially imaged biliary drain with pneumobilia and debris within surgical bed. MUSCULOSKELETAL: Thickened RIGHT platysma and subcutaneous fat stranding incompletely imaged. Mild T11, mild L1 and mild-to-moderate L2 old compression fractures. Mild spondylosis. Review of the MIP images confirms  the above findings. IMPRESSION: 1. Moderate RIGHT and small LEFT pleural effusions. Bibasilar atelectasis and/or pneumonia. 2. Bronchial wall thickening seen with bronchitis. Scattered tree-in-bud infiltrates may be infectious or inflammatory. 3. Partially imaged biliary stent with pneumobilia and debris within gallbladder fossa. Aortic Atherosclerosis (ICD10-I70.0) and Emphysema (ICD10-J43.9). Electronically Signed   By: Elon Alas M.D.   On: 09/23/2017 22:10   Dg Chest Port 1 View  Result Date: 09/25/2017 CLINICAL DATA:  Respiratory failure. EXAM: PORTABLE CHEST 1 VIEW COMPARISON:  09/24/2017, 09/23/2017.  CT 09/23/2017. FINDINGS: Endotracheal tube and NG tube in stable position. Heart size normal. Mild bibasilar atelectasis. Interim improvement of previously identified small bilateral pleural effusions. No pneumothorax. IMPRESSION: 1.  Lines and tubes in stable position. 2. Mild bibasilar atelectasis. Interim improvement of previously identified small bilateral pleural effusions. Electronically Signed   By: Marcello Moores  Register   On: 09/25/2017 07:14   Dg Chest Port 1 View  Result Date: 09/24/2017 CLINICAL DATA:  Acute respiratory failure EXAM: PORTABLE CHEST 1 VIEW COMPARISON:  Yesterday FINDINGS: Endotracheal tube tip between the clavicular heads and carina. An orogastric tube reaches the stomach. Artifact from EKG leads. Layering pleural effusions with atelectasis. There is hyperinflation. Normal heart size. IMPRESSION: 1. Unremarkable positioning of endotracheal and orogastric tubes. 2. Small bilateral layering pleural effusion. 3. Probable COPD. Electronically Signed   By: Monte Fantasia M.D.   On: 09/24/2017 07:55   Dg Chest Port 1 View  Result Date: 09/23/2017 CLINICAL DATA:  Tube placement. EXAM: PORTABLE CHEST 1 VIEW COMPARISON:  Radiographs of April 10, 2014. FINDINGS: Stable cardiomediastinal silhouette. Interval placement of endotracheal tube with distal tip 6 cm above the carina. No  pneumothorax is noted. Mild bibasilar subsegmental atelectasis or edema is noted with small pleural effusions. Bony thorax is unremarkable. IMPRESSION: Endotracheal tube in grossly good position. Nasogastric tube is seen entering stomach. Mild bibasilar subsegmental atelectasis or edema is noted with small pleural effusions. Electronically Signed   By: Marijo Conception, M.D.   On: 09/23/2017 13:55   Dg Abd Portable 1v  Result Date: 09/25/2017 CLINICAL DATA:  Encounter for nasogastric tube placement EXAM: PORTABLE ABDOMEN - 1 VIEW COMPARISON:  Chest x-ray from earlier today FINDINGS: A nasogastric tube has been replaced by a feeding tube with tip at the fundus. Haziness at the bases from atelectasis and pleural fluid based on prior abdominal CT. Metallic biliary stent in place. Previously seen oral contrast has reached the colon. IMPRESSION: When compared to chest x-ray earlier today the orogastric tube has been replaced by a feeding tube with tip at the gastric fundus. Electronically Signed   By: Monte Fantasia M.D.   On: 09/25/2017 13:22    Assessment & Plan    72 year old female with diffuse large B cell lymphoma as well as history of uterine carcinoma with chronic A. fib diabetes and now urosepsis.  She had respiratory failure.  Currently is doing better.  Alert and oriented this morning.  Had her NG tube removed.  Being evaluated by speech pathology.  Atrial fibrillation rate is improved.  We will continue with Cardizem at 60 mg every 6 as pressure tolerates.  Would try to keep  her systolic pressure greater than 90.  As needed Lanoxin for control as needed.  Need to reconsider starting Eliquis when bleeding risk improves.  Signed, Javier Docker Laticha Ferrucci MD 10/03/2017, 9:06 AM  Pager: [phone]

## 2017-10-03 NOTE — Progress Notes (Signed)
Speech Language Pathology Treatment: Dysphagia  Patient Details Name: Nina Hudson MRN: [id number] DOB: [birth year] Today's Date: 10/03/2017 Time: 0830-0930 SLP Time Calculation (min) (ACUTE ONLY): 60 min  Assessment / Plan / Recommendation Clinical Impression  Pt seen for ongoing assessment of toleration of oral intake; trials of thin liquids(consistency) in order to upgrade diet from Nectar consistency liquids. Pt has been tolerating the currently recommended dysphagia diet of level 1(puree w/ Nectar liquids ordered last evening post removal of the NG tube; dtr reported good intake at dinner meal last night w/ no swallowing problems. Pt continues to present w/ weakness but alert and follows through w/ tasks w/ min cues appropriately. Pt appeared stronger in her vocal quality and cough this morning.   Pt is awake/alert to verbally respond and follow basic commands w/ SLP. Pt helped to hold her own cup for drinking as instructed to. Pt positioned fully upright and given thin liquid trials VIA CUP. Pt consumed 9+ trials of water w/ no immediate, overt s/s of aspiration noted. Pt was instructed, and she followed through, w/ taking SMALL, SINGLE sips from the Cup - SLOWLY. Clear vocal quality and no decline in respiratory status noted during/post trials.  Thorough education given to Daughter, Son, and pt on following the aspiration precautions as given/posted - especially using a CUP for drinking (NO STRAWS). Recommended use of dysphagia drink cup or another cup that can restrict flow of volume of fluid to allow pt to have more control of the volume during the swallowing/intake if needed. Will upgrade diet to Thin liquids w/ the Puree foods for trial period this weekend(also allow pt to continue to take in increased foods/calories w/ less exertion of mastication/energy); aspiration precautions in place; monitoring at meals as pt is easily fatigued; Pills given IN PUREE. SLP services will f/u next 1-2 days  w/ toleration of diet; trials to upgrade food consistency of diet further. Daughter, Son, pt agreed. NSG/MD updated.  Of note, thorough education and instruction/model given on using the Incentive Spirometer as family/pt were unsure of how to use. Pt was able to demonstrate appropriate use w/ SLP. NSG updated.      HPI  Pt is a83 y.o.femalewith a known history of diffuse large B cell lymphoma, uterine cancer, history of a fib on eliquis, diabetes, comes to the emergency room from [facility] where she was admitted on 09/18/17. Patient is being admitted type for acute on chronic hypoxic respiratory failure secondary to suspected pneumonia/G.I. bleed/acute on chronic anemia. Patient was intubated after admission. Extubated w/ success; NG tube in place for nutrition support initially then removed yesterday to allow pt to have hunger cues/desire to eat orally hopefully. Palliative Care is following pt and family for goals of care.         SLP Plan  Continue with current plan of care - trials to upgrade food consistency when appopriate       Recommendations  Diet recommendations: Dysphagia 1 (puree);Thin liquid Liquids provided via: Cup;No straw Medication Administration: Whole meds with puree(or crushed as needed for easier, safer swallowing) Supervision: Patient able to self feed;Full supervision/cueing for compensatory strategies;Staff to assist with self feeding(d/t overall weakness) Compensations: Minimize environmental distractions;Slow rate;Small sips/bites;Lingual sweep for clearance of pocketing;Multiple dry swallows after each bite/sip;Follow solids with liquid Postural Changes and/or Swallow Maneuvers: Seated upright 90 degrees;Upright 30-60 min after meal                General recommendations: (Dietitian f/u) Oral Care Recommendations: Oral care BID;Staff/trained  caregiver to provide oral care;Patient independent with oral care Follow up Recommendations: (TBD) SLP Visit  Diagnosis: Dysphagia, oropharyngeal phase (R13.12) Plan: Continue with current plan of care       [place], [place], CCC-SLP Watson,Katherine 10/03/2017, 11:22 AM

## 2017-10-03 NOTE — Progress Notes (Signed)
Occupational Therapy Treatment Patient Details Name: Nina Hudson MRN: [id number] DOB: 24-Jul-1945 Today's Date: 10/03/2017    History of present illness Pt. is a 72 y.o. female pt presented to ER secondary to SOB, weakness, lethargy, noted with guaic positive stools; admitted with acute/chronic respiratory failure secondary to R LL PNA (requiring intubation 6/19-6/21), now on 2L supplemental O2. Of note, patient with recent hospitalization at outside hospital (6/2-6/14) due to acute cholecystitis/choledocholethiasis, status post ERCP with stent and stone removal and laprascopic cholecystectomy.  Course complicated by cystic artery bleed requiring exploratory laparotomy.  Discharge to STR due to significant weakness/deconditioning after extended hospitalization.   OT comments  Pt. was sitting at the EOB with PT upon arrival. Pt. required maxA x2 sit to supine. Pt. was assisted with, and required total assist with peri hygiene care after soiling linens following EOB sitting. Pt. was able to initiate, and assist with reaching for the rails during bed mobility, and rolling. Pt. was lethargic, and refused UE ROM during the second visit attempt. Pt. Continues to benefit from OT services for ADL training, UE there. Ex, A/E training, and pt. Education about home modification, and DME. Pt. would benefit from SNF level of care upon discharge. Pt. Could benefit from follow-up OT services at discharge.   Follow Up Recommendations  SNF    Equipment Recommendations  3 in 1 bedside commode    Recommendations for Other Services      Precautions / Restrictions Precautions Precautions: Fall Precaution Comments: NPO/NGT Restrictions Weight Bearing Restrictions: No       Mobility Bed Mobility Overal bed mobility: Needs Assistance Bed Mobility: Rolling Rolling: Mod assist   Supine to sit: Max assist Sit to supine: Max assist;+2 for physical assistance   General bed mobility comments: good effort  reachig for rails for care but unable to fully turn without assist  Transfers                 General transfer comment: unsafe/unable    Balance                         ADL either performed or assessed with clinical judgement   ADL Overall ADL's : Needs assistance/impaired     Grooming: Maximal assistance;Bed level;Set up   Upper Body Bathing: Maximal assistance;Bed level;Set up   Lower Body Bathing: Maximal assistance;Bed level;Set up   Upper Body Dressing : Set up;Maximal assistance;Bed level   Lower Body Dressing: Set up;Bed level       Toileting- Clothing Manipulation and Hygiene: Total assistance               Vision Baseline Vision/History: Wears glasses Wears Glasses: At all times Patient Visual Report: No change from baseline     Perception     Praxis      Cognition Arousal/Alertness: Awake/alert Behavior During Therapy: WFL for tasks assessed/performed Overall Cognitive Status: Within Functional Limits for tasks assessed                                          Exercises    Shoulder Instructions       General Comments      Pertinent Vitals/ Pain       Pain Assessment: No/denies pain Pain Score: 4  Faces Pain Scale: Hurts little more Pain Location: with movement begins moaning - general sorenss Pain  Descriptors / Indicators: Sore;Moaning Pain Intervention(s): Limited activity within patient's tolerance;Monitored during session  Home Living                                          Prior Functioning/Environment              Frequency  Min 1X/week        Progress Toward Goals  OT Goals(current goals can now be found in the care plan section)  Progress towards OT goals: Progressing toward goals  Acute Rehab OT Goals Patient Stated Goal: To return home to her dog. OT Goal Formulation: With patient Potential to Achieve Goals: Good  Plan Discharge plan remains appropriate     Co-evaluation                 AM-PAC PT "6 Clicks" Daily Activity     Outcome Measure   Help from another person eating meals?: Total Help from another person taking care of personal grooming?: A Lot Help from another person toileting, which includes using toliet, bedpan, or urinal?: Total Help from another person bathing (including washing, rinsing, drying)?: A Lot Help from another person to put on and taking off regular upper body clothing?: A Lot Help from another person to put on and taking off regular lower body clothing?: A Lot 6 Click Score: 10    End of Session    OT Visit Diagnosis: Other abnormalities of gait and mobility (R26.89);Muscle weakness (generalized) (M62.81)   Activity Tolerance Patient limited by fatigue   Patient Left in bed;with call bell/phone within reach;with bed alarm set   Nurse Communication          Time: [phone] OT Time Calculation (min): 10 min  Charges: OT General Charges $OT Visit: 1 Visit OT Treatments $Self Care/Home Management : 8-22 mins  Harrel Carina, MS, OTR/L  Harrel Carina 10/03/2017, 1:29 PM

## 2017-10-03 NOTE — Clinical Social Work Note (Signed)
CSW is following patient while the family determines goals of care (SNF vs hospice).   Santiago Bumpers, MSW, Latanya Presser [phone]

## 2017-10-03 NOTE — Progress Notes (Addendum)
Physical Therapy Treatment Patient Details Name: Nina Hudson MRN: [id number] DOB: [birth year] Today's Date: 10/03/2017    History of Present Illness Pt. is a 72 y.o. female pt presented to ER secondary to SOB, weakness, lethargy, noted with guaic positive stools; admitted with acute/chronic respiratory failure secondary to R LL PNA (requiring intubation 6/19-6/21), now on 2L supplemental O2. Of note, patient with recent hospitalization at outside hospital (6/2-6/14) due to acute cholecystitis/choledocholethiasis, status post ERCP with stent and stone removal and laprascopic cholecystectomy.  Course complicated by cystic artery bleed requiring exploratory laparotomy.  Discharge to STR due to significant weakness/deconditioning after extended hospitalization.    PT Comments    Pt alert this am and engaged in session.  Agrees to sit EOB.  To EOB with max a x 1.  Once up she was able to remain upright x 3 minutes with min/mod a x 1 for support and encouragement.  She fatigued quickly and was assisted to supine with max a x 2 and repositioned for comfort.  During repositioning it was noted that pt was inc of BM and urine (external catheter on but not wicking away off of the moisture).  Care was given as appropriate and pt was encouraged to assist with rolling left and right but she remains unable to fully turn without assist.  Pt fatigued with session.  Pt with overall improvement in activity tolerance today.  Good effort but remains with global weakness and deconditioning.  She is not safe for standing/transfers at this time.  Mechanical/Hoyer lift is appropriate at this time for OOB if needed.   Follow Up Recommendations  SNF     Equipment Recommendations       Recommendations for Other Services       Precautions / Restrictions Precautions Precautions: Fall Restrictions Weight Bearing Restrictions: No    Mobility  Bed Mobility Overal bed mobility: Needs Assistance Bed Mobility:  Rolling Rolling: Mod assist   Supine to sit: Max assist Sit to supine: Max assist;+2 for physical assistance   General bed mobility comments: good effort reachig for rails for care but unable to fully turn without assist  Transfers                 General transfer comment: unsafe/unable  Ambulation/Gait             General Gait Details: unsafe/unable   Stairs             Wheelchair Mobility    Modified Rankin (Stroke Patients Only)       Balance Overall balance assessment: Needs assistance Sitting-balance support: Feet supported;Single extremity supported Sitting balance-Leahy Scale: Poor Sitting balance - Comments: min/mod assist to maintain static sitting balance edge of bed; kyphotic posture Postural control: Posterior lean     Standing balance comment: unsafe/unable                            Cognition Arousal/Alertness: Awake/alert Behavior During Therapy: WFL for tasks assessed/performed Overall Cognitive Status: Within Functional Limits for tasks assessed                                        Exercises Other Exercises Other Exercises: Sat EOB x 3 minutes - limited due to fatigue Other Exercises: Pt inc of urine/BM.  rolling left/right for care    General Comments  Pertinent Vitals/Pain Pain Assessment: Faces Faces Pain Scale: Hurts little more Pain Location: with movement begins moaning - general sorenss Pain Descriptors / Indicators: Sore;Moaning Pain Intervention(s): Limited activity within patient's tolerance;Monitored during session    Home Living                      Prior Function            PT Goals (current goals can now be found in the care plan section) Progress towards PT goals: Progressing toward goals    Frequency           PT Plan Current plan remains appropriate    Co-evaluation              AM-PAC PT "6 Clicks" Daily Activity  Outcome Measure   Difficulty turning over in bed (including adjusting bedclothes, sheets and blankets)?: Unable Difficulty moving from lying on back to sitting on the side of the bed? : Unable Difficulty sitting down on and standing up from a chair with arms (e.g., wheelchair, bedside commode, etc,.)?: Unable Help needed moving to and from a bed to chair (including a wheelchair)?: Total Help needed walking in hospital room?: Total Help needed climbing 3-5 steps with a railing? : Total 6 Click Score: 6    End of Session Equipment Utilized During Treatment: Oxygen Activity Tolerance: Patient limited by fatigue Patient left: in bed;with call bell/phone within reach;with bed alarm set;with family/visitor present         Time: [id number] PT Time Calculation (min) (ACUTE ONLY): 23 min  Charges:  $Therapeutic Activity: 23-37 mins                    G Codes:       Chesley Noon, PTA 10/03/17, 10:31 AM

## 2017-10-04 LAB — GLUCOSE, CAPILLARY
GLUCOSE-CAPILLARY: 175 mg/dL — AB (ref 70–99)
GLUCOSE-CAPILLARY: 182 mg/dL — AB (ref 70–99)
GLUCOSE-CAPILLARY: 243 mg/dL — AB (ref 70–99)
Glucose-Capillary: 234 mg/dL — ABNORMAL HIGH (ref 70–99)

## 2017-10-04 LAB — BASIC METABOLIC PANEL
Anion gap: 7 (ref 5–15)
BUN: 29 mg/dL — ABNORMAL HIGH (ref 8–23)
CALCIUM: 9.2 mg/dL (ref 8.9–10.3)
CHLORIDE: 98 mmol/L (ref 98–111)
CO2: 36 mmol/L — ABNORMAL HIGH (ref 22–32)
CREATININE: 0.67 mg/dL (ref 0.44–1.00)
GFR calc Af Amer: >60 mL/min (ref >60)
GFR calc non Af Amer: >60 mL/min (ref >60)
Glucose, Bld: 207 mg/dL — ABNORMAL HIGH (ref 70–99)
Potassium: 4.5 mmol/L (ref 3.5–5.1)
SODIUM: 141 mmol/L (ref 135–145)

## 2017-10-04 LAB — MAGNESIUM: MAGNESIUM: 2 mg/dL (ref 1.7–2.4)

## 2017-10-04 LAB — PHOSPHORUS: Phosphorus: 2.9 mg/dL (ref 2.5–4.6)

## 2017-10-04 MED ORDER — PANTOPRAZOLE SODIUM 40 MG PO PACK
40.0000 mg | PACK | Freq: Two times a day (BID) | ORAL | Status: DC
Start: 2017-10-04 — End: 2017-10-06
  Administered 2017-10-04 – 2017-10-06 (×6): 40 mg via ORAL
  Filled 2017-10-04 (×6): qty 20

## 2017-10-04 MED ORDER — GUAIFENESIN ER 600 MG PO TB12
600.0000 mg | ORAL_TABLET | Freq: Two times a day (BID) | ORAL | Status: DC
  Administered 2017-10-04 – 2017-10-06 (×4): 600 mg via ORAL
  Filled 2017-10-04 (×5): qty 1

## 2017-10-04 NOTE — Progress Notes (Addendum)
Pt has a schedule cardizem 60 mg PO. BP was at 107/70 and HR 70. Page prime. Will continue to monitor.  Update: 0115: Doctor Jannifer Franklin ordered to hold it at this time. Will continue to monitor.  Update 0511: Doctor Marcille Blanco was notified about BP of 115/92 HR of 70 and that pt has schedule Cardizem 60 mg by mouth at 0600. Doctor Marcille Blanco ordered to administered. Will continue to monitor.

## 2017-10-04 NOTE — Progress Notes (Signed)
Pharmacy Electrolyte Monitoring Consult:  Pharmacy consulted to assist in monitoring and replacing electrolytes in this 72 y.o. female admitted on 09/23/2017 with PNA  Labs:  Sodium (mmol/L)  Date Value  10/04/2017 141  04/11/2014 138   Potassium (mmol/L)  Date Value  10/04/2017 4.5  04/11/2014 3.9   Magnesium (mg/dL)  Date Value  10/04/2017 2.0  06/07/2012 1.8   Phosphorus (mg/dL)  Date Value  10/04/2017 2.9  01/16/2012 3.0   Calcium (mg/dL)  Date Value  10/04/2017 9.2   Calcium, Total (mg/dL)  Date Value  04/11/2014 8.1 (L)   Albumin (g/dL)  Date Value  09/25/2017 2.3 (L)  03/01/2013 3.4   Assessment/Plan: K, Mag, and Phos at goal. No supplementation at this time.  Will recheck in 2 days on 10/06/17. Note Dysphagia 1 diet, crushing meds.   Chinita Greenland PharmD Clinical Pharmacist 10/04/2017

## 2017-10-04 NOTE — Plan of Care (Signed)
  Problem: Education: Goal: Knowledge of General Education information will improve 10/04/2017 0617 by Liliane Channel, RN Outcome: Progressing 10/04/2017 0615 by Liliane Channel, RN Outcome: Progressing   Problem: Health Behavior/Discharge Planning: Goal: Ability to manage health-related needs will improve 10/04/2017 0617 by Liliane Channel, RN Outcome: Progressing 10/04/2017 0615 by Liliane Channel, RN Outcome: Progressing   Problem: Clinical Measurements: Goal: Ability to maintain clinical measurements within normal limits will improve Outcome: Progressing   Problem: Safety: Goal: Ability to remain free from injury will improve 10/04/2017 0617 by Liliane Channel, RN Outcome: Progressing 10/04/2017 0615 by Liliane Channel, RN Outcome: Progressing   Problem: Clinical Measurements: Goal: Respiratory complications will improve Outcome: Progressing

## 2017-10-04 NOTE — Plan of Care (Signed)
Rounded with MD, Patient has rested quietly today, sleeping mostly. Arouses easily to voice.  Denies pain. Complains of cough, mucinex ordered. Tolerating diet well. Will continue to monitor.  Problem: Education: Goal: Knowledge of General Education information will improve Outcome: Progressing   Problem: Health Behavior/Discharge Planning: Goal: Ability to manage health-related needs will improve Outcome: Progressing   Problem: Clinical Measurements: Goal: Ability to maintain clinical measurements within normal limits will improve Outcome: Progressing

## 2017-10-04 NOTE — Progress Notes (Signed)
[place] [facility] [place] NAME: Nina Hudson    MR#:  [id number]  DATE OF BIRTH:  1945-06-28  SUBJECTIVE:   Patient's mental status continues to improve.  Her p.o. intake remains stable.  Patient complaining of a cough but no other issues or events overnight.  REVIEW OF SYSTEMS:    Review of Systems  Constitutional: Negative for chills and fever.  HENT: Negative for congestion and tinnitus.   Eyes: Negative for blurred vision and double vision.  Respiratory: Negative for cough, shortness of breath and wheezing.   Cardiovascular: Negative for chest pain, orthopnea and PND.  Gastrointestinal: Negative for abdominal pain, diarrhea, nausea and vomiting.  Genitourinary: Negative for dysuria and hematuria.  Neurological: Positive for weakness (Globally weak). Negative for dizziness, sensory change and focal weakness.  All other systems reviewed and are negative.   Nutrition: Dysphagia 1 with thin liquids Tolerating Diet: Yes Tolerating PT: bedbound   DRUG ALLERGIES:   Allergies  Allergen Reactions  . Sulfa Antibiotics Other (See Comments)    "I get real achey" Other reaction(s): Muscle Pain, Other (See Comments)  General body aches andpain.     VITALS:  Blood pressure (!) 130/59, pulse 80, temperature (!) 97.5 F (36.4 C), temperature source Oral, resp. rate 18, height 5\' 5"  (1.651 m), weight 61.9 kg (136 lb 8 oz), SpO2 98 %.  PHYSICAL EXAMINATION:   Physical Exam  GENERAL:  72 y.o.-year-old patient lying in bed in no acute distress.  EYES: Pupils equal, round, reactive to light and accommodation. No scleral icterus. Extraocular muscles intact.  HEENT: Head atraumatic, normocephalic. Oropharynx and nasopharynx clear.  NECK:  Supple, no jugular venous distention. No thyroid enlargement, no tenderness.  LUNGS: Normal breath sounds bilaterally, no wheezing, rales, rhonchi. No use of accessory muscles of respiration.  CARDIOVASCULAR:  S1, S2 normal. No murmurs, rubs, or gallops.  ABDOMEN: Soft, nontender, nondistended. Bowel sounds present. No organomegaly or mass.  EXTREMITIES: No cyanosis, clubbing, + 1 dependent edema b/l.    NEUROLOGIC: Cranial nerves II through XII are intact. No focal Motor or sensory deficits b/l.  Globally weak.  PSYCHIATRIC: The patient is alert and oriented x 3.  SKIN: No obvious rash, lesion, or ulcer.    LABORATORY PANEL:   CBC Recent Labs  Lab 10/02/17 0738  WBC 9.6  HGB 11.1*  HCT 32.5*  PLT 220   ------------------------------------------------------------------------------------------------------------------  Chemistries  Recent Labs  Lab 10/04/17 0449  NA 141  K 4.5  CL 98  CO2 36*  GLUCOSE 207*  BUN 29*  CREATININE 0.67  CALCIUM 9.2  MG 2.0   ------------------------------------------------------------------------------------------------------------------  Cardiac Enzymes No results for input(s): TROPONINI in the last 168 hours. ------------------------------------------------------------------------------------------------------------------  RADIOLOGY:  No results found.   ASSESSMENT AND PLAN:   CarolMcCauleyis a8 y.o.femalewith a known history of diffuse large B cell lymphoma, uterine cancer, history of a fib on eliquis, diabetes, comes to the emergency room from [facility] due to acute on chronic hypoxic respiratory failure secondary to suspected pneumonia/G.I. bleed/acute on chronic anemia.  1.acute on chronic hypoxic respiratory failure secondary to right lower lobe pneumonia -Patient was treated with IV Zosyn for pneumonia but antibiotics have been stopped now.  PE has been ruled out. - Clinically stable from the respiratory standpoint presently. - PE has been ruled out  2.Acute encephalopathy - metabolic in nature due to infection and continues to improve.  - EEG (-) for seizures but just generalized slowing.   3.acute on chronic  anemia - patient was on eliquis and holding it at present - Status post 1 unit of transfusion - Hemoglobin stable and will cont. To monitor.   3.History of ERCP with stone removal and stent placement along with lap Chole complicated by cystic artery bleed requiring exploratory laparotomy - this was done at [facility] on 6/5 /2019. Currently stable.    4.Diabetes - cont. SSI.   - BS stable.   5.A. fib with RVR -rates are much better controlled.  Patient was somewhat hypotensive therefore metoprolol was discontinued. -Continue Cardizem, PRN digoxin..  6. Nutrition- pt. Is more awake today and PO intake is improving. Speech has advanced her diet and will cont. To monitor.  -no plans for PEG placement for now.    All the records are reviewed and case discussed with Care Management/Social Worker. Management plans discussed with the patient, family and they are in agreement.  CODE STATUS: Full code  DVT Prophylaxis: TED's & SCD's.   TOTAL TIME TAKING CARE OF THIS PATIENT: 25 minutes.   POSSIBLE D/C IN 2-3 DAYS, DEPENDING ON CLINICAL CONDITION.   Henreitta Leber M.D on 10/04/2017 at 12:49 PM  Between 7am to 6pm - Pager - [phone]  After 6pm go to [email or web address] - Proofreader  [facility]  Office  [phone]  CC: Primary care physician; Derinda Late, MD

## 2017-10-04 NOTE — Progress Notes (Signed)
Patient Name: Nina Hudson Date of Encounter: 10/04/2017  Hospital Problem List     Active Problems:   Acute respiratory failure with hypoxia (HCC)   Pressure injury of skin   Goals of care, counseling/discussion   Palliative care by specialist    Patient Profile     72 year old female with history of diffuse large B-cell lymphoma, uterine carcinoma, chronic atrial fibrillation who was admitted with sepsis and respiratory failure.  A. fib rate is improved.  She was taken off of Eliquis on admission to [facility].  She was on Eliquis on presentation.  She had a celiac artery stump bleed causing hemoperitoneum at [facility] post cholecystectomy.  She was stable with no obvious bleeding.  Hemoglobin stable off of Eliquis at present.  She was guaiac positive on admission with a drop in hemoglobin.  Subjective   Feels better.  Inpatient Medications    . budesonide (PULMICORT) nebulizer solution  0.5 mg Nebulization BID  . diltiazem  60 mg Oral Q6H  . glipiZIDE  5 mg Oral QAC breakfast  . guaiFENesin  600 mg Oral BID  . insulin aspart  0-15 Units Subcutaneous TID AC & HS  . ipratropium-albuterol  3 mL Nebulization Q6H  . mouth rinse  15 mL Mouth Rinse BID  . methylPREDNISolone (SOLU-MEDROL) injection  40 mg Intravenous Daily  . multivitamin with minerals  1 tablet Oral Daily  . nystatin  5 mL Mouth/Throat QID  . pantoprazole sodium  40 mg Oral BID  . traZODone  50 mg Oral QHS    Vital Signs    Vitals:   10/04/17 0459 10/04/17 0537 10/04/17 0750 10/04/17 0834  BP: (!) 115/92 120/61  112/61  Pulse: 78 70  75  Resp:    18  Temp:  97.6 F (36.4 C)  (!) 97.5 F (36.4 C)  TempSrc:  Oral  Oral  SpO2:  99% 98% 93%  Weight:  61.9 kg (136 lb 8 oz)    Height:        Intake/Output Summary (Last 24 hours) at 10/04/2017 1000 Last data filed at 10/04/2017 0028 Gross per 24 hour  Intake -  Output 1250 ml  Net -1250 ml   Filed Weights   10/02/17 0358 10/03/17 0416 10/04/17 0537  Weight:  63.3 kg (139 lb 9.6 oz) 64.5 kg (142 lb 4.8 oz) 61.9 kg (136 lb 8 oz)    Physical Exam    GEN: Well nourished, well developed, in no acute distress.  HEENT: normal.  Neck: Supple, no JVD, carotid bruits, or masses. Cardiac: RRR, no murmurs, rubs, or gallops. No clubbing, cyanosis, edema.  Radials/DP/PT 2+ and equal bilaterally.  Respiratory:  Respirations regular and unlabored, clear to auscultation bilaterally. GI: Soft, nontender, nondistended, BS + x 4. MS: no deformity or atrophy. Skin: warm and dry, no rash. Neuro:  Strength and sensation are intact. Psych: Normal affect.  Labs    CBC Recent Labs    10/02/17 0738  WBC 9.6  HGB 11.1*  HCT 32.5*  MCV 92.9  PLT 854   Basic Metabolic Panel Recent Labs    10/02/17 0738 10/04/17 0449  NA 139 141  K 4.4 4.5  CL 98 98  CO2 34* 36*  GLUCOSE 212* 207*  BUN 40* 29*  CREATININE 0.83 0.67  CALCIUM 8.7* 9.2  MG 2.0 2.0  PHOS 3.5 2.9   Liver Function Tests No results for input(s): AST, ALT, ALKPHOS, BILITOT, PROT, ALBUMIN in the last 72 hours. No results  for input(s): LIPASE, AMYLASE in the last 72 hours. Cardiac Enzymes No results for input(s): CKTOTAL, CKMB, CKMBINDEX, TROPONINI in the last 72 hours. BNP No results for input(s): BNP in the last 72 hours. D-Dimer No results for input(s): DDIMER in the last 72 hours. Hemoglobin A1C No results for input(s): HGBA1C in the last 72 hours. Fasting Lipid Panel No results for input(s): CHOL, HDL, LDLCALC, TRIG, CHOLHDL, LDLDIRECT in the last 72 hours. Thyroid Function Tests No results for input(s): TSH, T4TOTAL, T3FREE, THYROIDAB in the last 72 hours.  Invalid input(s): FREET3  Telemetry    Atrial fibrillation with controlled ventricular response  ECG    Fibrillation with controlled ventricular response  Radiology    Ct Abdomen Pelvis Wo Contrast  Result Date: 09/24/2017 CLINICAL DATA:  S/P ERCP and stent for stone removal. Now with persistent lactic acidosis.  PO contrast only pe chest done yesterday. EXAM: CT ABDOMEN AND PELVIS WITHOUT CONTRAST TECHNIQUE: Multidetector CT imaging of the abdomen and pelvis was performed following the standard protocol without IV contrast. COMPARISON:  CT chest from previous day, PET-CT 11/16/2012, and earlier studies FINDINGS: Lower chest: Bilateral pleural effusions right greater than left as before. Adjacent consolidation/atelectasis posteriorly in the lower lobes. Scattered subpleural patchy somewhat nodular opacities in the visualized lung bases as before. Hepatobiliary: Metallic CBD stent. Gas in the intrahepatic biliary tree implying stent patency. Cholecystectomy clips. No discrete liver lesion. Pancreas: Scattered coarse calcifications in the pancreatic head near the stent. No mass or ductal dilatation. Spleen: Normal in size without focal abnormality. Adrenals/Urinary Tract: Unremarkable adrenals. Residual contrast in the renal collecting systems without hydronephrosis. No focal renal lesion is evident. Foley catheter partially decompresses the urinary bladder. Stomach/Bowel: Stomach is nondilated. Incomplete distal passage of oral contrast material. Small bowel grossly unremarkable. Multiple sigmoid diverticula. Fecal material distends the rectum. There are mild perirectal and presacral edematous/inflammatory changes. Vascular/Lymphatic: Aortoiliac atherosclerosis (ICD10-170.0). Infrarenal aorta 2.6 cm diameter. Tortuous venous collateral channels in the left retroperitoneum associated with the left renal vein, present on studies dating back to 05/22/2007. No abdominal or pelvic adenopathy localized. Reproductive: Status post hysterectomy. No adnexal masses. Other: There is a small amount of high attenuation perihepatic and pelvic ascites. No free air. Musculoskeletal: Superior endplate compression deformities of T11, L1, L2, and L3, age indeterminate. No definite acute fracture or worrisome bone lesion. IMPRESSION: 1. Small  amount of perihepatic and pelvic ascites. Attenuation above simple fluid suggests blood, contrast material, or proteinaceous component. 2. Stable placement of patent metallic biliary stent. 3. Fecal distention of the rectum with regional inflammatory/edematous changes, suggesting possible stercoral proctitis. 4. Sigmoid diverticulosis. 5. Lower thoracic and lumbar mild compression deformities, age indeterminate 6. Ectatic abdominal aorta at risk for aneurysm development. Recommend followup by ultrasound in 5 years. This recommendation follows ACR consensus guidelines: White Paper of the ACR Incidental Findings Committee II on Vascular Findings. J Am Coll Radiol 2013; 10:789-794. Electronically Signed   By: Lucrezia Europe M.D.   On: 09/24/2017 12:07   Dg Chest 1 View  Result Date: 09/30/2017 CLINICAL DATA:  Hypoxia EXAM: CHEST  1 VIEW COMPARISON:  09/29/2017 FINDINGS: Cardiac shadow is stable. Aortic calcifications are again seen. Feeding catheter is noted coiled within the stomach stable from the prior exam. The lungs are well aerated bilaterally. Mild interstitial changes are seen with persistent right basilar atelectasis and small effusion. No bony abnormality is noted. IMPRESSION: Stable appearance of the chest from the previous day. Electronically Signed   By: Linus Mako.D.  On: 09/30/2017 08:20   Dg Chest 1 View  Result Date: 09/29/2017 CLINICAL DATA:  Hypoxia, shortness of breath, and weakness. Limited ability to give history. History of lymphoma, uterine malignancy, diabetes, and previous CVA. Former smoker. EXAM: CHEST  1 VIEW COMPARISON:  Portable chest x-ray of September 25, 2017 FINDINGS: The lungs are well-expanded. There is increased density at the right lung base with partial obscuration of the hemidiaphragm. A small right pleural effusion is suspected. The left lung exhibits no infiltrate or effusion. The interstitial markings of both lungs are coarse. The heart and pulmonary vascularity are  normal. A feeding tube is present with the radiodense tube tip projecting in the region of the gastric cardia with some coiling of the tube more inferiorly in the gastric body. IMPRESSION: Worsening atelectasis or pneumonia at the right lung base with small right pleural effusion. Underlying COPD. No CHF. Electronically Signed   By: David  Martinique M.D.   On: 09/29/2017 14:22   Dg Abd 1 View  Result Date: 09/23/2017 CLINICAL DATA:  72 year old female NG tube placement. EXAM: ABDOMEN - 1 VIEW COMPARISON:  Portable chest 1310 hours today. FINDINGS: Portable AP semi upright view at 1930 hours. Enteric tube courses to the left upper quadrant, tip at the level of the gastric body. The side hole is at the level of the gastroesophageal junction and could be advanced 5 centimeters for more optimal placement. Stable lung bases. Negative visible bowel gas pattern. There is a midline metallic stent projecting over the lower lumbar spine. No acute osseous abnormality identified. IMPRESSION: NG tube tip in the proximal stomach. Advance the tube 5 centimeters to ensure side hole placement inside the stomach. Electronically Signed   By: Genevie Ann M.D.   On: 09/23/2017 19:59   Ct Head Wo Contrast  Result Date: 09/23/2017 CLINICAL DATA:  Patient found unresponsive yesterday. EXAM: CT HEAD WITHOUT CONTRAST TECHNIQUE: Contiguous axial images were obtained from the base of the skull through the vertex without intravenous contrast. COMPARISON:  Brain MRI 08/21/2017. FINDINGS: Brain: No evidence of acute infarction, hemorrhage, hydrocephalus, extra-axial collection or mass lesion/mass effect. Cortical atrophy and chronic microvascular ischemic change are noted. Remote lacunar infarction left caudate head is identified. Vascular: No hyperdense vessel or unexpected calcification. Skull: Intact.  No focal lesion. Sinuses/Orbits: There is fairly extensive ethmoid air cell disease and marked mucosal thickening in the left maxillary sinus.  Mild mucosal thickening right sphenoid sinus is present. The left frontal sinus is opacified. Other: None. IMPRESSION: No acute abnormality. Atrophy and chronic microvascular ischemic change. Sinus disease. Electronically Signed   By: Inge Rise M.D.   On: 09/23/2017 14:30   Ct Angio Chest Pe W Or Wo Contrast  Result Date: 09/23/2017 CLINICAL DATA:  Acute respiratory failure. Recent exploratory laparotomy and cholecystectomy. History of lymphoma and uterine cancer. EXAM: CT ANGIOGRAPHY CHEST WITH CONTRAST TECHNIQUE: Multidetector CT imaging of the chest was performed using the standard protocol during bolus administration of intravenous contrast. Multiplanar CT image reconstructions and MIPs were obtained to evaluate the vascular anatomy. CONTRAST:  75mL ISOVUE-370 IOPAMIDOL (ISOVUE-370) INJECTION 76% COMPARISON:  Chest radiograph September 23, 2017 FINDINGS: CARDIOVASCULAR: Adequate contrast opacification of the pulmonary artery's. Main pulmonary artery is not enlarged. No pulmonary arterial filling defects to the level of the subsegmental branches. Heart size is normal, no right heart strain. Mild coronary artery calcifications. Trace pericardial effusion. Thoracic aorta is normal course and caliber, mild calcific atherosclerosis. MEDIASTINUM/NODES: No lymphadenopathy by CT size criteria. LUNGS/PLEURA: Moderate RIGHT and  small LEFT pleural effusions. Bilateral lower lobe compressive atelectasis versus pneumonia. Scattered clusters of bilateral tree-in-bud infiltrates. Bronchial wall thickening. Endotracheal tube tip above the carina. Mild centrilobular emphysema. No pneumothorax. UPPER ABDOMEN: Partially imaged biliary drain with pneumobilia and debris within surgical bed. MUSCULOSKELETAL: Thickened RIGHT platysma and subcutaneous fat stranding incompletely imaged. Mild T11, mild L1 and mild-to-moderate L2 old compression fractures. Mild spondylosis. Review of the MIP images confirms the above findings.  IMPRESSION: 1. Moderate RIGHT and small LEFT pleural effusions. Bibasilar atelectasis and/or pneumonia. 2. Bronchial wall thickening seen with bronchitis. Scattered tree-in-bud infiltrates may be infectious or inflammatory. 3. Partially imaged biliary stent with pneumobilia and debris within gallbladder fossa. Aortic Atherosclerosis (ICD10-I70.0) and Emphysema (ICD10-J43.9). Electronically Signed   By: Elon Alas M.D.   On: 09/23/2017 22:10   Dg Chest Port 1 View  Result Date: 09/25/2017 CLINICAL DATA:  Respiratory failure. EXAM: PORTABLE CHEST 1 VIEW COMPARISON:  09/24/2017, 09/23/2017.  CT 09/23/2017. FINDINGS: Endotracheal tube and NG tube in stable position. Heart size normal. Mild bibasilar atelectasis. Interim improvement of previously identified small bilateral pleural effusions. No pneumothorax. IMPRESSION: 1.  Lines and tubes in stable position. 2. Mild bibasilar atelectasis. Interim improvement of previously identified small bilateral pleural effusions. Electronically Signed   By: Marcello Moores  Register   On: 09/25/2017 07:14   Dg Chest Port 1 View  Result Date: 09/24/2017 CLINICAL DATA:  Acute respiratory failure EXAM: PORTABLE CHEST 1 VIEW COMPARISON:  Yesterday FINDINGS: Endotracheal tube tip between the clavicular heads and carina. An orogastric tube reaches the stomach. Artifact from EKG leads. Layering pleural effusions with atelectasis. There is hyperinflation. Normal heart size. IMPRESSION: 1. Unremarkable positioning of endotracheal and orogastric tubes. 2. Small bilateral layering pleural effusion. 3. Probable COPD. Electronically Signed   By: Monte Fantasia M.D.   On: 09/24/2017 07:55   Dg Chest Port 1 View  Result Date: 09/23/2017 CLINICAL DATA:  Tube placement. EXAM: PORTABLE CHEST 1 VIEW COMPARISON:  Radiographs of April 10, 2014. FINDINGS: Stable cardiomediastinal silhouette. Interval placement of endotracheal tube with distal tip 6 cm above the carina. No pneumothorax is  noted. Mild bibasilar subsegmental atelectasis or edema is noted with small pleural effusions. Bony thorax is unremarkable. IMPRESSION: Endotracheal tube in grossly good position. Nasogastric tube is seen entering stomach. Mild bibasilar subsegmental atelectasis or edema is noted with small pleural effusions. Electronically Signed   By: Marijo Conception, M.D.   On: 09/23/2017 13:55   Dg Abd Portable 1v  Result Date: 09/25/2017 CLINICAL DATA:  Encounter for nasogastric tube placement EXAM: PORTABLE ABDOMEN - 1 VIEW COMPARISON:  Chest x-ray from earlier today FINDINGS: A nasogastric tube has been replaced by a feeding tube with tip at the fundus. Haziness at the bases from atelectasis and pleural fluid based on prior abdominal CT. Metallic biliary stent in place. Previously seen oral contrast has reached the colon. IMPRESSION: When compared to chest x-ray earlier today the orogastric tube has been replaced by a feeding tube with tip at the gastric fundus. Electronically Signed   By: Monte Fantasia M.D.   On: 09/25/2017 13:22    Assessment & Plan    72 year old female with history of a previous diagnosis of lymphoma.  Currently appears to be in remission per note from hematology.  She had a prolonged stay at an outside hospital due to complications of a cholecystectomy.  She was discharged on Eliquis and readmitted here with probable urinary sepsis.  She had mild anemia and was taken off of  Eliquis.  No obvious bleeding at present.  Patient's chads score is 1 for female gender, 1 for age greater than 28, 1 for diabetes for a total of 3.  Patient did require transfusion on admission and had guaiac positive stools.  Will remain off Eliquis for now however need to consider this to be added back in the future.  Continue with current medication for rate control.  Signed, Javier Docker Elishah Ashmore MD 10/04/2017, 10:00 AM  Pager: [phone]

## 2017-10-04 NOTE — Progress Notes (Signed)
Pt refused chest pt ,

## 2017-10-04 NOTE — Progress Notes (Signed)
PHARMACIST - PHYSICIAN COMMUNICATION    CONCERNING: IV to Oral Route Change Policy  RECOMMENDATION: This patient is receiving Protonix by the intravenous route.  Based on criteria approved by the Pharmacy and Therapeutics Committee, the intravenous medication(s) is/are being converted to the equivalent oral dose form(s).   DESCRIPTION: These criteria include:  The patient is eating (either orally or via tube) and/or has been taking other orally administered medications for a least 24 hours  The patient has no evidence of active gastrointestinal bleeding or impaired GI absorption (gastrectomy, short bowel, patient on TNA or NPO).  If you have questions about this conversion, please contact the Pharmacy Department  []   [phone] )  [facility] [x]   [phone] )  [facility] []   [phone] )  [facility] []   [phone] )  [facility] []   [phone] )  [place], [facility] 10/04/2017 7:15 AM

## 2017-10-05 DIAGNOSIS — Z515 Encounter for palliative care: Secondary | ICD-10-CM

## 2017-10-05 DIAGNOSIS — Z7189 Other specified counseling: Secondary | ICD-10-CM

## 2017-10-05 LAB — GLUCOSE, CAPILLARY
GLUCOSE-CAPILLARY: 322 mg/dL — AB (ref 70–99)
GLUCOSE-CAPILLARY: 304 mg/dL — AB (ref 70–99)
Glucose-Capillary: 252 mg/dL — ABNORMAL HIGH (ref 70–99)
Glucose-Capillary: 157 mg/dL — ABNORMAL HIGH (ref 70–99)

## 2017-10-05 MED ORDER — PREDNISONE 10 MG PO TABS: 10.0000 mg | ORAL_TABLET | Freq: Every day | ORAL | Status: DC

## 2017-10-05 MED ORDER — VITAMIN C 500 MG PO TABS
250.0000 mg | ORAL_TABLET | Freq: Two times a day (BID) | ORAL | Status: DC
  Administered 2017-10-05 – 2017-10-06 (×3): 250 mg via ORAL
  Filled 2017-10-05 (×4): qty 0.5

## 2017-10-05 MED ORDER — PREDNISONE 20 MG PO TABS: 40.0000 mg | ORAL_TABLET | Freq: Every day | ORAL | Status: DC

## 2017-10-05 MED ORDER — PREDNISONE 50 MG PO TABS
50.0000 mg | ORAL_TABLET | Freq: Every day | ORAL | Status: AC
  Administered 2017-10-06: 50 mg via ORAL
  Filled 2017-10-05: qty 1

## 2017-10-05 MED ORDER — SODIUM CHLORIDE 0.9% FLUSH
3.0000 mL | Freq: Two times a day (BID) | INTRAVENOUS | Status: DC
  Administered 2017-10-05 – 2017-10-06 (×3): 3 mL via INTRAVENOUS

## 2017-10-05 MED ORDER — ENSURE ENLIVE PO LIQD
237.0000 mL | Freq: Two times a day (BID) | ORAL | Status: DC
  Administered 2017-10-05 – 2017-10-06 (×3): 237 mL via ORAL

## 2017-10-05 MED ORDER — PREDNISONE 20 MG PO TABS: 20.0000 mg | ORAL_TABLET | Freq: Every day | ORAL | Status: DC

## 2017-10-05 MED ORDER — PREDNISONE 20 MG PO TABS: 30.0000 mg | ORAL_TABLET | Freq: Every day | ORAL | Status: DC

## 2017-10-05 NOTE — Plan of Care (Signed)
  Problem: Clinical Measurements: Goal: Diagnostic test results will improve Outcome: Progressing Goal: Respiratory complications will improve Outcome: Progressing   

## 2017-10-05 NOTE — Care Management Important Message (Signed)
Copy of signed IM left with patient in room.  

## 2017-10-05 NOTE — Progress Notes (Signed)
PT Cancellation Note  Patient Details Name: Nina Hudson MRN: [id number] DOB: September 30, 1945   Cancelled Treatment:    Reason Eval/Treat Not Completed: Patient declined, no reason specified. Treatment attempted; pt refuses. Offered gentle encouragement, but pt firmly declines and requests that it be "put off until tomorrow" due to feeling "terrible". Re attempt at a later time, as the schedule allows.    Larae Grooms, PTA 10/05/2017, 3:57 PM

## 2017-10-05 NOTE — Progress Notes (Signed)
Daily Progress Note   Patient Name: Nina Hudson       Date: 10/05/2017 DOB: May 12, 1945  Age: 72 y.o. MRN#: [id number] Attending Physician: Henreitta Leber, MD Primary Care Physician: Derinda Late, MD Admit Date: 09/23/2017  Reason for Consultation/Follow-up: Establishing goals of care and Psychosocial/spiritual support  Subjective:  Patient will wake to voice. Alert and oriented to person/place. Denies pain or discomfort. Following commands and able to answer questions. She tells me she ate breakfast and will work with physical therapy today.   No family at bedside.  Length of Stay: 12  Current Medications: Scheduled Meds:  . budesonide (PULMICORT) nebulizer solution  0.5 mg Nebulization BID  . diltiazem  60 mg Oral Q6H  . feeding supplement (ENSURE ENLIVE)  237 mL Oral BID BM  . glipiZIDE  5 mg Oral QAC breakfast  . guaiFENesin  600 mg Oral BID  . insulin aspart  0-15 Units Subcutaneous TID AC & HS  . ipratropium-albuterol  3 mL Nebulization Q6H  . mouth rinse  15 mL Mouth Rinse BID  . multivitamin with minerals  1 tablet Oral Daily  . nystatin  5 mL Mouth/Throat QID  . pantoprazole sodium  40 mg Oral BID  . [START ON 10/06/2017] predniSONE  50 mg Oral Q breakfast   Followed by  . [START ON 10/07/2017] predniSONE  40 mg Oral Q breakfast   Followed by  . [START ON 10/08/2017] predniSONE  30 mg Oral Q breakfast   Followed by  . [START ON 10/09/2017] predniSONE  20 mg Oral Q breakfast   Followed by  . [START ON 10/10/2017] predniSONE  10 mg Oral Q breakfast  . sodium chloride flush  3 mL Intravenous Q12H  . traZODone  50 mg Oral QHS  . vitamin C  250 mg Oral BID    Continuous Infusions:   PRN Meds: acetaminophen **OR** acetaminophen, ipratropium-albuterol, polyethylene glycol,  sennosides  Physical Exam  Constitutional: She is easily aroused.  HENT:  Head: Normocephalic and atraumatic.  Pulmonary/Chest: No accessory muscle usage. No tachypnea. No respiratory distress.  Neurological: She is alert and easily aroused.  Wakes to voice. Oriented to person and place.  Skin: Skin is warm and dry.  Psychiatric: She has a normal mood and affect. Her speech is normal and behavior is normal.  Nursing note and vitals reviewed.     Vital Signs: BP 107/78   Pulse 94   Temp 98.3 F (36.8 C) (Oral)   Resp 18   Ht 5\' 5"  (1.651 m)   Wt 62.3 kg (137 lb 6.4 oz)   SpO2 92%   BMI 22.86 kg/m  SpO2: SpO2: 92 % O2 Device: O2 Device: Nasal Cannula O2 Flow Rate: O2 Flow Rate (L/min): 2 L/min  Intake/output summary:   Intake/Output Summary (Last 24 hours) at 10/05/2017 1541 Last data filed at 10/05/2017 1527 Gross per 24 hour  Intake 120 ml  Output 1550 ml  Net -1430 ml   LBM: Last BM Date: 10/03/17 Baseline Weight: Weight: 59 kg (130 lb) Most recent weight: Weight: 62.3 kg (137 lb 6.4 oz)       Palliative Assessment/Data: PPS 40%   Flowsheet Rows  Most Recent Value  Intake Tab  Referral Department  Critical care  Unit at Time of Referral  ICU  Palliative Care Primary Diagnosis  Sepsis/Infectious Disease  Date Notified  09/23/17  Palliative Care Type  New Palliative care  Reason for referral  Clarify Goals of Care  Date of Admission  09/23/17  Date first seen by Palliative Care  09/24/17  # of days Palliative referral response time  1 Day(s)  # of days IP prior to Palliative referral  0  Clinical Assessment  Palliative Performance Scale Score  40%  Psychosocial & Spiritual Assessment  Palliative Care Outcomes  Patient/Family meeting held?  Yes  Who was at the meeting?  son, daughter, husband  Palliative Care Outcomes  Provided psychosocial or spiritual support, Counseled regarding hospice, ACP counseling assistance      Patient Active Problem List    Diagnosis Date Noted  . Goals of care, counseling/discussion   . Palliative care by specialist   . Pneumonia 09/23/2017  . Altered mental status 09/23/2017  . Acute respiratory failure with hypoxia ([place]) 09/23/2017  . Pressure injury of skin 09/23/2017    Palliative Care Assessment & Plan   HPI: 72 y.o. 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 Per NP note from facility, patient was found minimally responsive with tachypnea and tachycardia, mottled legs. CXR at facility revealed pna vs edema. Husband was called and he wanted full code and transfer to hospital. Patient was supposed to be seen by palliative care at the facility but the visit had not been completed yet. Patient was recently hospitalized at [facility] 9/7-9/89QJJ complicated cholecystitis with cholecystectomy. She underwent an ERCP with stent placement and stone extraction with lap chole. She developed hypotension and bleeding postop requiring emergent transport back to the OR the same day, and wasfound to have hemoperitoneum due to clip falling off cystic artery resulting in cystic artery stump bleed requiring ligation. She was discharged from [facility] on 09/18/17 to the [facility] for rehab. Since discharge from [facility] she has been weak and fatigued with minimal PO intake. Upon arrival to [facility] pt required intubation d/t agonal respirations. Found to be guaiac positive and hgb 8.4 down from 10.2. Received 1 unit PRBCs. Lactic acid 7.5. CT abd from 6/20 revealed small amount of perihepatic and pelvic ascites and stable placement of patent metallic biliary stent. She was extubated 6/21. Chest xray worsening as of 6/25, worsening respiratory and mental  status. No PO intake. Tube feeding stopped d/t concern for aspiration. PMT consulted for [place].   Assessment: Acute respiratory failure RLL pneumonia Acute encephalopathy improving Acute on chronic anemia Afib RVR Hx of CVA Hx of lap chole complicated by cystic artery bleed requiring exploratory laparotomy  Recommendations/Plan:  Continue FULL code/FULL scope treatment. Patient showing clinical improvement and now awake, alert, and accepting food. No PEG indicated at this time. Continue watchful waiting.   Likely SNF for rehab. Outpatient palliative referral.   Code Status:  Full code  Prognosis:   Unable to determine guarded long-term prognosis d/t functional declines and failure to thrive   Discharge Planning:  To Be Determined - likely SNF rehab with palliative if no acute decompensation  Care plan was  discussed with RN  Thank you for allowing the Palliative Medicine Team to assist in the care of this patient.   Total Time 41min Prolonged Time Billed  no      Greater than 50%  of this time was spent counseling and coordinating care related to the above assessment and plan.  Ihor Dow, FNP-C Palliative Medicine Team  Phone: [phone] Fax: [phone]

## 2017-10-05 NOTE — Progress Notes (Signed)
Speech Language Pathology Treatment: Dysphagia  Patient Details Name: Nina Hudson MRN: [id number] DOB: 15-Jun-1945 Today's Date: 10/05/2017 Time: 0900-0950 SLP Time Calculation (min) (ACUTE ONLY): 50 min  Assessment / Plan / Recommendation Clinical Impression  Pt seen for ongoing assessment of toleration of oral intake and thin liquids; trials of solids/mech soft foods in order to upgrade diet from puree consistency. Pt has been tolerating the currently recommended dysphagia diet of level 1(puree w/ thin liquids ordered Sat and post removal of NG(support); pt and friend reported good intake at meals w/ no swallowing problems w/ the thin liquids - pt allowing others to feed her(liquids) though. Pt continues to present w/ weakness but alert and follows through w/ tasks w/ min cues intermittently. Pt appeared stronger in her vocal quality and cough this morning; she stated she had been using the Incentive Spirometer "some more".   Pt is awake/alert to verbally respond and follow basic commands w/ SLP. Pt helped to hold her own cup for drinking as instructed to; education given to friend and CNA. Pt positioned fully upright and given thin liquid trials VIA CUP. Pt consumed trials of water and juice w/ no immediate, overt s/s of aspiration noted. Pt was instructed, and she followed through, w/ taking SMALL, SINGLE sips from the Cup - SLOWLY. Clear vocal quality and no decline in respiratory status noted during/post trials. Pt then consumed trials of soft solids/Mech Soft foods w/ adequate oral phase bolus management noted - mastication and A-P transfer of the trials was appropriate and timely. Oral clearing noted b/t trials w/ min extra time - pt gave a verbal cue when she was ready to take another bite. Pt consumed ~11 boluses w/ no increased fatigue w/ trials taken.  Pt and friend were educated on how to HOLD the Utensil and the Cup together giving support to pt - it is important for pt to hold her own  Cup also when drinking for safer swallowing/intake.  Thorough education given on following the aspiration precautions as given/posted - especially using a CUP for drinking (NO STRAWS). Recommended use of dysphagia drink cup or another cup that can restrict flow of volume of fluid to allow pt to have more control of the volume during the swallowing/intake if needed. Will upgrade diet to Thin liquids w/ the Mech Soft foods w/ rest; monitoring and assistance at meals as pt is easily fatigued(hand over hand feeding); Pills given IN PUREE. SLP services will f/u next 1-2 days w/ toleration of diet. Pt/friend agreed. NSG updated. Menu discussed.  Of note, thorough education and instruction/model given on using the Incentive Spirometer Pt was able to demonstrate appropriate use w/ SLP. NSG updated.    HPI   Pt is a64 y.o.femalewith a known history of diffuse large B cell lymphoma, uterine cancer, history of a fib on eliquis, diabetes, comes to the emergency room from [facility] where she was admitted on 09/18/17. Patient is being admitted type for acute on chronic hypoxic respiratory failure secondary to suspected pneumonia/G.I. bleed/acute on chronic anemia. Patient was intubated after admission. Extubated w/ success; NG tube in place for nutrition support initially then removed yesterday to allow pt to have hunger cues/desire to eat orally hopefully. Palliative Care is following pt and family for goals of care.       SLP Plan  Continue with current plan of care       Recommendations  Diet recommendations: Dysphagia 3 (mechanical soft);Thin liquid Liquids provided via: Cup;No straw Medication Administration: Whole meds  with puree(as able to tolerate) Supervision: Patient able to self feed;Full supervision/cueing for compensatory strategies;Staff to assist with self feeding(d/t overall weakness) Compensations: Minimize environmental distractions;Slow rate;Small sips/bites;Lingual sweep for clearance of  pocketing;Multiple dry swallows after each bite/sip;Follow solids with liquid Postural Changes and/or Swallow Maneuvers: Seated upright 90 degrees;Upright 30-60 min after meal                General recommendations: (Dietician f/u) Oral Care Recommendations: Oral care BID;Staff/trained caregiver to provide oral care;Patient independent with oral care Follow up Recommendations: None SLP Visit Diagnosis: Dysphagia, unspecified (R13.10) Plan: Continue with current plan of care       [place], [place], CCC-SLP Watson,Katherine 10/05/2017, 10:08 AM

## 2017-10-05 NOTE — Progress Notes (Signed)
[place] at [place] NAME: Nina Hudson    MR#:  [id number]  DATE OF BIRTH:  Jun 01, 1945  SUBJECTIVE:   P.o. intake is significantly improved over the past few days.  Patient is more awake and alert, no other acute events overnight.  REVIEW OF SYSTEMS:    Review of Systems  Constitutional: Negative for chills and fever.  HENT: Negative for congestion and tinnitus.   Eyes: Negative for blurred vision and double vision.  Respiratory: Negative for cough, shortness of breath and wheezing.   Cardiovascular: Negative for chest pain, orthopnea and PND.  Gastrointestinal: Negative for abdominal pain, diarrhea, nausea and vomiting.  Genitourinary: Negative for dysuria and hematuria.  Neurological: Positive for weakness (Globally weak). Negative for dizziness, sensory change and focal weakness.  All other systems reviewed and are negative.   Nutrition: Dysphagia 3 with thin liquids Tolerating Diet: Yes Tolerating PT: bedbound   DRUG ALLERGIES:   Allergies  Allergen Reactions  . Sulfa Antibiotics Other (See Comments)    "I get real achey" Other reaction(s): Muscle Pain, Other (See Comments)  General body aches andpain.     VITALS:  Blood pressure 107/78, pulse 94, temperature 98.3 F (36.8 C), temperature source Oral, resp. rate 18, height 5\' 5"  (1.651 m), weight 62.3 kg (137 lb 6.4 oz), SpO2 92 %.  PHYSICAL EXAMINATION:   Physical Exam  GENERAL:  72 y.o.-year-old patient lying in bed in no acute distress.  EYES: Pupils equal, round, reactive to light and accommodation. No scleral icterus. Extraocular muscles intact.  HEENT: Head atraumatic, normocephalic. Oropharynx and nasopharynx clear.  NECK:  Supple, no jugular venous distention. No thyroid enlargement, no tenderness.  LUNGS: Normal breath sounds bilaterally, no wheezing, rales, rhonchi. No use of accessory muscles of respiration.  CARDIOVASCULAR: S1, S2 normal. No murmurs,  rubs, or gallops.  ABDOMEN: Soft, nontender, nondistended. Bowel sounds present. No organomegaly or mass.  EXTREMITIES: No cyanosis, clubbing, + 1 dependent edema b/l.    NEUROLOGIC: Cranial nerves II through XII are intact. No focal Motor or sensory deficits b/l.  Globally weak.  PSYCHIATRIC: The patient is alert and oriented x 3.  SKIN: No obvious rash, lesion, or ulcer.    LABORATORY PANEL:   CBC Recent Labs  Lab 10/02/17 0738  WBC 9.6  HGB 11.1*  HCT 32.5*  PLT 220   ------------------------------------------------------------------------------------------------------------------  Chemistries  Recent Labs  Lab 10/04/17 0449  NA 141  K 4.5  CL 98  CO2 36*  GLUCOSE 207*  BUN 29*  CREATININE 0.67  CALCIUM 9.2  MG 2.0   ------------------------------------------------------------------------------------------------------------------  Cardiac Enzymes No results for input(s): TROPONINI in the last 168 hours. ------------------------------------------------------------------------------------------------------------------  RADIOLOGY:  No results found.   ASSESSMENT AND PLAN:   CarolMcCauleyis a65 y.o.femalewith a known history of diffuse large B cell lymphoma, uterine cancer, history of a fib on eliquis, diabetes, comes to the emergency room from [facility] due to acute on chronic hypoxic respiratory failure secondary to suspected pneumonia/G.I. bleed/acute on chronic anemia.  1.acute on chronic hypoxic respiratory failure secondary to right lower lobe pneumonia -Patient was treated with IV Zosyn for pneumonia but antibiotics have been stopped now.  PE has been ruled out. - Clinically stable from the respiratory standpoint presently.  2.Acute encephalopathy - metabolic in nature due to infection and continues to improve.  - EEG (-) for seizures but just generalized slowing.   3.acute on chronic anemia - patient was on eliquis and holding it at  present - Status post 1 unit of transfusion - Hemoglobin remains stable.   3.History of ERCP with stone removal and stent placement along with lap Chole complicated by cystic artery bleed requiring exploratory laparotomy - this was done at [facility] on 6/5 /2019. Currently stable.    4.Diabetes - cont. SSI.   - BS stable.   5.A. fib with RVR -rates are much better controlled.  Patient was somewhat hypotensive therefore metoprolol was discontinued. -Continue Cardizem, PRN digoxin.  6. Nutrition- oral intake is improving.  Diet has been advanced to a dysphagia 3 diet with thin liquids. --no plans for PEG placement for now.   Likely discharge to short-term rehab tomorrow.  All the records are reviewed and case discussed with Care Management/Social Worker. Management plans discussed with the patient, family and they are in agreement.  CODE STATUS: Full code  DVT Prophylaxis: TED's & SCD's.   TOTAL TIME TAKING CARE OF THIS PATIENT: 25 minutes.   POSSIBLE D/C IN 1-2 DAYS, DEPENDING ON CLINICAL CONDITION.   Henreitta Leber M.D on 10/05/2017 at 2:00 PM  Between 7am to 6pm - Pager - [phone]  After 6pm go to [email or web address] - Proofreader  [facility]  Office  [phone]  CC: Primary care physician; Derinda Late, MD

## 2017-10-05 NOTE — Progress Notes (Addendum)
Called patient's husband and updated him on the patient's plan of care that she is improving and we can consider possible discharge to rehab tomorrow.  Patient's husband was not happy with the plan of her being discharged tomorrow as he thinks she still weak and not ready for discharge. - I explained to the husband that she would further get physical therapy and speech therapy at the rehab facility but he was still quite argumentative and does not want her to be discharged tomorrow. - I have notified the social worker regarding this.

## 2017-10-05 NOTE — Progress Notes (Signed)
Nutrition Follow-up  INTERVENTION:   Ensure Enlive po BID, each supplement provides 350 kcal and 20 grams of protein  Magic cup TID with meals, each supplement provides 290 kcal and 9 grams of protein  Vital Cuisine TID, each supplement provides 520kcal and 22g of protein.   MVI daily  Add vitamin C 250mg  BID  NUTRITION DIAGNOSIS:   Inadequate oral intake related to acute illness as evidenced by NPO status.  - pt's diet advanced but she is still unable to meet her estimated needs   GOAL:   Patient will meet greater than or equal to 90% of their needs -progressing  MONITOR:   PO intake, Supplement acceptance, Labs, Weight trends, I & O's, Skin  ASSESSMENT:   72 yo female with a PMH of Stroke, HTN, Rheumatoid Arthritis,  Hypercholesteremia, Dysrhythmia, Diabetes Mellitus, Uterine Cancer, Diffuse Large B Cell Lymphoma, Atrial Fibrillation (previously on eliquis held due to recent surgery), Chronic Diastolic CHF (EF 55 to 25% on 09/08/17) , Depression, and Arthritis.  She presented to [facility] ER on 06/19 via EMS from the [facility] after being found minimally responsive with rapid respirations and increased heart rate. She recently had a complicated hospitalization at [facility] from [id number]/7046-88/91/6945 for complicated cholecystitis with cholecystectomy. She underwent an ERCP with metal stent placement and stone extraction with lap chole.  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 She was discharged from [facility] on 09/18/17 to the [facility] for rehab.    NGT removed 6/30. Pt advanced to dysphagia 3/thin liquid diet. Pt's oral intake is improving; ate 50% of an omelet this morning. Pt is not eating enough to meet her estimated needs. Supplements being provided on meal trays. Will add  Ensure. Per chart, pt with initial weight gain but weight is coming back down now. Pt noted to have scattered ecchymosis; recommend vitamin C supplementation. Continue MVI.   Medications reviewed and include: glipizide, insulin, solu-medrol, MVI, protonix   Labs reviewed: K 4.5 wnl, P 2.9 wnl, Mg 2.0 wnl cbgs- 175, 182, 234, 243, 252 x 24 hrs  Diet Order:   Diet Order           DIET DYS 3 Room service appropriate? Yes with Assist; Fluid consistency: Thin  Diet effective now         EDUCATION NEEDS:   Not appropriate for education at this time  Skin:  Skin Assessment: Skin Integrity Issues:(closed incision to abdomen; scattered ecchymosis; left arm weeping)  Last BM:  6/29  Height:   Ht Readings from Last 1 Encounters:  09/23/17 5\' 5"  (1.651 m)    Weight:   Wt Readings from Last 1 Encounters:  10/05/17 137 lb 6.4 oz (62.3 kg)    Ideal Body Weight:  56.8 kg  BMI:  Body mass index is 22.86 kg/m.  Estimated Nutritional Needs:   Kcal:  1465-1760 (25-30 kcal/kg)  Protein:  75-90 grams (1.3-1.5 grams/kg)  Fluid:  1.5 L/day (25 mL/kg)  Koleen Distance MS, RD, LDN Pager #- [phone] Office#- [phone] After Hours Pager: [phone]

## 2017-10-05 NOTE — Clinical Social Work Note (Addendum)
CSW spoke with patient and she would like to return back to [facility] to continue with her therapy.  CSW contacted [facility] and sent updated clincal notes to SNF.  [facility] can accept patient once she is medically ready for discharge and orders have been received.  CSW to continue to follow patient's progress throughout discharge planning.  Jones Broom. [place], MSW, [place]  10/05/2017 5:29 PM

## 2017-10-05 NOTE — Progress Notes (Signed)
Occupational Therapy Treatment Patient Details Name: Nina Hudson MRN: [id number] DOB: 1946/01/30 Today's Date: 10/05/2017    History of present illness Pt. is a 72 y.o. female pt presented to ER secondary to SOB, weakness, lethargy, noted with guaic positive stools; admitted with acute/chronic respiratory failure secondary to R LL PNA (requiring intubation 6/19-6/21), now on 2L supplemental O2. Of note, patient with recent hospitalization at outside hospital (6/2-6/14) due to acute cholecystitis/choledocholethiasis, status post ERCP with stent and stone removal and laprascopic cholecystectomy.  Course complicated by cystic artery bleed requiring exploratory laparotomy.  Discharge to STR due to significant weakness/deconditioning after extended hospitalization.   OT comments  Pt seen for OT tx this date. Pt with eyes closed, pt/family's PCA present. Pt noted with continued BUE edema, seeping. Chuck pads under B arms. OT increased elevated positioning with pillow and caregiver educated in proper positioning and elevation to minimize edema. Rolled washcloths placed in B hands for optimal resting hand positioning. Pillow adjusted under BLE to help float her heels. Edema noted in BLE distal to knee as well. RN notified of weeping BUE, already aware. Pt continues to benefit from skilled OT services to address noted impairments and functional deficits in all aspects of ADL and mobility. STR remains appropriate. Will continue to progress next session.   Follow Up Recommendations  SNF    Equipment Recommendations  3 in 1 bedside commode    Recommendations for Other Services      Precautions / Restrictions Precautions Precautions: Fall Restrictions Weight Bearing Restrictions: No Other Position/Activity Restrictions: keep BUE elevated with pillows when in bed to help minimize edema       Mobility Bed Mobility                  Transfers                      Balance                                            ADL either performed or assessed with clinical judgement   ADL Overall ADL's : Needs assistance/impaired Eating/Feeding: Moderate assistance;Bed level Eating/Feeding Details (indicate cue type and reason): provided pt/caregiver with built up handles to trial next session                                         Vision Baseline Vision/History: Wears glasses Wears Glasses: At all times Patient Visual Report: No change from baseline     Perception     Praxis      Cognition Arousal/Alertness: Awake/alert Behavior During Therapy: WFL for tasks assessed/performed Overall Cognitive Status: Within Functional Limits for tasks assessed                                 General Comments: Pt. kept eyes closed throughout session. Opens her eyes upon request.         Exercises Other Exercises Other Exercises: Pt noted with continued BUE edema, seeping. Chuck pads under B arms. OT increased elevated positioning with pillow and educated caregiver present for proper positioning and elevation to minimize edema. Rolled washcloths placed in B hands for optimal resting hand positioning. Pillow adjusted under  BLE to help float her heels. Edema noted in BLE distal to knee as well.    Shoulder Instructions       General Comments      Pertinent Vitals/ Pain       Pain Assessment: No/denies pain Pain Intervention(s): Monitored during session  Home Living                                          Prior Functioning/Environment              Frequency  Min 1X/week        Progress Toward Goals  OT Goals(current goals can now be found in the care plan section)  Progress towards OT goals: OT to reassess next treatment  Acute Rehab OT Goals Patient Stated Goal: To return home to her dog. OT Goal Formulation: With patient Time For Goal Achievement: 10/12/17 Potential to Achieve Goals:  Good  Plan Discharge plan remains appropriate    Co-evaluation                 AM-PAC PT "6 Clicks" Daily Activity     Outcome Measure   Help from another person eating meals?: A Lot Help from another person taking care of personal grooming?: A Lot Help from another person toileting, which includes using toliet, bedpan, or urinal?: Total Help from another person bathing (including washing, rinsing, drying)?: A Lot Help from another person to put on and taking off regular upper body clothing?: A Lot Help from another person to put on and taking off regular lower body clothing?: A Lot 6 Click Score: 11    End of Session    OT Visit Diagnosis: Other abnormalities of gait and mobility (R26.89);Muscle weakness (generalized) (M62.81)   Activity Tolerance Patient tolerated treatment well   Patient Left in bed;with call bell/phone within reach;with bed alarm set;with family/visitor present   Nurse Communication          Time: 1417-1430 OT Time Calculation (min): 13 min  Charges: OT General Charges $OT Visit: 1 Visit OT Treatments $Therapeutic Activity: 8-22 mins   Jeni Salles, MPH, MS, OTR/L ascom [phone] 10/05/17, 2:40 PM

## 2017-10-05 NOTE — Progress Notes (Signed)
Inpatient Diabetes Program Recommendations  AACE/ADA: New Consensus Statement on Inpatient Glycemic Control (2015)  Target Ranges:  Prepandial:   less than 140 mg/dL      Peak postprandial:   less than 180 mg/dL (1-2 hours)      Critically ill patients:  140 - 180 mg/dL   Lab Results  Component Value Date   GLUCAP 252 (H) 10/05/2017    Review of Glycemic ControlResults for SHERLIE, BOYUM (MRN [id number]) as of 10/05/2017 11:33  Ref. Range 10/04/2017 08:06 10/04/2017 12:19 10/04/2017 16:19 10/04/2017 21:16 10/05/2017 07:32  Glucose-Capillary Latest Ref Range: 70 - 99 mg/dL 175 (H) 182 (H) 234 (H) 243 (H) 252 (H)    Diabetes history: Type 2 DM  Outpatient Diabetes medications: Glipizide 5 mg daily, Metformin 500 mg bid  Current orders for Inpatient glycemic control:  Novolog moderate tid with meals and HS, Glucotrol 5 mg daily with breakfast  Inpatient Diabetes Program Recommendations:    Consider d/c of glucotrol while patient is in the hospital.  May consider adding low dose basal insulin while in the hospital.  Consider adding Lantus 8 units daily.    Thanks,  Adah Perl, RN, BC-ADM Inpatient Diabetes Coordinator Pager [phone] (8a-5p)

## 2017-10-06 LAB — BASIC METABOLIC PANEL
ANION GAP: 7 (ref 5–15)
BUN: 30 mg/dL — AB (ref 8–23)
CHLORIDE: 99 mmol/L (ref 98–111)
CO2: 34 mmol/L — AB (ref 22–32)
Calcium: 9.4 mg/dL (ref 8.9–10.3)
Creatinine, Ser: 0.91 mg/dL (ref 0.44–1.00)
GFR calc Af Amer: >60 mL/min (ref >60)
GLUCOSE: 265 mg/dL — AB (ref 70–99)
Potassium: 4.1 mmol/L (ref 3.5–5.1)
Sodium: 140 mmol/L (ref 135–145)

## 2017-10-06 LAB — GLUCOSE, CAPILLARY
GLUCOSE-CAPILLARY: 278 mg/dL — AB (ref 70–99)
GLUCOSE-CAPILLARY: 198 mg/dL — AB (ref 70–99)
Glucose-Capillary: 359 mg/dL — ABNORMAL HIGH (ref 70–99)

## 2017-10-06 LAB — PHOSPHORUS: Phosphorus: 2.7 mg/dL (ref 2.5–4.6)

## 2017-10-06 LAB — MAGNESIUM: Magnesium: 2 mg/dL (ref 1.7–2.4)

## 2017-10-06 MED ORDER — PREDNISONE 10 MG PO TABS: ORAL_TABLET | ORAL | Status: DC

## 2017-10-06 MED ORDER — GUAIFENESIN ER 600 MG PO TB12: 600.0000 mg | ORAL_TABLET | Freq: Two times a day (BID) | ORAL | 0 refills | Status: AC

## 2017-10-06 MED ORDER — ASPIRIN EC 81 MG PO TBEC
81.0000 mg | DELAYED_RELEASE_TABLET | Freq: Every day | ORAL | Status: AC
Start: 2017-10-06 — End: ?

## 2017-10-06 MED ORDER — OXYCODONE HCL 5 MG PO TABS: 2.5000 mg | ORAL_TABLET | ORAL | 0 refills | Status: DC | PRN

## 2017-10-06 MED ORDER — IPRATROPIUM-ALBUTEROL 0.5-2.5 (3) MG/3ML IN SOLN: 3.0000 mL | Freq: Four times a day (QID) | RESPIRATORY_TRACT | Status: AC | PRN

## 2017-10-06 NOTE — Progress Notes (Signed)
Speech Language Pathology Treatment: Dysphagia  Patient Details Name: Nina Hudson MRN: [id number] DOB: Mar 29, 1946 Today's Date: 10/06/2017 Time: 0825-0900 SLP Time Calculation (min) (ACUTE ONLY): 35 min  Assessment / Plan / Recommendation Clinical Impression  Pt seen for ongoing assessment of toleration of oral intake and thin liquids; meals of solids/mech soft foods. Pt's diet was upgraded to the full [place] w/ thin liquids yesterday - NO swallowing problems were noted by NSG or pt/friend since the upgrade. Pt continues to present w/weaknessbut is alert and follows through w/ tasks w/ min cues intermittently. Pt continues to present w/ strength in her vocal quality; less congested cough this morning.  Pt awakened to verbally respond and follow basic commands w/ SLP. Pt positioned more fully upright in the bed and given thin liquid trials VIA CUP. Pthelped tohold her own cup fordrinking as instructed to; education given to friend again. Pt consumedsips of water and juice w/ no immediate, overt s/s of aspiration noted.Pt was instructed, and she followed through, w/ taking SMALL, SINGLE sips from the Cup - SLOWLY. Pt then used a straw (as was noted in a cup on her tray table) w/ similar results. Clear vocal quality and no decline in respiratory status noted during/post trials. Pt then consumed trials of soft solids/Mech Soft foods w/ adequate oral phase bolus management noted - mastication and A-P transfer of the trials was appropriate and timely. Oral clearing noted b/t trials w/ min extra time - pt gave a verbal cue when she was ready to take another bite. Pt consumed ~10 boluses alternating b/t foods and liquids w/ support feeding given during the trials.  Pt and friend were educated on how to HOLD the Utensil w/ the Grip slid onto the handle of her utensil (OT provided) and the Cup together giving support to pt - it is important for pt to hold her own Cup also when drinking for safer  swallowing/intake.  Thorough education given on following the aspiration precautions as given/posted - especially using a CUP for drinking (NO STRAWS IF COUGHING NOTED). Recommended continue the diet consistency ofMech Softfoods (for conservation of energy of mastication d/t quick fatigue) and thin liquids w/ rest breaks as needed; monitoring and assistance at meals as pt is easilyfatigued(hand over hand feeding); Pills given IN PUREE. Pt/friend agreed. NSG updated. No further skilled ST Services indicated at this time. NSG to reconsult if needed. Of note, thorough education and instruction/model given on using the Incentive Spirometer and noted the Flutter Valve to improve Pulmonary status/strength.     HPI   Ptis a72 y.o.femalewith a known history of diffuse large B cell lymphoma, uterine cancer, history of a fib on eliquis, diabetes, comes to the emergency room from [facility] where she was admitted on 09/18/17. Patient is being admitted type for acute on chronic hypoxic respiratory failure secondary to suspected pneumonia/G.I. bleed/acute on chronic anemia. Patient was intubated after admission. Extubated w/ success; NG tube in place for nutrition support initially then removed yesterday to allow pt to have hunger cues/desire to eat orally hopefully. Palliative Care is following pt and family for goals of care.      SLP Plan  All goals met       Recommendations  Diet recommendations: Dysphagia 3 (mechanical soft);Thin liquid(easier mastication for energy conservation) Liquids provided via: Cup;Straw(monitor straw use) Medication Administration: Whole meds with puree(for safer swallowing) Supervision: Patient able to self feed;Full supervision/cueing for compensatory strategies;Staff to assist with self feeding(d/t overall weakness) Compensations: Minimize environmental  distractions;Slow rate;Small sips/bites;Lingual sweep for clearance of pocketing;Multiple dry swallows after each  bite/sip;Follow solids with liquid Postural Changes and/or Swallow Maneuvers: Seated upright 90 degrees;Upright 30-60 min after meal                General recommendations: (Dietician f/u) Oral Care Recommendations: Oral care BID;Staff/trained caregiver to provide oral care;Patient independent with oral care Follow up Recommendations: Skilled Nursing facility(TBD by PT) SLP Visit Diagnosis: Dysphagia, unspecified (R13.10)(deconditioned status overall) Plan: All goals met       GO               Orinda Kenner, MS, CCC-SLP Watson,Katherine 10/06/2017, 10:39 AM

## 2017-10-06 NOTE — Progress Notes (Signed)
Attempted to call report to [facility], no answer, x 3. Will try again at later time.

## 2017-10-06 NOTE — Progress Notes (Signed)
PT Cancellation Note  Patient Details Name: Nina Hudson MRN: [id number] DOB: 10/19/1945   Cancelled Treatment:    Reason Eval/Treat Not Completed: Patient declined, no reason specified   Pt offered and encouraged session x 2 this am.  On first attempt, pt tired from breakfast and requested later session.  Returned later and she declined "Not today"  Encouraged supine exercises but she again declined.    Chesley Noon 10/06/2017, 11:36 AM

## 2017-10-06 NOTE — Plan of Care (Signed)
  Problem: Clinical Measurements: Goal: Respiratory complications will improve Outcome: Adequate for Discharge   Problem: Nutrition: Goal: Adequate nutrition will be maintained Outcome: Adequate for Discharge   Problem: Elimination: Goal: Will not experience complications related to bowel motility Outcome: Adequate for Discharge   Problem: Respiratory: Goal: Ability to maintain adequate ventilation will improve Outcome: Adequate for Discharge   Problem: Respiratory: Goal: Ability to maintain a clear airway will improve Outcome: Adequate for Discharge

## 2017-10-06 NOTE — Progress Notes (Signed)
Report given to [facility] at [facility]. EMS notified for transport.

## 2017-10-06 NOTE — Progress Notes (Signed)
Pt discharged via EMS to [facility]. VSS, pt axox4 IV removed. Pt in good spirits eating supper at time of discharge. Husband Clance Boll notified of pt transport.

## 2017-10-06 NOTE — Progress Notes (Signed)
Pharmacy Electrolyte Monitoring Consult:  Pharmacy consulted to assist in monitoring and replacing electrolytes in this 72 y.o. female admitted on 09/23/2017 with PNA  Labs:  Sodium (mmol/L)  Date Value  10/06/2017 140  04/11/2014 138   Potassium (mmol/L)  Date Value  10/06/2017 4.1  04/11/2014 3.9   Magnesium (mg/dL)  Date Value  10/06/2017 2.0  06/07/2012 1.8   Phosphorus (mg/dL)  Date Value  10/06/2017 2.7  01/16/2012 3.0   Calcium (mg/dL)  Date Value  10/06/2017 9.4   Calcium, Total (mg/dL)  Date Value  04/11/2014 8.1 (L)   Albumin (g/dL)  Date Value  09/25/2017 2.3 (L)  03/01/2013 3.4   Assessment/Plan: K, Mag, and Phos at goal. No supplementation at this time.  Will recheck in 2 days on 10/06/17. Note Dysphagia 1 diet, crushing meds.  10/06/17 10:18 K 4.1, Mg 2, Phos 2.7, Ca 9.4. No additional supplement indicated at this point. Will recheck electrolytes with AM labs in 3 days.   Nina Hudson A. [place], [place].D., BCPS Clinical Pharmacist 10/06/2017

## 2017-10-06 NOTE — Discharge Summary (Signed)
[facility] [place] NAME: Nina Hudson    MR#:  [id number]  DATE OF BIRTH:  Apr 18, 1945  DATE OF ADMISSION:  09/23/2017 ADMITTING PHYSICIAN: Fritzi Mandes, MD  DATE OF DISCHARGE: 10/06/2017  PRIMARY CARE PHYSICIAN: Derinda Late, MD    ADMISSION DIAGNOSIS:  Acute respiratory failure with hypoxia ([place]) [J96.01] Altered mental status, unspecified altered mental status type [R41.82] Gastrointestinal hemorrhage, unspecified gastrointestinal hemorrhage type [K92.2]  DISCHARGE DIAGNOSIS:  Active Problems:   Acute respiratory failure with hypoxia (HCC)   Pressure injury of skin   Goals of care, counseling/discussion   Palliative care by specialist   SECONDARY DIAGNOSIS:   Past Medical History:  Diagnosis Date  . Arthritis   . Cancer (HCC)    diffuse large B cell lymphoma  . Cancer ([place])    uterine  . Depression   . Diabetes mellitus without complication ([place])   . Dysrhythmia    A-fib  . Hypercholesteremia   . Hypertension   . Stroke [facility])     HOSPITAL COURSE:   [name] a known history of diffuse large B cell lymphoma, uterine cancer, history of a fib on eliquis, diabetes, comes to the emergency room from [facility] due to acute on chronic hypoxic respiratory failure secondary to suspected pneumonia/G.I. bleed/acute on chronic anemia.  1.acute on chronic hypoxic respiratory failure secondary to right lower lobe pneumonia -Patient was initially admitted to the hospital and intubated but extubated on September 29, 2017.  She has clinically improved since then and is no longer hypoxic. -Patient was treated aggressively with IV antibiotics with Zosyn.  Patient underwent a CT scan of the chest which was negative for pulmonary embolism.  After treatment with IV antibiotics patient has clinically improved. - She is no longer hypoxic but still needs pulmonary toileting with Mucinex, duo nebs as needed inhalers.  She  has finished treatment for the underlying pneumonia with antibiotics.  She will not be discharged on any oral antibiotics presently.  2.Acute encephalopathy - metabolic in nature due to infection.  Patient was treated for her pneumonia as mentioned above.  Patient's mental status has slowly improved throughout the hospital course.  She is more awake and alert over the past 48 to 72 hours and continues to improve.  There was no evidence of seizure type activity and her EEG was negative while in the hospital. - pt's CT head on 09/24/17 was (-) for acute pathology.   3.acute on chronic anemia -  This was anemia of chronic disease. - Patient's Eliquis was stopped.  She was transfused 1 unit of packed red blood cells.  Patient's hemoglobin has remained since then.  She is no longer going to be on Eliquis given her high fall risk and risk for bleeding.  3.History of ERCP with stone removal and stent placement along with lap Chole complicated by cystic artery bleed requiring exploratory laparotomy - this was done at [facility] on 6/5 /2019.  -Patient's LFTs are stable and she has no acute abdominal pain or any severe acute surgical indication.  She is tolerating p.o. well without any nausea vomiting.    4.Diabetes - while in the hospital patient was on sliding scale insulin.  She is now being discharged back on her glipizide and metformin.  5.A. fib with RVR -patient had some labile heart rate secondary to her respiratory distress.  She was given pulse doses of IV digoxin along with her Cardizem.  Her heart rates have remained fairly  stable over the past 48 to 72 hours. -She was on beta-blockers but they were been discontinued due to relative hypotension.  She will continue Cardizem.  She is not on anticoagulation given her high fall risk with anemia.    6. Nutrition- post extubation patient was on tube feeds and continued to be quite encephalopathic and requiring nutritional supplements.  Over the  past 2 to 3 days patient's mental status has significantly improved.  A speech therapy consult was obtained and patient was initially started on a dysphagia 1 diet and now has been advanced to a dysphagia 3 diet with thin liquids which she is tolerating well. - She should continue to see a speech therapist at the skilled nursing facility.  Her diet can be advanced as tolerated.  Given her prolonged hospital course and multiple comorbidities, patient's prognosis is guarded.  Palliative care should continue to follow the patient at the skilled nursing facility.  DISCHARGE CONDITIONS:   Stable  CONSULTS OBTAINED:  Treatment Team:  Teodoro Spray, MD  DRUG ALLERGIES:   Allergies  Allergen Reactions  . Sulfa Antibiotics Other (See Comments)    "I get real achey" Other reaction(s): Muscle Pain, Other (See Comments)  General body aches andpain.     DISCHARGE MEDICATIONS:   Allergies as of 10/06/2017      Reactions   Sulfa Antibiotics Other (See Comments)   "I get real achey" Other reaction(s): Muscle Pain, Other (See Comments) General body aches andpain.      Medication List    STOP taking these medications   amoxicillin-clavulanate 875-125 MG tablet Commonly known as:  AUGMENTIN   ELIQUIS 5 MG Tabs tablet Generic drug:  apixaban   metoprolol tartrate 50 MG tablet Commonly known as:  LOPRESSOR     TAKE these medications   albuterol (2.5 MG/3ML) 0.083% nebulizer solution Commonly known as:  PROVENTIL Take 2.5 mg by nebulization every 6 (six) hours as needed for wheezing or shortness of breath.   aspirin EC 81 MG tablet Take 1 tablet (81 mg total) by mouth daily.   atorvastatin 40 MG tablet Commonly known as:  LIPITOR Take 40 mg by mouth daily.   carboxymethylcellulose 0.5 % Soln Commonly known as:  REFRESH PLUS Place 2 drops into both eyes 3 (three) times daily as needed (dry eyes).   diltiazem 120 MG 24 hr capsule Commonly known as:  CARDIZEM CD Take 120 mg  by mouth daily.   donepezil 5 MG tablet Commonly known as:  ARICEPT Take 5 mg by mouth every evening.   ferrous sulfate 325 (65 FE) MG tablet Take 325 mg by mouth 2 (two) times daily.   gabapentin 100 MG capsule Commonly known as:  NEURONTIN Take 100 mg by mouth every evening.   glipiZIDE 5 MG 24 hr tablet Commonly known as:  GLUCOTROL XL Take 5 mg by mouth daily.   guaiFENesin 600 MG 12 hr tablet Commonly known as:  MUCINEX Take 1 tablet (600 mg total) by mouth 2 (two) times daily for 7 days.   ipratropium-albuterol 0.5-2.5 (3) MG/3ML Soln Commonly known as:  DUONEB Take 3 mLs by nebulization every 6 (six) hours as needed.   magnesium oxide 400 MG tablet Commonly known as:  MAG-OX Take 800 mg by mouth daily.   Melatonin 3 MG Tabs Take 3 mg by mouth at bedtime as needed (sleep).   metFORMIN 500 MG tablet Commonly known as:  GLUCOPHAGE Take 500 mg by mouth 2 (two) times daily.  oxyCODONE 5 MG immediate release tablet Commonly known as:  Oxy IR/ROXICODONE Take 0.5 tablets (2.5 mg total) by mouth every 4 (four) hours as needed for severe pain.   polyethylene glycol packet Commonly known as:  MIRALAX / GLYCOLAX Take 17 g by mouth daily.   potassium chloride 10 MEQ tablet Commonly known as:  K-DUR,KLOR-CON Take 10 mEq by mouth daily.   predniSONE 10 MG tablet Commonly known as:  DELTASONE Label  & dispense according to the schedule below.  4 Pills PO for 1 day, 3 Pills PO for 1 day, 2 Pills PO for 1 day, 1 Pill PO for 1 days then STOP.   torsemide 10 MG tablet Commonly known as:  DEMADEX Take 10 mg by mouth daily.   vitamin C 500 MG tablet Commonly known as:  ASCORBIC ACID Take 500 mg by mouth 2 (two) times daily. (take with ferrous sulfate tablets)         DISCHARGE INSTRUCTIONS:   DIET:  Cardiac diet and Diabetic diet  Mech Soft diet w/ Thin liquids; general aspiration precautions; NO Large Straws when drinking liquids; monitor any use of straws.  Pills given Whole in Puree. Feeding Assistance at meals d/t weakness.   DISCHARGE CONDITION:  Stable  ACTIVITY:  Activity as tolerated  OXYGEN:  Home Oxygen: No.   Oxygen Delivery: room air  DISCHARGE LOCATION:  nursing home   If you experience worsening of your admission symptoms, develop shortness of breath, life threatening emergency, suicidal or homicidal thoughts you must seek medical attention immediately by calling 911 or calling your MD immediately  if symptoms less severe.  You Must read complete instructions/literature along with all the possible adverse reactions/side effects for all the Medicines you take and that have been prescribed to you. Take any new Medicines after you have completely understood and accpet all the possible adverse reactions/side effects.   Please note  You were cared for by a hospitalist during your hospital stay. If you have any questions about your discharge medications or the care you received while you were in the hospital after you are discharged, you can call the unit and asked to speak with the hospitalist on call if the hospitalist that took care of you is not available. Once you are discharged, your primary care physician will handle any further medical issues. Please note that NO REFILLS for any discharge medications will be authorized once you are discharged, as it is imperative that you return to your primary care physician (or establish a relationship with a primary care physician if you do not have one) for your aftercare needs so that they can reassess your need for medications and monitor your lab values.     Today   No acute events overnight, remains alert and awake and follows commands.  P.o. intake is improving.  Still has a raspy dry cough but no other complaints.  Will discharge to skilled nursing facility today.  VITAL SIGNS:  Blood pressure 111/73, pulse 85, temperature 98.2 F (36.8 C), temperature source Oral, resp. rate 18,  height 5\' 5"  (1.651 m), weight 60.1 kg (132 lb 7.9 oz), SpO2 92 %.  I/O:    Intake/Output Summary (Last 24 hours) at 10/06/2017 1430 Last data filed at 10/06/2017 0900 Gross per 24 hour  Intake 120 ml  Output 800 ml  Net -680 ml    PHYSICAL EXAMINATION:   GENERAL:  72 y.o.-year-old patient lying in bed in no acute distress.  EYES: Pupils equal, round, reactive  to light and accommodation. No scleral icterus. Extraocular muscles intact.  HEENT: Head atraumatic, normocephalic. Oropharynx and nasopharynx clear.  NECK:  Supple, no jugular venous distention. No thyroid enlargement, no tenderness.  LUNGS: Normal breath sounds bilaterally, no wheezing, rales, + upper airway rhonchi. No use of accessory muscles of respiration.  CARDIOVASCULAR: S1, S2 normal. No murmurs, rubs, or gallops.  ABDOMEN: Soft, nontender, nondistended. Bowel sounds present. No organomegaly or mass.  EXTREMITIES: No cyanosis, clubbing, + 1 dependent edema b/l.    NEUROLOGIC: Cranial nerves II through XII are intact. No focal Motor or sensory deficits b/l.  Globally weak.  PSYCHIATRIC: The patient is alert and oriented x 2.  SKIN: No obvious rash, lesion, or ulcer.    DATA REVIEW:   CBC Recent Labs  Lab 10/02/17 0738  WBC 9.6  HGB 11.1*  HCT 32.5*  PLT 220    Chemistries  Recent Labs  Lab 10/06/17 1018  NA 140  K 4.1  CL 99  CO2 34*  GLUCOSE 265*  BUN 30*  CREATININE 0.91  CALCIUM 9.4  MG 2.0    Cardiac Enzymes No results for input(s): TROPONINI in the last 168 hours.  Microbiology Results  Results for orders placed or performed during the hospital encounter of 09/23/17  Blood Culture (routine x 2)     Status: None   Collection Time: 09/23/17  1:26 PM  Result Value Ref Range Status   Specimen Description BLOOD LEFT ANTECUBITAL  Final   Special Requests   Final    BOTTLES DRAWN AEROBIC AND ANAEROBIC Blood Culture results may not be optimal due to an excessive volume of blood received in  culture bottles   Culture   Final    NO GROWTH 5 DAYS Performed at [facility], [address], [place], [place] [address]    Report Status 09/28/2017 FINAL  Final  Blood Culture (routine x 2)     Status: None   Collection Time: 09/23/17  1:27 PM  Result Value Ref Range Status   Specimen Description BLOOD RIGHT ANTECUBITAL  Final   Special Requests   Final    BOTTLES DRAWN AEROBIC AND ANAEROBIC Blood Culture adequate volume   Culture   Final    NO GROWTH 5 DAYS Performed at [facility], [address], [place], [place] [address]    Report Status 09/28/2017 FINAL  Final  Urine culture     Status: None   Collection Time: 09/23/17  1:27 PM  Result Value Ref Range Status   Specimen Description   Final    URINE, RANDOM Performed at [facility], [address] [place], [place] [address]    Special Requests   Final    NONE Performed at [facility], [address] [place], [place] [address]    Culture   Final    NO GROWTH Performed at [facility], [place] [address] [place], [place] [address]    Report Status 09/24/2017 FINAL  Final  Culture, respiratory (NON-Expectorated)     Status: None   Collection Time: 09/23/17  2:11 PM  Result Value Ref Range Status   Specimen Description   Final    TRACHEAL ASPIRATE Performed at [facility], [address], [place], [place] [address]    Special Requests   Final    NONE Performed at [facility], [address] [place], [place] [address]    Gram Stain   Final    ABUNDANT WBC PRESENT,  PREDOMINANTLY PMN FEW GRAM POSITIVE COCCI RARE GRAM POSITIVE RODS RARE GRAM NEGATIVE COCCOBACILLI Performed at [facility], [place] [address], [place], [place] [address]    Culture MODERATE PSEUDOMONAS AERUGINOSA  Final   Report Status 09/27/2017 FINAL  Final   Organism ID, Bacteria PSEUDOMONAS AERUGINOSA  Final      Susceptibility   Pseudomonas aeruginosa - MIC*     CEFTAZIDIME 2 SENSITIVE Sensitive     CIPROFLOXACIN <=0.25 SENSITIVE Sensitive     GENTAMICIN <=1 SENSITIVE Sensitive     IMIPENEM <=0.25 SENSITIVE Sensitive     PIP/TAZO <=4 SENSITIVE Sensitive     CEFEPIME <=1 SENSITIVE Sensitive     * MODERATE PSEUDOMONAS AERUGINOSA  MRSA PCR Screening     Status: None   Collection Time: 09/23/17  6:17 PM  Result Value Ref Range Status   MRSA by PCR NEGATIVE NEGATIVE Final    Comment:        The GeneXpert MRSA Assay (FDA approved for NASAL specimens only), is one component of a comprehensive MRSA colonization surveillance program. It is not intended to diagnose MRSA infection nor to guide or monitor treatment for MRSA infections. Performed at [facility], [address] [place], [place] [address]   Culture, respiratory (NON-Expectorated)     Status: None   Collection Time: 09/23/17  8:59 PM  Result Value Ref Range Status   Specimen Description   Final    TRACHEAL ASPIRATE Performed at [facility] [address] [place], [place] [address]    Special Requests   Final    Normal Performed at [facility], [address] [place], [place] [address]    Gram Stain   Final    FEW WBC PRESENT, PREDOMINANTLY PMN RARE GRAM POSITIVE COCCI    Culture   Final    Consistent with normal respiratory flora. Performed at [facility], [place] [address], [place], [place] [address]    Report Status 09/26/2017 FINAL  Final    RADIOLOGY:  No results found.    Management plans discussed with the patient, family and they are in agreement.  CODE STATUS:     Code Status Orders  (From admission, onward)        Start     Ordered   09/23/17 1815  Full code  Continuous     09/23/17 1814    Code Status History    Date Active Date Inactive Code Status Order ID Comments User Context   09/28/2015 0939 09/28/2015 1325 Full Code [id number]  Dereck Leep, MD Inpatient      TOTAL TIME TAKING CARE OF THIS  PATIENT: 45 minutes.    Henreitta Leber M.D on 10/06/2017 at 2:30 PM  Between 7am to 6pm - Pager - [phone]  After 6pm go to [email or web address] - Proofreader  [facility] [place] [facility]  Office  [phone]  CC: Primary care physician; Derinda Late, MD

## 2017-10-06 NOTE — NC FL2 (Signed)
[facility] LEVEL OF CARE SCREENING TOOL     IDENTIFICATION  Patient Name: Nina Hudson Birthdate: 22-Dec-1945 Sex: female Admission Date (Current Location): 09/23/2017  [place] and [place] Number:  Engineering geologist and Address:  [facility], [address], [place], [place] [address]      Provider Number: [id number]  Attending Physician Name and Address:  Henreitta Leber, MD  Relative Name and Phone Number:       Current Level of Care: Hospital Recommended Level of Care: [place] Prior Approval Number:    Date Approved/Denied:   PASRR Number: [id number] E expires on 7/12 /19  Discharge Plan: SNF    Current Diagnoses: Patient Active Problem List   Diagnosis Date Noted  . Goals of care, counseling/discussion   . Palliative care by specialist   . Pneumonia 09/23/2017  . Altered mental status 09/23/2017  . Acute respiratory failure with hypoxia ([place]) 09/23/2017  . Pressure injury of skin 09/23/2017    Orientation RESPIRATION BLADDER Height & Weight     Self, Place  O2(3L) Incontinent Weight: 132 lb 7.9 oz (60.1 kg) Height:  5\' 5"  (165.1 cm)  BEHAVIORAL SYMPTOMS/MOOD NEUROLOGICAL BOWEL NUTRITION STATUS  (none)   Incontinent Diet(Dysphagia 3)  AMBULATORY STATUS COMMUNICATION OF NEEDS Skin   Limited Assist Verbally Surgical wounds                       Personal Care Assistance Level of Assistance  Bathing, Feeding, Dressing Bathing Assistance: Limited assistance Feeding assistance: Independent Dressing Assistance: Limited assistance Total Care Assistance: Limited assistance   Functional Limitations Info  Sight, Hearing, Speech Sight Info: Adequate Hearing Info: Adequate Speech Info: Adequate    SPECIAL CARE FACTORS FREQUENCY  PT (By licensed PT), OT (By licensed OT)     PT Frequency: 5x a week OT Frequency: 5x a week            Contractures Contractures Info: Not present     Additional Factors Info  Code Status, Allergies, Insulin Sliding Scale, Psychotropic Code Status Info: Full Code Allergies Info: SULFA ANTIBIOTICS Psychotropic Info: traZODone (DESYREL) tablet 50 mg  Insulin Sliding Scale Info: insulin aspart (novoLOG) injection 0-15 Units 3x a day with meals       Current Medications (10/06/2017):  This is the current hospital active medication list Current Facility-Administered Medications  Medication Dose Route Frequency Provider Last Rate Last Dose  . acetaminophen (TYLENOL) tablet 650 mg  650 mg Oral Q6H PRN Fritzi Mandes, MD       Or  . acetaminophen (TYLENOL) suppository 650 mg  650 mg Rectal Q6H PRN Fritzi Mandes, MD      . budesonide (PULMICORT) nebulizer solution 0.5 mg  0.5 mg Nebulization BID Arta Silence, MD   0.5 mg at 10/06/17 0726  . diltiazem (CARDIZEM) tablet 60 mg  60 mg Oral Q6H Teodoro Spray, MD   60 mg at 10/06/17 1146  . feeding supplement (ENSURE ENLIVE) (ENSURE ENLIVE) liquid 237 mL  237 mL Oral BID BM Henreitta Leber, MD   237 mL at 10/06/17 1035  . glipiZIDE (GLUCOTROL) tablet 5 mg  5 mg Oral QAC breakfast Lafayette Dragon, MD   5 mg at 10/06/17 0815  . guaiFENesin (MUCINEX) 12 hr tablet 600 mg  600 mg Oral BID Henreitta Leber, MD   600 mg at 10/06/17 0815  . insulin aspart (novoLOG) injection 0-15 Units  0-15 Units Subcutaneous TID AC &  HS Lance Coon, MD   3 Units at 10/06/17 1145  . ipratropium-albuterol (DUONEB) 0.5-2.5 (3) MG/3ML nebulizer solution 3 mL  3 mL Nebulization Q6H PRN Awilda Bill, NP   3 mL at 09/30/17 0610  . ipratropium-albuterol (DUONEB) 0.5-2.5 (3) MG/3ML nebulizer solution 3 mL  3 mL Nebulization Q6H Arta Silence, MD   3 mL at 10/06/17 1327  . MEDLINE mouth rinse  15 mL Mouth Rinse BID Lafayette Dragon, MD   15 mL at 10/06/17 1035  . multivitamin with minerals tablet 1 tablet  1 tablet Oral Daily Vaughan Basta, MD   1 tablet at 10/06/17 0815  . nystatin (MYCOSTATIN) 100000  UNIT/ML suspension 500,000 Units  5 mL Mouth/Throat QID Lance Coon, MD   500,000 Units at 10/06/17 [phone]  . pantoprazole sodium (PROTONIX) 40 mg/20 mL oral suspension 40 mg  40 mg Oral BID Henreitta Leber, MD   40 mg at 10/06/17 1035  . polyethylene glycol (MIRALAX / GLYCOLAX) packet 17 g  17 g Oral Daily PRN Lafayette Dragon, MD      . Derrill Memo ON 10/07/2017] predniSONE (DELTASONE) tablet 40 mg  40 mg Oral Q breakfast Henreitta Leber, MD       Followed by  . [START ON 10/08/2017] predniSONE (DELTASONE) tablet 30 mg  30 mg Oral Q breakfast Henreitta Leber, MD       Followed by  . [START ON 10/09/2017] predniSONE (DELTASONE) tablet 20 mg  20 mg Oral Q breakfast Henreitta Leber, MD       Followed by  . [START ON 10/10/2017] predniSONE (DELTASONE) tablet 10 mg  10 mg Oral Q breakfast Sainani, Belia Heman, MD      . sennosides (SENOKOT) 8.8 MG/5ML syrup 5 mL  5 mL Per Tube BID PRN Awilda Bill, NP      . sodium chloride flush (NS) 0.9 % injection 3 mL  3 mL Intravenous Q12H Henreitta Leber, MD   3 mL at 10/06/17 1036  . traZODone (DESYREL) tablet 50 mg  50 mg Oral QHS Salary, Holly Bodily D, MD   50 mg at 10/05/17 2037  . vitamin C (ASCORBIC ACID) tablet 250 mg  250 mg Oral BID Henreitta Leber, MD   250 mg at 10/06/17 2800     Discharge Medications: Please see discharge summary for a list of discharge medications.  Relevant Imaging Results:  Relevant Lab Results:   Additional Information SSN [id number]  Ross Ludwig, [place]

## 2017-10-06 NOTE — Progress Notes (Signed)
Chaplain was rounding and checked pt for spiritual needs . She desired none.    10/06/17 1100  Clinical Encounter Type  Visited With Patient  Visit Type Follow-up

## 2017-10-06 NOTE — Clinical Social Work Note (Signed)
CSW spoke with the patient's husband Clance Boll [phone] and informed him that patient is medically ready for discharge back to SNF per physician.  Patient's husband expressed that he was concerned that she is still very weak, CSW spoke told husband that is the reason she will be going to SNF to continue to receive therapy.  CSW expressed that patient will get more therapy at SNF verse at hospital, patient's husband expressed understanding.  CSW discussed with patient's husband that palliative will follow at SNF in case patient starts to deteriorate again.  Patient's husband stated he is nervous about her going to SNF, because she was not there very long and had to be readmitted to hospital.  Patient's husband is hopeful that she will participate with therapy and make progress in order to return back home.  CSW received phone call from patient's son Merry Proud who was asking for an updated on patient's progress and discharge plan to [facility].  Patient's son expressed understanding and that patient is discharging back to [facility] today.  Patient's son stated that he is going to try to encourage his mom to participate with therapy as well in order for her to get well enough to hopefully return back home.    Patient to be d/c'ed today to [facility].  Patient and family agreeable to plans will transport via ems RN to call report [phone].  Patient's husband and son are aware that patient is discharging today.  Jones Broom. [place], MSW, [place]  10/06/2017 4:59 PM

## 2017-10-06 NOTE — Progress Notes (Addendum)
Clinical Social Worker (CSW) received a call from patient's husband [name] on Monday 10/05/17 at 6:35 pm. Husband wanted to know if patient can return to [facility]. CSW made husband aware that as of last week [facility] could accept her. Husband also had concerns about patient discharging too early. CSW provided emotional support.   CSW confirmed with [facility] admissions coordinator at [facility] today that they can accept patient. CSW attempted to contact husband this morning 2 times however he did not answer and his voicemail was full.   [employer], LCSW [phone]

## 2017-10-07 ENCOUNTER — Encounter: Payer: Self-pay | Admitting: Adult Health

## 2017-10-07 ENCOUNTER — Other Ambulatory Visit: Payer: Self-pay

## 2017-10-07 ENCOUNTER — Non-Acute Institutional Stay (SKILLED_NURSING_FACILITY): Payer: Medicare Other | Admitting: Adult Health

## 2017-10-07 DIAGNOSIS — I482 Chronic atrial fibrillation, unspecified: Secondary | ICD-10-CM

## 2017-10-07 DIAGNOSIS — R0902 Hypoxemia: Secondary | ICD-10-CM

## 2017-10-07 DIAGNOSIS — D500 Iron deficiency anemia secondary to blood loss (chronic): Secondary | ICD-10-CM | POA: Diagnosis not present

## 2017-10-07 DIAGNOSIS — E1149 Type 2 diabetes mellitus with other diabetic neurological complication: Secondary | ICD-10-CM | POA: Insufficient documentation

## 2017-10-07 DIAGNOSIS — E1140 Type 2 diabetes mellitus with diabetic neuropathy, unspecified: Secondary | ICD-10-CM

## 2017-10-07 DIAGNOSIS — R600 Localized edema: Secondary | ICD-10-CM | POA: Diagnosis not present

## 2017-10-07 DIAGNOSIS — J449 Chronic obstructive pulmonary disease, unspecified: Secondary | ICD-10-CM

## 2017-10-07 DIAGNOSIS — E43 Unspecified severe protein-calorie malnutrition: Secondary | ICD-10-CM

## 2017-10-07 DIAGNOSIS — F0150 Vascular dementia without behavioral disturbance: Secondary | ICD-10-CM | POA: Insufficient documentation

## 2017-10-07 DIAGNOSIS — K5909 Other constipation: Secondary | ICD-10-CM

## 2017-10-07 MED ORDER — OXYCODONE HCL 5 MG PO TABS: 2.5000 mg | ORAL_TABLET | ORAL | 0 refills | Status: AC | PRN

## 2017-10-07 NOTE — Progress Notes (Signed)
Location:   [place] Room Number: [id number] Place of Service:  SNF (31)   CODE STATUS: Full Code  Allergies  Allergen Reactions  . Sulfa Antibiotics Other (See Comments)    "I get real achey" Other reaction(s): Muscle Pain, Other (See Comments)  General body aches andpain.     Chief Complaint  Patient presents with  . Acute Visit    patient status     HPI:  She is lethargic is unable to participate in the hpi or ros. She has had a recent hospitalization for pneumonia; gall bladder disease and sepsis. She is here for short term rehab. Her goal is to return back home. I'm not certain if going home will be an option at this time. Her appetite has been poor. There are no signs of respiratory disease; no signs of aspiration present. She does have weeping present in her upper extremities. Her pre-albumin is 10.3. I have discussed with her son that she is severely malnourished and will require supplements to help improve upon her nutritional status. I have spoken with speech therapy and she will more than likely require a pureed diet. There are no reports of fevers present.    Past Medical History:  Diagnosis Date  . Arthritis   . Cancer (HCC)    diffuse large B cell lymphoma  . Cancer ([place])    uterine  . Depression   . Diabetes mellitus without complication ([place])   . Dysrhythmia    A-fib  . Hypercholesteremia   . Hypertension   . Stroke [facility])     Past Surgical History:  Procedure Laterality Date  . ABDOMINAL HYSTERECTOMY    . APPENDECTOMY    . BUNIONECTOMY Bilateral   . FRACTURE SURGERY Right    ankle  . MASS EXCISION N/A 09/28/2015   Procedure: EXCISION OF 2 CYSTIC MASSES FROM MEDIAL STERNUM;  Surgeon: Dereck Leep, MD;  Location: [facility] ORS;  Service: Orthopedics;  Laterality: N/A;  . rheumatoid nodual removal    . TUBAL LIGATION      Social History   Socioeconomic History  . Marital status: Married    Spouse name: Not on file  .  Number of children: Not on file  . Years of education: Not on file  . Highest education level: Not on file  Occupational History  . Not on file  Social Needs  . Financial resource strain: Not on file  . Food insecurity:    Worry: Not on file    Inability: Not on file  . Transportation needs:    Medical: Not on file    Non-medical: Not on file  Tobacco Use  . Smoking status: Former Smoker    Last attempt to quit: 09/12/2007    Years since quitting: 10.0  . Smokeless tobacco: Never Used  Substance and Sexual Activity  . Alcohol use: Yes    Comment: 1-2 glass of wine a month.  . Drug use: No  . Sexual activity: Not Currently  Lifestyle  . Physical activity:    Days per week: Not on file    Minutes per session: Not on file  . Stress: Not on file  Relationships  . Social connections:    Talks on phone: Not on file    Gets together: Not on file    Attends religious service: Not on file    Active member of club or organization: Not on file    Attends meetings of clubs or organizations: Not  on file    Relationship status: Not on file  . Intimate partner violence:    Fear of current or ex partner: Not on file    Emotionally abused: Not on file    Physically abused: Not on file    Forced sexual activity: Not on file  Other Topics Concern  . Not on file  Social History Narrative  . Not on file   History reviewed. No pertinent family history.    VITAL SIGNS BP 117/64   Pulse 90   Temp 98.3 F (36.8 C)   Resp 20   Ht _0  (1.702 m)   Wt 138 lb 9.6 oz (62.9 kg)   SpO2 96%   BMI 21.71 kg/m   Outpatient Encounter Medications as of 10/07/2017  Medication Sig  . albuterol (PROVENTIL) (2.5 MG/3ML) 0.083% nebulizer solution Take 2.5 mg by nebulization every 6 (six) hours as needed for wheezing or shortness of breath.  Marland Kitchen aspirin EC 81 MG tablet Take 1 tablet (81 mg total) by mouth daily.  Marland Kitchen atorvastatin (LIPITOR) 40 MG tablet Take 40 mg by mouth daily.  .  carboxymethylcellulose (REFRESH PLUS) 0.5 % SOLN Place 2 drops into both eyes 3 (three) times daily as needed (dry eyes).  Marland Kitchen diltiazem (CARDIZEM CD) 120 MG 24 hr capsule Take 120 mg by mouth daily.  Marland Kitchen donepezil (ARICEPT) 5 MG tablet Take 5 mg by mouth every evening.  . ferrous sulfate 325 (65 FE) MG tablet Take 325 mg by mouth 2 (two) times daily.  Marland Kitchen glipiZIDE (GLUCOTROL XL) 5 MG 24 hr tablet Take 5 mg by mouth daily.   Marland Kitchen guaiFENesin (MUCINEX) 600 MG 12 hr tablet Take 1 tablet (600 mg total) by mouth 2 (two) times daily for 7 days.  Marland Kitchen ipratropium-albuterol (DUONEB) 0.5-2.5 (3) MG/3ML SOLN Take 3 mLs by nebulization every 6 (six) hours as needed.  . magnesium oxide (MAG-OX) 400 MG tablet Take 800 mg by mouth daily.  . Melatonin 3 MG TABS Take 3 mg by mouth at bedtime as needed (sleep).  . metFORMIN (GLUCOPHAGE) 500 MG tablet Take 500 mg by mouth 2 (two) times daily.  Marland Kitchen oxyCODONE (OXY IR/ROXICODONE) 5 MG immediate release tablet Take 0.5 tablets (2.5 mg total) by mouth every 4 (four) hours as needed for severe pain.  . OXYGEN Inhale 3 L/min into the lungs continuous.  . polyethylene glycol (MIRALAX / GLYCOLAX) packet Take 17 g by mouth daily.  . potassium chloride (K-DUR,KLOR-CON) 10 MEQ tablet Take 10 mEq by mouth daily.  Marland Kitchen torsemide (DEMADEX) 10 MG tablet Take 10 mg by mouth daily.  . vitamin C (ASCORBIC ACID) 500 MG tablet Take 500 mg by mouth 2 (two) times daily. (take with ferrous sulfate tablets)  . [DISCONTINUED] gabapentin (NEURONTIN) 100 MG capsule Take 100 mg by mouth every evening.  . [DISCONTINUED] predniSONE (DELTASONE) 10 MG tablet Label  & dispense according to the schedule below.  4 Pills PO for 1 day, 3 Pills PO for 1 day, 2 Pills PO for 1 day, 1 Pill PO for 1 days then STOP. (Patient not taking: Reported on 10/07/2017)   No facility-administered encounter medications on file as of 10/07/2017.      SIGNIFICANT DIAGNOSTIC EXAMS  TODAY:   09-23-17: ct of head: No acute abnormality.  Atrophy and chronic microvascular ischemic change. Sinus disease.  09-23-17:  Ct angio of chest; 1. Moderate RIGHT and small LEFT pleural effusions. Bibasilar atelectasis and/or pneumonia. 2. Bronchial wall thickening seen with bronchitis. Scattered  tree-in-bud infiltrates may be infectious or inflammatory. 3. Partially imaged biliary stent with pneumobilia and debris within gallbladder fossa. Aortic Atherosclerosis (ICD10-I70.0) and Emphysema (ICD10-J43.9).   09-24-17: ct of abdomen and pelvis: 1. Small amount of perihepatic and pelvic ascites. Attenuation above simple fluid suggests blood, contrast material, or proteinaceous component. 2. Stable placement of patent metallic biliary stent. 3. Fecal distention of the rectum with regional inflammatory/edematous changes, suggesting possible stercoral proctitis. 4. Sigmoid diverticulosis. 5. Lower thoracic and lumbar mild compression deformities, age indeterminate 6. Ectatic abdominal aorta at risk for aneurysm development. Recommend followup by ultrasound in 5 years.    09-29-17: chest x-ray: Worsening atelectasis or pneumonia at the right lung base with small right pleural effusion. Underlying COPD. No CHF.    LABS REVIEWED: TODAY:   09-21-17: wbc 5.6; hgb 9.0; hct 27.8; mcv 94.;7 plt 141;glucose 256; bun 20; creat 0.70; k+ 3.2; na++ 142; ca 8.5; alk phos 140; total bili 2.2; albumin 2.8; tsh 3.450; vit D 30.7; vit B 12: 603 09-23-17: wbc 6.8; hgb 8.4; hct 26.1; mcv 96.5; plt 163; glucose 313; bun 20; creat 0.70; k+ 4.8; na++ 142; ca 8.2; alk phos 132; total bili 2.2; albumin 2.9; blood/urine culture: no growth BNP 1095; mag 1.9; phos 2.3 09-25-17: glucose 199; bun 17; creat 0.65; k+ 3.7; na++ 140; ca 7.6; alk hpos 111; total bili 1.7; albumin 2.3 09-28-17: glucose 211; bun 23; creat 0.46; k+ 3.5; na++ 138; ca 7.9 09-30-17: wbc 6.3; hgb 11.8; hct 36.4; mcv 92.9; plt 162; glucose 117; bun 26; creat 0.56; k+ 4.8; na++ 139; ca 8.6; phos 3.0; pre-albumin  10.3  10-06-17: glucose 265; bun 30; creat 0.91; k+ 4.1; na++ 140; ca 9.4; phos 2.7; mag 2.0    Review of Systems  Unable to perform ROS: Other (lethargic )    Physical Exam  Constitutional: She appears well-developed and well-nourished. No distress.  Neck: No thyromegaly present.  Cardiovascular: Normal rate, regular rhythm, normal heart sounds and intact distal pulses.  Pulmonary/Chest: Effort normal. No respiratory distress.  02 dependent Breath sounds diminished   Abdominal: Soft. Bowel sounds are normal. She exhibits no distension. There is no tenderness.  Musculoskeletal: She exhibits edema.  Has bilateral upper extremity edema with weeping present  Is able to move extremities is very weak   Lymphadenopathy:    She has no cervical adenopathy.  Neurological:  Aware is lethargic   Skin: Skin is warm and dry. She is not diaphoretic.  Abdominal incision line without signs of infection present Has bruising on the right side of abdomen.      ASSESSMENT/ PLAN:  TODAY:   1. COPD  with hypoxia: is stable: will continue 02 at 3L/[place]. Will continue duoneb every 6 hours as needed and aluterol neb every 6 hours as needed. I have instructed her family on using the I/S and flutter valve 2-3 times daily; did verbalize understanding.   2.  Chronic Afib: heart rate is stable: will continue cardizem CD 120 mg daily and asa 81 mg daily  3. Dyslipidemia: is stable will continue lipitor 40 mg daily   4. Chronic constipation: stable will continue miralax daily   5. Anemia  blood loss : stable hgb 11.8: will continue iron twice daily   6. Bilateral lower extremity edema: stable will continue demadex 10 mg daily with k+ 10 meq daily   7. Vascular dementia without behavioral disturbance: is without change: weight is 138 pounds; will continue aricept 5 mg daily   8. Hypomagnesemia: stable  mag 1.9 will continue mag ox 800 mg daily   9.  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 Severe protein-calorie malnutrition: is without change: albumin 2.3; pre-albumin 10.3: will begin magic cup three times daily and promod 1 scoop twice daily       MD is aware of resident's narcotic use and is in agreement with current plan of care. We will attempt to wean resident as apropriate   Ok Edwards NP [facility]  Contact [phone] Monday through Friday 8am- 5pm  After hours call [phone]

## 2017-10-07 NOTE — Telephone Encounter (Signed)
Rx sent to [facility] phone : [phone] , fax : [phone]  

## 2017-10-12 ENCOUNTER — Encounter: Payer: Self-pay | Admitting: Adult Health

## 2017-10-12 ENCOUNTER — Non-Acute Institutional Stay (SKILLED_NURSING_FACILITY): Payer: Medicare Other | Admitting: Adult Health

## 2017-10-12 DIAGNOSIS — L89313 Pressure ulcer of right buttock, stage 3: Secondary | ICD-10-CM

## 2017-10-12 NOTE — Progress Notes (Signed)
Location:   [facility] [place] Room Number: 217A Place of Service:  SNF (31)   CODE STATUS: FULL  Allergies  Allergen Reactions  . Sulfa Antibiotics Other (See Comments)    "I get real achey" Other reaction(s): Muscle Pain, Other (See Comments)  General body aches andpain.     Chief Complaint  Patient presents with  . 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 The per-wound area does not appear to be inflamed; there are no signs of infection present. The wound bed does have slough present. There are no reports of fevers present. She is unable to participate in the hpi or ros.   Past Medical History:  Diagnosis Date  . Arthritis   . Cancer (HCC)    diffuse large B cell lymphoma  . Cancer ([place])    uterine  . Depression   . Diabetes mellitus without complication ([place])   . Dysrhythmia    A-fib  . Hypercholesteremia   . Hypertension   . Stroke [facility])     Past Surgical History:  Procedure Laterality Date  . ABDOMINAL HYSTERECTOMY    . APPENDECTOMY    . BUNIONECTOMY Bilateral   . FRACTURE SURGERY Right    ankle  . MASS EXCISION N/A 09/28/2015   Procedure: EXCISION OF 2 CYSTIC MASSES FROM MEDIAL STERNUM;  Surgeon: Dereck Leep, MD;  Location: [facility] ORS;  Service: Orthopedics;  Laterality: N/A;  . rheumatoid nodual removal    . TUBAL LIGATION      Social History   Socioeconomic History  . Marital status: Married    Spouse name: Not on file  . Number of children: Not on file  . Years of education: Not on file  . Highest education level: Not on file  Occupational History  . Not on file  Social Needs  . Financial resource strain: Not on file  . Food insecurity:    Worry: Not on file    Inability: Not on file  . Transportation needs:    Medical: Not on file    Non-medical: Not on file  Tobacco Use  . Smoking status: Former Smoker    Last attempt to quit: 09/12/2007    Years since  quitting: 10.0  . Smokeless tobacco: Never Used  Substance and Sexual Activity  . Alcohol use: Yes    Comment: 1-2 glass of wine a month.  . Drug use: No  . Sexual activity: Not Currently  Lifestyle  . Physical activity:    Days per week: Not on file    Minutes per session: Not on file  . Stress: Not on file  Relationships  . Social connections:    Talks on phone: Not on file    Gets together: Not on file    Attends religious service: Not on file    Active member of club or organization: Not on file    Attends meetings of clubs or organizations: Not on file    Relationship status: Not on file  . Intimate partner violence:    Fear of current or ex partner: Not on file    Emotionally abused: Not on file    Physically abused: Not on file    Forced sexual activity: Not on file  Other Topics Concern  . Not on file  Social History Narrative  . Not on file   History reviewed. No pertinent family history.  VITAL SIGNS BP 126/72   Pulse 86   Temp (!) 97.2 F (36.2 C) (Oral)   Resp 20   Ht _0  (1.702 m)   Wt 138 lb 9.6 oz (62.9 kg)   SpO2 96%   BMI 21.71 kg/m   Outpatient Encounter Medications as of 10/12/2017  Medication Sig  . albuterol (PROVENTIL) (2.5 MG/3ML) 0.083% nebulizer solution Take 2.5 mg by nebulization every 6 (six) hours as needed for wheezing or shortness of breath.  Marland Kitchen aspirin EC 81 MG tablet Take 1 tablet (81 mg total) by mouth daily.  Marland Kitchen atorvastatin (LIPITOR) 40 MG tablet Take 40 mg by mouth daily.  . carboxymethylcellulose (REFRESH PLUS) 0.5 % SOLN Place 2 drops into both eyes 3 (three) times daily as needed (dry eyes).  . collagenase (SANTYL) ointment Apply 1 application topically daily. Apply to R buttock wound daily and cover with non adherant dressing. Per NP Debbie on 10-12-2017  . diltiazem (CARDIZEM CD) 120 MG 24 hr capsule Take 120 mg by mouth daily.  Marland Kitchen donepezil (ARICEPT) 5 MG tablet Take 5 mg by mouth every evening.  . ferrous sulfate 325  (65 FE) MG tablet Take 325 mg by mouth 2 (two) times daily.  Marland Kitchen glipiZIDE (GLUCOTROL XL) 5 MG 24 hr tablet Take 5 mg by mouth daily.   Marland Kitchen guaiFENesin (MUCINEX) 600 MG 12 hr tablet Take 1 tablet (600 mg total) by mouth 2 (two) times daily for 7 days.  . [employer] [facility] EX) Apply liberal amount to area of skin irritation prn. OK to leave at bedside.  Marland Kitchen ipratropium-albuterol (DUONEB) 0.5-2.5 (3) MG/3ML SOLN Take 3 mLs by nebulization every 6 (six) hours as needed.  . magnesium oxide (MAG-OX) 400 MG tablet Take 800 mg by mouth daily.  . Melatonin 3 MG TABS Take 3 mg by mouth at bedtime as needed (sleep).  . metFORMIN (GLUCOPHAGE) 500 MG tablet Take 500 mg by mouth 2 (two) times daily.   Marland Kitchen oxyCODONE (OXY IR/ROXICODONE) 5 MG immediate release tablet Take 0.5 tablets (2.5 mg total) by mouth every 4 (four) hours as needed for severe pain.  . OXYGEN Inhale 3 L/min into the lungs continuous.  . polyethylene glycol (MIRALAX / GLYCOLAX) packet Take 17 g by mouth daily.   . potassium chloride (K-DUR,KLOR-CON) 10 MEQ tablet Take 10 mEq by mouth daily.  Marland Kitchen torsemide (DEMADEX) 10 MG tablet Take 10 mg by mouth daily.  . vitamin C (ASCORBIC ACID) 500 MG tablet Take 500 mg by mouth 2 (two) times daily. (take with ferrous sulfate tablets)   No facility-administered encounter medications on file as of 10/12/2017.      SIGNIFICANT DIAGNOSTIC EXAMS  PREVIOUS:   09-23-17: ct of head: No acute abnormality. Atrophy and chronic microvascular ischemic change. Sinus disease.  09-23-17:  Ct angio of chest; 1. Moderate RIGHT and small LEFT pleural effusions. Bibasilar atelectasis and/or pneumonia. 2. Bronchial wall thickening seen with bronchitis. Scattered tree-in-bud infiltrates may be infectious or inflammatory. 3. Partially imaged biliary stent with pneumobilia and debris within gallbladder fossa. Aortic Atherosclerosis (ICD10-I70.0) and Emphysema (ICD10-J43.9).   09-24-17: ct of abdomen and pelvis: 1.  Small amount of perihepatic and pelvic ascites. Attenuation above simple fluid suggests blood, contrast material, or proteinaceous component. 2. Stable placement of patent metallic biliary stent. 3. Fecal distention of the rectum with regional inflammatory/edematous changes, suggesting possible stercoral proctitis. 4. Sigmoid diverticulosis. 5. Lower thoracic and lumbar mild compression deformities, age indeterminate 6. Ectatic abdominal aorta at risk for  aneurysm development. Recommend followup by ultrasound in 5 years.    09-29-17: chest x-ray: Worsening atelectasis or pneumonia at the right lung base with small right pleural effusion. Underlying COPD. No CHF.  NO NEW EXAMS.    LABS REVIEWED: PREVIOUS:   09-21-17: wbc 5.6; hgb 9.0; hct 27.8; mcv 94.;7 plt 141;glucose 256; bun 20; creat 0.70; k+ 3.2; na++ 142; ca 8.5; alk phos 140; total bili 2.2; albumin 2.8; tsh 3.450; vit D 30.7; vit B 12: 603 09-23-17: wbc 6.8; hgb 8.4; hct 26.1; mcv 96.5; plt 163; glucose 313; bun 20; creat 0.70; k+ 4.8; na++ 142; ca 8.2; alk phos 132; total bili 2.2; albumin 2.9; blood/urine culture: no growth BNP 1095; mag 1.9; phos 2.3 09-25-17: glucose 199; bun 17; creat 0.65; k+ 3.7; na++ 140; ca 7.6; alk hpos 111; total bili 1.7; albumin 2.3 09-28-17: glucose 211; bun 23; creat 0.46; k+ 3.5; na++ 138; ca 7.9 09-30-17: wbc 6.3; hgb 11.8; hct 36.4; mcv 92.9; plt 162; glucose 117; bun 26; creat 0.56; k+ 4.8; na++ 139; ca 8.6; phos 3.0; pre-albumin 10.3  10-06-17: glucose 265; bun 30; creat 0.91; k+ 4.1; na++ 140; ca 9.4; phos 2.7; mag 2.0   NO NEW LABS.    Review of Systems  Unable to perform ROS: Other (lethergy )    Physical Exam  Constitutional: She appears well-developed and well-nourished. No distress.  Neck: No thyromegaly present.  Cardiovascular: Normal rate, regular rhythm, normal heart sounds and intact distal pulses.  Pulmonary/Chest: Effort normal. No respiratory distress.  02 dependent Breath sounds  diminished   Abdominal: Soft. Bowel sounds are normal. She exhibits no distension. There is no tenderness.  Musculoskeletal: She exhibits edema.  Has bilateral upper extremity edema with mild  weeping present  Is able to move extremities is very weak    Lymphadenopathy:    She has no cervical adenopathy.  Neurological:  Is awake; lethargic   Skin: Skin is warm and dry. She is not diaphoretic.  Right inner buttock stage 3 ulceration has slough present. Peri-wound area without signs of infection present.  Abdominal incision line without signs of infection present Has bruising on the right side of abdomen.      ASSESSMENT/ PLAN:  TODAY:   1. 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      MD is aware of resident's narcotic use and is in agreement with current plan of care. We will attempt to wean resident as apropriate   Ok Edwards NP [facility]  Contact [phone] Monday through Friday 8am- 5pm  After hours call [phone]

## 2017-10-15 ENCOUNTER — Non-Acute Institutional Stay (SKILLED_NURSING_FACILITY): Payer: Medicare Other | Admitting: Adult Health

## 2017-10-15 ENCOUNTER — Encounter: Payer: Self-pay | Admitting: Adult Health

## 2017-10-15 DIAGNOSIS — I10 Essential (primary) hypertension: Secondary | ICD-10-CM

## 2017-10-15 DIAGNOSIS — J9611 Chronic respiratory failure with hypoxia: Secondary | ICD-10-CM | POA: Diagnosis not present

## 2017-10-15 DIAGNOSIS — E1140 Type 2 diabetes mellitus with diabetic neuropathy, unspecified: Secondary | ICD-10-CM

## 2017-10-15 DIAGNOSIS — I482 Chronic atrial fibrillation, unspecified: Secondary | ICD-10-CM

## 2017-10-15 DIAGNOSIS — F0150 Vascular dementia without behavioral disturbance: Secondary | ICD-10-CM

## 2017-10-15 DIAGNOSIS — Z8673 Personal history of transient ischemic attack (TIA), and cerebral infarction without residual deficits: Secondary | ICD-10-CM

## 2017-10-15 NOTE — Progress Notes (Signed)
Location:  [facility] Number: Outlook:  SNF [phone]) Provider:  Durenda Age, NP  Patient Care Team: Derinda Late, MD as PCP - General (Family Medicine)  Extended Emergency Contact Information Primary Emergency Contact: [facility] R Address: [address] [place], [place] [address] Johnnette Litter of [place] Phone: [phone] Mobile Phone: [phone] Relation: Spouse Secondary Emergency Contact: Sheppard [place] of [place] Phone: [phone] Relation: Daughter  Code Status:  Full Code  Goals of care: Advanced Directive information Advanced Directives 10/12/2017  Does Patient Have a Medical Advance Directive? No  Type of Advance Directive -  Does patient want to make changes to medical advance directive? No - Patient declined  Copy of [place] in Chart? -  Would patient like information on creating a medical advance directive? -     Chief Complaint  Patient presents with  . Medical Management of Chronic Issues    The patient is seen for a routine [facility] visit     HPI:  Pt is a 72 y.o. female seen today for medical management of chronic issues.  She is a short-term care resident of [facility].  She has a PMH of diffuse large B cell lymphoma, uterine cancer, diabetes mellitus, atrial fibrillation, hypertension, vascular dementia without behavioral disturbance, and stroke. She was reported to have elevated CBGs  168, 324, 298, 297, 238. She is currently taking Metfrom 500 mg BID and Glipizide ER 5mg  daily for DM. Staff reported that she is needing extensive assistance in ADL and needing to be fed.  She has been [facility] to [facility] on 09/18[facility]17 from a recent hospitalization due to acute on chronic hypoxic respiratory failure needing secondary to right lower lobe pneumonia. She was given IV Zosyn and has completed oral  antibiotics. She was transfused 1 unit PRBC due to anemia. Eliquis was discontinued given her high fall risk and risk for bleeding.     Past Medical History:  Diagnosis Date  . Arthritis   . Cancer (HCC)    diffuse large B cell lymphoma  . Cancer ([place])    uterine  . Depression   . Diabetes mellitus without complication ([place])   . Dysrhythmia    A-fib  . Hypercholesteremia   . Hypertension   . Stroke [facility])    Past Surgical History:  Procedure Laterality Date  . ABDOMINAL HYSTERECTOMY    . APPENDECTOMY    . BUNIONECTOMY Bilateral   . FRACTURE SURGERY Right    ankle  . MASS EXCISION N/A 09/28/2015   Procedure: EXCISION OF 2 CYSTIC MASSES FROM MEDIAL STERNUM;  Surgeon: Dereck Leep, MD;  Location: [facility] ORS;  Service: Orthopedics;  Laterality: N/A;  . rheumatoid nodual removal    . TUBAL LIGATION      Allergies  Allergen Reactions  . Sulfa Antibiotics Other (See Comments)    "I get real achey" Other reaction(s): Muscle Pain, Other (See Comments)  General body aches andpain.     Outpatient Encounter Medications as of 10/15/2017  Medication Sig  . albuterol (PROVENTIL) (2.5 MG/3ML) 0.083% nebulizer solution Take 2.5 mg by nebulization every 6 (six) hours as needed for wheezing or shortness of breath.  Marland Kitchen aspirin EC 81 MG tablet Take 1 tablet (81 mg total) by mouth daily.  Marland Kitchen atorvastatin (LIPITOR) 40 MG tablet Take 40 mg by mouth daily.  Marland Kitchen  carboxymethylcellulose (REFRESH PLUS) 0.5 % SOLN Place 2 drops into both eyes 3 (three) times daily as needed (dry eyes).  . collagenase (SANTYL) ointment Apply 1 application topically daily. Apply to R buttock wound daily and cover with non adherant dressing. Per NP Debbie on 10-12-2017  . diltiazem (CARDIZEM CD) 120 MG 24 hr capsule Take 120 mg by mouth daily.  Marland Kitchen donepezil (ARICEPT) 5 MG tablet Take 5 mg by mouth every evening.  . ferrous sulfate 325 (65 FE) MG tablet Take 325 mg by mouth 2 (two) times daily.  Marland Kitchen glipiZIDE (GLUCOTROL  XL) 5 MG 24 hr tablet Take 5 mg by mouth daily.   . Infant Care Products [facility] EX) Apply liberal amount to area of skin irritation prn. OK to leave at bedside.  Marland Kitchen ipratropium-albuterol (DUONEB) 0.5-2.5 (3) MG/3ML SOLN Take 3 mLs by nebulization every 6 (six) hours as needed.  . magnesium oxide (MAG-OX) 400 MG tablet Take 800 mg by mouth daily.  . Melatonin 3 MG TABS Take 3 mg by mouth at bedtime as needed (sleep).  . metFORMIN (GLUCOPHAGE) 500 MG tablet Take 500 mg by mouth 2 (two) times daily.   Marland Kitchen oxyCODONE (OXY IR/ROXICODONE) 5 MG immediate release tablet Take 0.5 tablets (2.5 mg total) by mouth every 4 (four) hours as needed for severe pain.  . OXYGEN Inhale 3 L/min into the lungs continuous. To maintain O2 sats greater than 92%  . polyethylene glycol (MIRALAX / GLYCOLAX) packet Take 17 g by mouth daily.   . potassium chloride (K-DUR,KLOR-CON) 10 MEQ tablet Take 10 mEq by mouth daily.  . protein supplement (PROMOD) POWD Take 1 scoop by mouth 2 (two) times daily between meals.  . torsemide (DEMADEX) 10 MG tablet Take 10 mg by mouth daily.  . vitamin C (ASCORBIC ACID) 500 MG tablet Take 500 mg by mouth 2 (two) times daily. (take with ferrous sulfate tablets)   No facility-administered encounter medications on file as of 10/15/2017.     Review of Systems  Unable to obtain due to vascular dementia    Immunization History  Administered Date(s) Administered  . DTaP / Hep B / IPV 09/25/2014, 12/01/2014, 02/02/2015  . HiB (PRP-T) 09/25/2014, 12/01/2014, 02/02/2015  . Influenza,inj,Quad PF,6+ Mos 01/14/2017  . Influenza-Unspecified 12/31/2012, 01/17/2014, 03/04/2015, 01/18/2016  . Pneumococcal Conjugate-13 05/25/2013, 09/25/2014, 12/01/2014, 02/02/2015, 01/16/2016, 01/22/2016  . Pneumococcal Polysaccharide-23 06/15/2017   Pertinent  Health Maintenance Due  Topic Date Due  . HEMOGLOBIN A1C  09[birth year]  . FOOT EXAM  06/11/1955  . OPHTHALMOLOGY EXAM  06/11/1955  . URINE MICROALBUMIN   06/11/1955  . MAMMOGRAM  10/08/2018 (Originally 06/11/1994)  . DEXA SCAN  10/08/2018 (Originally 06/10/2009)  . COLONOSCOPY  10/08/2018 (Originally 06/11/1994)  . INFLUENZA VACCINE  11/05/2017  . PNA vac Low Risk Adult  Completed      Vitals:   10/15/17 0949 10/15/17 1058  BP: (!) 104/58 126/72  Pulse: 60   Resp: 18   Temp: 98 F (36.7 C)   TempSrc: Oral   SpO2: 96%   Weight: 138 lb 9.6 oz (62.9 kg)   Height: 5\' 7"  (1.702 m)    Body mass index is 21.71 kg/m.  Physical Exam  GENERAL APPEARANCE: Well nourished. In no acute distress. Normal body habitus MOUTH and THROAT: Lips are without lesions. Oral mucosa is moist and without lesions.  RESPIRATORY: Breathing is even & unlabored, BS CTAB, O2 @ 3L/min via [place] CARDIAC: Irregularly irregular, no murmur,no extra heart sounds, no edema GI: Abdomen soft,  normal BS, no masses, no tenderness EXTREMITIES:  Able to move LUE, does not move RUE and BLE PSYCHIATRIC: Affect and behavior are appropriate  Labs reviewed: Recent Labs    10/02/17 0738 10/04/17 0449 10/06/17 1018  NA 139 141 140  K 4.4 4.5 4.1  CL 98 98 99  CO2 34* 36* 34*  GLUCOSE 212* 207* 265*  BUN 40* 29* 30*  CREATININE 0.83 0.67 0.91  CALCIUM 8.7* 9.2 9.4  MG 2.0 2.0 2.0  PHOS 3.5 2.9 2.7   Recent Labs    09/21/17 1254 09/23/17 1323 09/25/17 0513  AST 30 39 32  ALT 24 24 18   ALKPHOS 145* 132* 111  BILITOT 2.2* 2.2* 1.7*  PROT 5.8* 5.8* 4.8*  ALBUMIN 2.8* 2.9* 2.3*   Recent Labs    09/23/17 1323  09/26/17 0746 09/29/17 0445 09/30/17 0532 10/02/17 0738  WBC 6.8   < > 4.6 5.3 6.3 9.6  NEUTROABS 5.2  --  3.1 3.1  --   --   HGB 8.4*   < > 9.7* 11.8* 11.8* 11.1*  HCT 26.1*   < > 29.4* 35.8 36.4 32.5*  MCV 96.5   < > 92.7 93.5 92.9 92.9  PLT 163   < > 108* 139* 162 220   < > = values in this interval not displayed.   Lab Results  Component Value Date   TSH 3.450 09/21/2017    Lab Results  Component Value Date   CHOL 160 07/14/2012   HDL 40  07/14/2012   LDLCALC 91 07/14/2012   TRIG 143 07/14/2012    Significant Diagnostic Results in last 30 days:  Ct Abdomen Pelvis Wo Contrast  Result Date: 09/24/2017 CLINICAL DATA:  S/P ERCP and stent for stone removal. Now with persistent lactic acidosis. PO contrast only pe chest done yesterday. EXAM: CT ABDOMEN AND PELVIS WITHOUT CONTRAST TECHNIQUE: Multidetector CT imaging of the abdomen and pelvis was performed following the standard protocol without IV contrast. COMPARISON:  CT chest from previous day, PET-CT 11/16/2012, and earlier studies FINDINGS: Lower chest: Bilateral pleural effusions right greater than left as before. Adjacent consolidation/atelectasis posteriorly in the lower lobes. Scattered subpleural patchy somewhat nodular opacities in the visualized lung bases as before. Hepatobiliary: Metallic CBD stent. Gas in the intrahepatic biliary tree implying stent patency. Cholecystectomy clips. No discrete liver lesion. Pancreas: Scattered coarse calcifications in the pancreatic head near the stent. No mass or ductal dilatation. Spleen: Normal in size without focal abnormality. Adrenals/Urinary Tract: Unremarkable adrenals. Residual contrast in the renal collecting systems without hydronephrosis. No focal renal lesion is evident. Foley catheter partially decompresses the urinary bladder. Stomach/Bowel: Stomach is nondilated. Incomplete distal passage of oral contrast material. Small bowel grossly unremarkable. Multiple sigmoid diverticula. Fecal material distends the rectum. There are mild perirectal and presacral edematous/inflammatory changes. Vascular/Lymphatic: Aortoiliac atherosclerosis (ICD10-170.0). Infrarenal aorta 2.6 cm diameter. Tortuous venous collateral channels in the left retroperitoneum associated with the left renal vein, present on studies dating back to 05/22/2007. No abdominal or pelvic adenopathy localized. Reproductive: Status post hysterectomy. No adnexal masses. Other: There  is a small amount of high attenuation perihepatic and pelvic ascites. No free air. Musculoskeletal: Superior endplate compression deformities of T11, L1, L2, and L3, age indeterminate. No definite acute fracture or worrisome bone lesion. IMPRESSION: 1. Small amount of perihepatic and pelvic ascites. Attenuation above simple fluid suggests blood, contrast material, or proteinaceous component. 2. Stable placement of patent metallic biliary stent. 3. Fecal distention of the rectum with regional inflammatory/edematous  changes, suggesting possible stercoral proctitis. 4. Sigmoid diverticulosis. 5. Lower thoracic and lumbar mild compression deformities, age indeterminate 6. Ectatic abdominal aorta at risk for aneurysm development. Recommend followup by ultrasound in 5 years. This recommendation follows ACR consensus guidelines: White Paper of the ACR Incidental Findings Committee II on Vascular Findings. J Am Coll Radiol 2013; 10:789-794. Electronically Signed   By: Lucrezia Europe M.D.   On: 09/24/2017 12:07   Dg Chest 1 View  Result Date: 09/30/2017 CLINICAL DATA:  Hypoxia EXAM: CHEST  1 VIEW COMPARISON:  09/29/2017 FINDINGS: Cardiac shadow is stable. Aortic calcifications are again seen. Feeding catheter is noted coiled within the stomach stable from the prior exam. The lungs are well aerated bilaterally. Mild interstitial changes are seen with persistent right basilar atelectasis and small effusion. No bony abnormality is noted. IMPRESSION: Stable appearance of the chest from the previous day. Electronically Signed   By: Inez Catalina M.D.   On: 09/30/2017 08:20   Dg Chest 1 View  Result Date: 09/29/2017 CLINICAL DATA:  Hypoxia, shortness of breath, and weakness. Limited ability to give history. History of lymphoma, uterine malignancy, diabetes, and previous CVA. Former smoker. EXAM: CHEST  1 VIEW COMPARISON:  Portable chest x-ray of September 25, 2017 FINDINGS: The lungs are well-expanded. There is increased density at  the right lung base with partial obscuration of the hemidiaphragm. A small right pleural effusion is suspected. The left lung exhibits no infiltrate or effusion. The interstitial markings of both lungs are coarse. The heart and pulmonary vascularity are normal. A feeding tube is present with the radiodense tube tip projecting in the region of the gastric cardia with some coiling of the tube more inferiorly in the gastric body. IMPRESSION: Worsening atelectasis or pneumonia at the right lung base with small right pleural effusion. Underlying COPD. No CHF. Electronically Signed   By: David  Martinique M.D.   On: 09/29/2017 14:22   Dg Abd 1 View  Result Date: 09/23/2017 CLINICAL DATA:  72 year old female NG tube placement. EXAM: ABDOMEN - 1 VIEW COMPARISON:  Portable chest 1310 hours today. FINDINGS: Portable AP semi upright view at 1930 hours. Enteric tube courses to the left upper quadrant, tip at the level of the gastric body. The side hole is at the level of the gastroesophageal junction and could be advanced 5 centimeters for more optimal placement. Stable lung bases. Negative visible bowel gas pattern. There is a midline metallic stent projecting over the lower lumbar spine. No acute osseous abnormality identified. IMPRESSION: NG tube tip in the proximal stomach. Advance the tube 5 centimeters to ensure side hole placement inside the stomach. Electronically Signed   By: Genevie Ann M.D.   On: 09/23/2017 19:59   Ct Head Wo Contrast  Result Date: 09/23/2017 CLINICAL DATA:  Patient found unresponsive yesterday. EXAM: CT HEAD WITHOUT CONTRAST TECHNIQUE: Contiguous axial images were obtained from the base of the skull through the vertex without intravenous contrast. COMPARISON:  Brain MRI 08/21/2017. FINDINGS: Brain: No evidence of acute infarction, hemorrhage, hydrocephalus, extra-axial collection or mass lesion/mass effect. Cortical atrophy and chronic microvascular ischemic change are noted. Remote lacunar  infarction left caudate head is identified. Vascular: No hyperdense vessel or unexpected calcification. Skull: Intact.  No focal lesion. Sinuses/Orbits: There is fairly extensive ethmoid air cell disease and marked mucosal thickening in the left maxillary sinus. Mild mucosal thickening right sphenoid sinus is present. The left frontal sinus is opacified. Other: None. IMPRESSION: No acute abnormality. Atrophy and chronic microvascular ischemic change. Sinus  disease. Electronically Signed   By: Inge Rise M.D.   On: 09/23/2017 14:30   Ct Angio Chest Pe W Or Wo Contrast  Result Date: 09/23/2017 CLINICAL DATA:  Acute respiratory failure. Recent exploratory laparotomy and cholecystectomy. History of lymphoma and uterine cancer. EXAM: CT ANGIOGRAPHY CHEST WITH CONTRAST TECHNIQUE: Multidetector CT imaging of the chest was performed using the standard protocol during bolus administration of intravenous contrast. Multiplanar CT image reconstructions and MIPs were obtained to evaluate the vascular anatomy. CONTRAST:  71mL ISOVUE-370 IOPAMIDOL (ISOVUE-370) INJECTION 76% COMPARISON:  Chest radiograph September 23, 2017 FINDINGS: CARDIOVASCULAR: Adequate contrast opacification of the pulmonary artery's. Main pulmonary artery is not enlarged. No pulmonary arterial filling defects to the level of the subsegmental branches. Heart size is normal, no right heart strain. Mild coronary artery calcifications. Trace pericardial effusion. Thoracic aorta is normal course and caliber, mild calcific atherosclerosis. MEDIASTINUM/NODES: No lymphadenopathy by CT size criteria. LUNGS/PLEURA: Moderate RIGHT and small LEFT pleural effusions. Bilateral lower lobe compressive atelectasis versus pneumonia. Scattered clusters of bilateral tree-in-bud infiltrates. Bronchial wall thickening. Endotracheal tube tip above the carina. Mild centrilobular emphysema. No pneumothorax. UPPER ABDOMEN: Partially imaged biliary drain with pneumobilia and  debris within surgical bed. MUSCULOSKELETAL: Thickened RIGHT platysma and subcutaneous fat stranding incompletely imaged. Mild T11, mild L1 and mild-to-moderate L2 old compression fractures. Mild spondylosis. Review of the MIP images confirms the above findings. IMPRESSION: 1. Moderate RIGHT and small LEFT pleural effusions. Bibasilar atelectasis and/or pneumonia. 2. Bronchial wall thickening seen with bronchitis. Scattered tree-in-bud infiltrates may be infectious or inflammatory. 3. Partially imaged biliary stent with pneumobilia and debris within gallbladder fossa. Aortic Atherosclerosis (ICD10-I70.0) and Emphysema (ICD10-J43.9). Electronically Signed   By: Elon Alas M.D.   On: 09/23/2017 22:10   Dg Chest Port 1 View  Result Date: 09/25/2017 CLINICAL DATA:  Respiratory failure. EXAM: PORTABLE CHEST 1 VIEW COMPARISON:  09/24/2017, 09/23/2017.  CT 09/23/2017. FINDINGS: Endotracheal tube and NG tube in stable position. Heart size normal. Mild bibasilar atelectasis. Interim improvement of previously identified small bilateral pleural effusions. No pneumothorax. IMPRESSION: 1.  Lines and tubes in stable position. 2. Mild bibasilar atelectasis. Interim improvement of previously identified small bilateral pleural effusions. Electronically Signed   By: Marcello Moores  Register   On: 09/25/2017 07:14   Dg Chest Port 1 View  Result Date: 09/24/2017 CLINICAL DATA:  Acute respiratory failure EXAM: PORTABLE CHEST 1 VIEW COMPARISON:  Yesterday FINDINGS: Endotracheal tube tip between the clavicular heads and carina. An orogastric tube reaches the stomach. Artifact from EKG leads. Layering pleural effusions with atelectasis. There is hyperinflation. Normal heart size. IMPRESSION: 1. Unremarkable positioning of endotracheal and orogastric tubes. 2. Small bilateral layering pleural effusion. 3. Probable COPD. Electronically Signed   By: Monte Fantasia M.D.   On: 09/24/2017 07:55   Dg Chest Port 1 View  Result Date:  09/23/2017 CLINICAL DATA:  Tube placement. EXAM: PORTABLE CHEST 1 VIEW COMPARISON:  Radiographs of April 10, 2014. FINDINGS: Stable cardiomediastinal silhouette. Interval placement of endotracheal tube with distal tip 6 cm above the carina. No pneumothorax is noted. Mild bibasilar subsegmental atelectasis or edema is noted with small pleural effusions. Bony thorax is unremarkable. IMPRESSION: Endotracheal tube in grossly good position. Nasogastric tube is seen entering stomach. Mild bibasilar subsegmental atelectasis or edema is noted with small pleural effusions. Electronically Signed   By: Marijo Conception, M.D.   On: 09/23/2017 13:55   Dg Abd Portable 1v  Result Date: 09/25/2017 CLINICAL DATA:  Encounter for nasogastric tube placement EXAM:  PORTABLE ABDOMEN - 1 VIEW COMPARISON:  Chest x-ray from earlier today FINDINGS: A nasogastric tube has been replaced by a feeding tube with tip at the fundus. Haziness at the bases from atelectasis and pleural fluid based on prior abdominal CT. Metallic biliary stent in place. Previously seen oral contrast has reached the colon. IMPRESSION: When compared to chest x-ray earlier today the orogastric tube has been replaced by a feeding tube with tip at the gastric fundus. Electronically Signed   By: Monte Fantasia M.D.   On: 09/25/2017 13:22    Assessment/Plan  1. Chronic respiratory failure with hypoxia (HCC) - continue O2 @ 3L/min via [place] continuously, continue Ipratropium-albuterol PRN   2. Type 2 diabetes mellitus with diabetic neuropathy, without long-term current use of insulin (HCC) - CBGs has been elevated, will increase Metformin from 500 mg BID to 1,000 mg BID and continue Glipizide ER 5 mg daily   3. Chronic a-fib (HCC) - rate-controlled, Eliquis was discontinued due to high risk for bleeding and falls, continue Diltiazem 24HR 120 mg daily   4. Essential hypertension - well-controlled, continue Diltiazem 24HR 120 mg daily and Torsemide 10 mg  daily   5. Vascular dementia - continue Donepezil 5 mg daily   6. History of stroke - not on any anticoagulant due to risk for falls and bleeding, will decreased Oxycodone 5mg  give 1/2 tab = 2.5 mg from Q 4 hours PRN to Q 6 hours PRN   Family/ staff Communication: Discussed plan of care with charge nurse.  Labs/tests ordered:  Urine microalbumin, hgbA1c  Goals of care:  Short-term care   Durenda Age, NP [facility] [phone] (Monday-Friday 8:00 a.m. - 5:00 p.m.) [phone] (after hours)

## 2017-10-16 ENCOUNTER — Other Ambulatory Visit
Admission: RE | Admit: 2017-10-16 | Discharge: 2017-10-16 | Disposition: A | Discharge status: Home / Self Care | Payer: Medicare Other | Source: Ambulatory Visit | Attending: Internal Medicine | Admitting: Internal Medicine

## 2017-10-16 DIAGNOSIS — R410 Disorientation, unspecified: Secondary | ICD-10-CM | POA: Insufficient documentation

## 2017-10-16 DIAGNOSIS — R5381 Other malaise: Secondary | ICD-10-CM | POA: Diagnosis not present

## 2017-10-16 LAB — HEMOGLOBIN A1C
Hgb A1c MFr Bld: 7.1 % — ABNORMAL HIGH (ref 4.8–5.6)
MEAN PLASMA GLUCOSE: 157.07 mg/dL

## 2017-11-05 DEATH — deceased
